# Patient Record
Sex: Female | Born: 1960 | Race: Black or African American | Hispanic: No | Marital: Single | Smoking: Never smoker
Health system: Southern US, Community
[De-identification: ages and names within clinical notes are randomized; demographics above are authoritative.]

## PROBLEM LIST (undated history)

## (undated) ENCOUNTER — Emergency Department (HOSPITAL_COMMUNITY): Payer: Medicaid Other

## (undated) DIAGNOSIS — D649 Anemia, unspecified: Secondary | ICD-10-CM

## (undated) DIAGNOSIS — F259 Schizoaffective disorder, unspecified: Secondary | ICD-10-CM

## (undated) DIAGNOSIS — F411 Generalized anxiety disorder: Secondary | ICD-10-CM

## (undated) DIAGNOSIS — E870 Hyperosmolality and hypernatremia: Secondary | ICD-10-CM

## (undated) DIAGNOSIS — M6281 Muscle weakness (generalized): Secondary | ICD-10-CM

## (undated) DIAGNOSIS — N183 Chronic kidney disease, stage 3 unspecified: Secondary | ICD-10-CM

## (undated) DIAGNOSIS — I10 Essential (primary) hypertension: Secondary | ICD-10-CM

## (undated) HISTORY — DX: Chronic kidney disease, stage 3 unspecified: N18.30

## (undated) HISTORY — DX: Muscle weakness (generalized): M62.81

## (undated) HISTORY — DX: Hyperosmolality and hypernatremia: E87.0

## (undated) HISTORY — DX: Anemia, unspecified: D64.9

## (undated) HISTORY — DX: Chronic kidney disease, stage 3 (moderate): N18.3

## (undated) HISTORY — DX: Generalized anxiety disorder: F41.1

## (undated) HISTORY — PX: NO PAST SURGERIES: SHX2092

---

## 1998-03-29 ENCOUNTER — Other Ambulatory Visit: Admission: RE | Admit: 1998-03-29 | Discharge: 1998-03-29 | Payer: Self-pay | Admitting: Internal Medicine

## 1998-05-10 ENCOUNTER — Other Ambulatory Visit: Admission: RE | Admit: 1998-05-10 | Discharge: 1998-05-10 | Payer: Self-pay | Admitting: Internal Medicine

## 2001-12-14 ENCOUNTER — Emergency Department (HOSPITAL_COMMUNITY): Admission: EM | Admit: 2001-12-14 | Discharge: 2001-12-14 | Payer: Self-pay | Admitting: Emergency Medicine

## 2012-08-22 ENCOUNTER — Encounter (HOSPITAL_COMMUNITY): Payer: Self-pay | Admitting: Emergency Medicine

## 2012-08-22 ENCOUNTER — Emergency Department (HOSPITAL_COMMUNITY)
Admission: EM | Admit: 2012-08-22 | Discharge: 2012-08-22 | Disposition: A | Discharge status: Home / Self Care | Payer: Medicaid Other | Source: Home / Self Care | Attending: Emergency Medicine | Admitting: Emergency Medicine

## 2012-08-22 DIAGNOSIS — H60543 Acute eczematoid otitis externa, bilateral: Secondary | ICD-10-CM

## 2012-08-22 DIAGNOSIS — H6123 Impacted cerumen, bilateral: Secondary | ICD-10-CM

## 2012-08-22 DIAGNOSIS — H60509 Unspecified acute noninfective otitis externa, unspecified ear: Secondary | ICD-10-CM

## 2012-08-22 HISTORY — DX: Schizoaffective disorder, unspecified: F25.9

## 2012-08-22 HISTORY — DX: Essential (primary) hypertension: I10

## 2012-08-22 MED ORDER — TRIAMCINOLONE ACETONIDE 0.1 % EX CREA: TOPICAL_CREAM | Freq: Two times a day (BID) | CUTANEOUS | Status: DC

## 2012-08-22 NOTE — ED Provider Notes (Signed)
History     CSN: [id number]  Arrival date & time 08/22/12  1003   First MD Initiated Contact with Patient 08/22/12 1022      Chief Complaint  Patient presents with  . Cerumen Impaction    (Consider location/radiation/quality/duration/timing/severity/associated sxs/prior treatment) HPI Comments: 52 year old female presents with decreased hearing she states is due to buildup of wax in her ear. This is been going on for one year. She denies earache, sore throat, headache, neck or facial pain, fever or other symptoms. When asked about the scaling on her ear she states that they do itch and she does not know the reason for her ears itching.   Past Medical History  Diagnosis Date  . Hypertension   . Schizoaffective disorder     History reviewed. No pertinent past surgical history.  No family history on file.  History  Substance Use Topics  . Smoking status: Never Smoker   . Smokeless tobacco: Not on file  . Alcohol Use: No    OB History   Grav Para Term Preterm Abortions TAB SAB Ect Mult Living                  Review of Systems  HENT: Negative for ear pain, congestion, sore throat, facial swelling, rhinorrhea, mouth sores, neck pain, postnasal drip, sinus pressure and ear discharge.   Skin: Positive for rash.       As described above in regards to the ears.  All other systems reviewed and are negative.    Allergies  Review of patient's allergies indicates no known allergies.  Home Medications   Current Outpatient Rx  Name  Route  Sig  Dispense  Refill  . hydrochlorothiazide (HYDRODIURIL) 50 MG tablet   Oral   Take 50 mg by mouth daily.         Marland Kitchen lithium 300 MG tablet   Oral   Take 300 mg by mouth 3 (three) times daily.         . risperidone (RISPERDAL) 4 MG tablet   Oral   Take 4 mg by mouth 2 (two) times daily.         Marland Kitchen triamcinolone cream (KENALOG) 0.1 %   Topical   Apply topically 2 (two) times daily. Apply for 2 weeks. May use on face   30  g   0     BP 148/99  Pulse 96  Temp(Src) 98.1 F (36.7 C) (Oral)  SpO2 100%  Physical Exam  Nursing note and vitals reviewed. Constitutional: She is oriented to person, place, and time. She appears well-developed and well-nourished. No distress.  HENT:  Head: Normocephalic and atraumatic.  Nose: Nose normal.  Mouth/Throat: Oropharynx is clear and moist. No oropharyngeal exudate.  Thick, gray, dry scaling and desquamation of the dermis involving the outer ear and  ear canal meatus. Similar well marginated lesions are behind both ears. They occur in no other areas. Unable to visualize the TMs due to epidermal and cerumen  debris in the EACs.   Neck: Normal range of motion. Neck supple.  Pulmonary/Chest: Effort normal.  Musculoskeletal: She exhibits no edema and no tenderness.  Lymphadenopathy:    She has no cervical adenopathy.  Neurological: She is alert and oriented to person, place, and time. She exhibits normal muscle tone.  Skin: Skin is warm and dry. Rash noted.  Rash as described above of the outer ears.  Psychiatric: She has a normal mood and affect.    ED Course  Procedures (  including critical care time)  Labs Reviewed - No data to display No results found.   1. Cerumen impaction, bilateral   2. Eczema of external ear, bilateral       MDM  Irrigated both ears with warm water. After copious amounts of irrigation most of the debris was removed. The patient states she can hear better her ear canals were beginning to become irritated so we stopped the process. The TMs are still not well visualized. Hopefully the triamcinolone cream will help reduce the debris associated with the eczema flaking. She may also use a wax softener to assist in removal of ear wax. Triamcinolone cream applied to the areas of the ear affected by eczema. Follow up with your doctor on your Medicaid card for health maintenance, blood pressure, medications and rash in the  ears.        Hayden Rasmussen, NP 08/22/12 1122

## 2012-08-22 NOTE — ED Provider Notes (Signed)
Medical screening examination/treatment/procedure(s) were performed by non-physician practitioner and as supervising physician I was immediately available for consultation/collaboration.  Nichols Corter, M.D.  Saxon Crosby C Cordarrius Coad, MD 08/22/12 2100 

## 2012-08-22 NOTE — ED Notes (Signed)
Reports wax in ears.  " can barely hear".  Reports this issue has been going on for a year.

## 2013-04-14 ENCOUNTER — Encounter (HOSPITAL_COMMUNITY): Payer: Self-pay | Admitting: Emergency Medicine

## 2013-04-14 ENCOUNTER — Emergency Department (HOSPITAL_COMMUNITY)
Admission: EM | Admit: 2013-04-14 | Discharge: 2013-04-14 | Disposition: A | Discharge status: Home / Self Care | Payer: Medicaid Other | Source: Home / Self Care | Attending: Emergency Medicine | Admitting: Emergency Medicine

## 2013-04-14 DIAGNOSIS — H6120 Impacted cerumen, unspecified ear: Secondary | ICD-10-CM

## 2013-04-14 DIAGNOSIS — H6123 Impacted cerumen, bilateral: Secondary | ICD-10-CM

## 2013-04-14 NOTE — ED Provider Notes (Signed)
CSN: [id number]     Arrival date & time 04/14/13  0845 History   First MD Initiated Contact with Patient 04/14/13 0919     Chief Complaint  Patient presents with  . Cerumen Impaction   (Consider location/radiation/quality/duration/timing/severity/associated sxs/prior Treatment) Patient is a 52 y.o. female presenting with plugged ear sensation. The history is provided by the patient. No language interpreter was used.  Ear Fullness This is a recurrent problem. The problem occurs constantly. Nothing aggravates the symptoms. Nothing relieves the symptoms. She has tried nothing for the symptoms.   Pt complains of ears being full off wax Past Medical History  Diagnosis Date  . Hypertension   . Schizoaffective disorder    History reviewed. No pertinent past surgical history. History reviewed. No pertinent family history. History  Substance Use Topics  . Smoking status: Never Smoker   . Smokeless tobacco: Not on file  . Alcohol Use: No   OB History   Grav Para Term Preterm Abortions TAB SAB Ect Mult Living                 Review of Systems  HENT: Positive for ear pain and hearing loss.   All other systems reviewed and are negative.    Allergies  Review of patient's allergies indicates no known allergies.  Home Medications   Current Outpatient Rx  Name  Route  Sig  Dispense  Refill  . hydrochlorothiazide (HYDRODIURIL) 50 MG tablet   Oral   Take 50 mg by mouth daily.         Marland Kitchen lithium 300 MG tablet   Oral   Take 300 mg by mouth 3 (three) times daily.         . risperidone (RISPERDAL) 4 MG tablet   Oral   Take 4 mg by mouth 2 (two) times daily.         Marland Kitchen triamcinolone cream (KENALOG) 0.1 %   Topical   Apply topically 2 (two) times daily. Apply for 2 weeks. May use on face   30 g   0    BP 156/113  Pulse 96  Temp(Src) 98.6 F (37 C) (Oral)  Resp 16  SpO2 98% Physical Exam  Nursing note and vitals reviewed. Constitutional: She is oriented to person,  place, and time. She appears well-developed and well-nourished.  HENT:  Head: Normocephalic and atraumatic.  bilat Tm's occluded with wax  Eyes: Pupils are equal, round, and reactive to light.  Neck: Normal range of motion.  Pulmonary/Chest: Effort normal.  Musculoskeletal: Normal range of motion.  Neurological: She is alert and oriented to person, place, and time.  Skin: Skin is warm.  Psychiatric: She has a normal mood and affect.    ED Course  Procedures (including critical care time) Labs Review Labs Reviewed - No data to display Imaging Review No results found.  EKG Interpretation     Ventricular Rate:    PR Interval:    QRS Duration:   QT Interval:    QTC Calculation:   R Axis:     Text Interpretation:              MDM   1. Cerumen impaction, bilateral    Pt feels better and can hear after irrigation,   Tm's clear   Elson Areas, PA-C 04/14/13 [address] [place], [place] 04/14/13 1046

## 2013-04-14 NOTE — ED Notes (Signed)
C/o bilateral cerumen impaction

## 2013-04-16 NOTE — ED Provider Notes (Signed)
Medical screening examination/treatment/procedure(s) were performed by non-physician practitioner and as supervising physician I was immediately available for consultation/collaboration.  Leslee Home, M.D.  Reuben Likes, MD 04/16/13 1136

## 2013-09-25 ENCOUNTER — Encounter (HOSPITAL_COMMUNITY): Payer: Self-pay | Admitting: Emergency Medicine

## 2013-09-25 ENCOUNTER — Inpatient Hospital Stay (HOSPITAL_COMMUNITY)
Admission: EM | Admit: 2013-09-25 | Discharge: 2013-09-28 | DRG: 871 | Disposition: A | Discharge status: Home / Self Care | Payer: PRIVATE HEALTH INSURANCE | Attending: Internal Medicine | Admitting: Internal Medicine

## 2013-09-25 ENCOUNTER — Emergency Department (HOSPITAL_COMMUNITY): Payer: PRIVATE HEALTH INSURANCE

## 2013-09-25 DIAGNOSIS — I10 Essential (primary) hypertension: Secondary | ICD-10-CM | POA: Diagnosis present

## 2013-09-25 DIAGNOSIS — R631 Polydipsia: Secondary | ICD-10-CM | POA: Diagnosis present

## 2013-09-25 DIAGNOSIS — T56891A Toxic effect of other metals, accidental (unintentional), initial encounter: Secondary | ICD-10-CM

## 2013-09-25 DIAGNOSIS — N179 Acute kidney failure, unspecified: Secondary | ICD-10-CM | POA: Diagnosis present

## 2013-09-25 DIAGNOSIS — T50995A Adverse effect of other drugs, medicaments and biological substances, initial encounter: Secondary | ICD-10-CM | POA: Diagnosis present

## 2013-09-25 DIAGNOSIS — J90 Pleural effusion, not elsewhere classified: Secondary | ICD-10-CM | POA: Diagnosis present

## 2013-09-25 DIAGNOSIS — R918 Other nonspecific abnormal finding of lung field: Secondary | ICD-10-CM | POA: Diagnosis present

## 2013-09-25 DIAGNOSIS — I129 Hypertensive chronic kidney disease with stage 1 through stage 4 chronic kidney disease, or unspecified chronic kidney disease: Secondary | ICD-10-CM | POA: Diagnosis present

## 2013-09-25 DIAGNOSIS — R7889 Finding of other specified substances, not normally found in blood: Secondary | ICD-10-CM

## 2013-09-25 DIAGNOSIS — N251 Nephrogenic diabetes insipidus: Secondary | ICD-10-CM | POA: Diagnosis present

## 2013-09-25 DIAGNOSIS — T438X5A Adverse effect of other psychotropic drugs, initial encounter: Secondary | ICD-10-CM | POA: Diagnosis present

## 2013-09-25 DIAGNOSIS — Z8249 Family history of ischemic heart disease and other diseases of the circulatory system: Secondary | ICD-10-CM

## 2013-09-25 DIAGNOSIS — R7989 Other specified abnormal findings of blood chemistry: Secondary | ICD-10-CM | POA: Diagnosis present

## 2013-09-25 DIAGNOSIS — N189 Chronic kidney disease, unspecified: Secondary | ICD-10-CM | POA: Diagnosis present

## 2013-09-25 DIAGNOSIS — J189 Pneumonia, unspecified organism: Secondary | ICD-10-CM | POA: Diagnosis present

## 2013-09-25 DIAGNOSIS — R358 Other polyuria: Secondary | ICD-10-CM | POA: Diagnosis present

## 2013-09-25 DIAGNOSIS — R3589 Other polyuria: Secondary | ICD-10-CM | POA: Diagnosis present

## 2013-09-25 DIAGNOSIS — F258 Other schizoaffective disorders: Secondary | ICD-10-CM

## 2013-09-25 DIAGNOSIS — F259 Schizoaffective disorder, unspecified: Secondary | ICD-10-CM | POA: Diagnosis present

## 2013-09-25 DIAGNOSIS — A419 Sepsis, unspecified organism: Principal | ICD-10-CM | POA: Diagnosis present

## 2013-09-25 DIAGNOSIS — Z79899 Other long term (current) drug therapy: Secondary | ICD-10-CM

## 2013-09-25 LAB — COMPREHENSIVE METABOLIC PANEL
ALK PHOS: 126 U/L — AB (ref 39–117)
ALT: 11 U/L (ref 0–35)
AST: 15 U/L (ref 0–37)
Albumin: 4.9 g/dL (ref 3.5–5.2)
BILIRUBIN TOTAL: 0.4 mg/dL (ref 0.3–1.2)
BUN: 12 mg/dL (ref 6–23)
CHLORIDE: 103 meq/L (ref 96–112)
CO2: 27 mEq/L (ref 19–32)
Calcium: 12.2 mg/dL — ABNORMAL HIGH (ref 8.4–10.5)
Creatinine, Ser: 1.35 mg/dL — ABNORMAL HIGH (ref 0.50–1.10)
GFR, EST AFRICAN AMERICAN: 51 mL/min — AB (ref >90)
GFR, EST NON AFRICAN AMERICAN: 44 mL/min — AB (ref >90)
GLUCOSE: 82 mg/dL (ref 70–99)
POTASSIUM: 3.8 meq/L (ref 3.7–5.3)
Sodium: 143 mEq/L (ref 137–147)
TOTAL PROTEIN: 8.9 g/dL — AB (ref 6.0–8.3)

## 2013-09-25 LAB — URINE MICROSCOPIC-ADD ON

## 2013-09-25 LAB — CBC WITH DIFFERENTIAL/PLATELET
Basophils Absolute: 0.1 10*3/uL (ref 0.0–0.1)
Basophils Relative: 0 % (ref 0–1)
EOS ABS: 0.3 10*3/uL (ref 0.0–0.7)
Eosinophils Relative: 2 % (ref 0–5)
HCT: 36 % (ref 36.0–46.0)
HEMOGLOBIN: 11.5 g/dL — AB (ref 12.0–15.0)
LYMPHS ABS: 2.4 10*3/uL (ref 0.7–4.0)
Lymphocytes Relative: 16 % (ref 12–46)
MCH: 25.8 pg — AB (ref 26.0–34.0)
MCHC: 31.9 g/dL (ref 30.0–36.0)
MCV: 80.9 fL (ref 78.0–100.0)
MONOS PCT: 6 % (ref 3–12)
Monocytes Absolute: 0.9 10*3/uL (ref 0.1–1.0)
NEUTROS PCT: 75 % (ref 43–77)
Neutro Abs: 11.1 10*3/uL — ABNORMAL HIGH (ref 1.7–7.7)
Platelets: 355 10*3/uL (ref 150–400)
RBC: 4.45 MIL/uL (ref 3.87–5.11)
RDW: 13.7 % (ref 11.5–15.5)
WBC: 14.8 10*3/uL — ABNORMAL HIGH (ref 4.0–10.5)

## 2013-09-25 LAB — URINALYSIS, ROUTINE W REFLEX MICROSCOPIC
BILIRUBIN URINE: NEGATIVE
Glucose, UA: NEGATIVE mg/dL
HGB URINE DIPSTICK: NEGATIVE
Ketones, ur: NEGATIVE mg/dL
Nitrite: NEGATIVE
PROTEIN: NEGATIVE mg/dL
Specific Gravity, Urine: 1.005 (ref 1.005–1.030)
UROBILINOGEN UA: 0.2 mg/dL (ref 0.0–1.0)
pH: 6.5 (ref 5.0–8.0)

## 2013-09-25 LAB — MAGNESIUM: Magnesium: 2.7 mg/dL — ABNORMAL HIGH (ref 1.5–2.5)

## 2013-09-25 LAB — PHOSPHORUS: Phosphorus: 3.1 mg/dL (ref 2.3–4.6)

## 2013-09-25 LAB — SALICYLATE LEVEL: Salicylate Lvl: <2 mg/dL — ABNORMAL LOW (ref 2.8–20.0)

## 2013-09-25 LAB — LITHIUM LEVEL: Lithium Lvl: 2.3 mEq/L (ref 0.80–1.40)

## 2013-09-25 LAB — I-STAT TROPONIN, ED: Troponin i, poc: 0 ng/mL (ref 0.00–0.08)

## 2013-09-25 LAB — ACETAMINOPHEN LEVEL

## 2013-09-25 MED ORDER — HYDROXYZINE HCL 50 MG PO TABS
50.0000 mg | ORAL_TABLET | Freq: Every day | ORAL | Status: DC
  Administered 2013-09-25 – 2013-09-27 (×3): 50 mg via ORAL
  Filled 2013-09-25 (×4): qty 1

## 2013-09-25 MED ORDER — VANCOMYCIN HCL 500 MG IV SOLR
500.0000 mg | Freq: Two times a day (BID) | INTRAVENOUS | Status: DC
  Administered 2013-09-25 – 2013-09-26 (×2): 500 mg via INTRAVENOUS
  Filled 2013-09-25 (×3): qty 500

## 2013-09-25 MED ORDER — SODIUM CHLORIDE 0.9 % IV BOLUS (SEPSIS)
1000.0000 mL | Freq: Once | INTRAVENOUS | Status: AC
  Administered 2013-09-25: 1000 mL via INTRAVENOUS

## 2013-09-25 MED ORDER — CEFEPIME HCL 1 G IJ SOLR: 1.0000 g | Freq: Two times a day (BID) | INTRAMUSCULAR | Status: DC

## 2013-09-25 MED ORDER — INDAPAMIDE 1.25 MG PO TABS
1.2500 mg | ORAL_TABLET | Freq: Every day | ORAL | Status: DC
  Administered 2013-09-25 – 2013-09-26 (×2): 1.25 mg via ORAL
  Filled 2013-09-25 (×2): qty 1

## 2013-09-25 MED ORDER — SODIUM CHLORIDE 0.9 % IJ SOLN
10.0000 mL | INTRAMUSCULAR | Status: DC | PRN
  Administered 2013-09-26 – 2013-09-27 (×2): 10 mL

## 2013-09-25 MED ORDER — RISPERIDONE 2 MG PO TABS
4.0000 mg | ORAL_TABLET | Freq: Every day | ORAL | Status: DC
  Administered 2013-09-25 – 2013-09-27 (×3): 4 mg via ORAL
  Filled 2013-09-25 (×4): qty 2

## 2013-09-25 MED ORDER — RISPERIDONE 2 MG PO TABS
2.0000 mg | ORAL_TABLET | Freq: Two times a day (BID) | ORAL | Status: DC
  Filled 2013-09-25: qty 3

## 2013-09-25 MED ORDER — DEXTROSE 5 % IV SOLN
1.0000 g | Freq: Two times a day (BID) | INTRAVENOUS | Status: DC
  Administered 2013-09-25 – 2013-09-26 (×2): 1 g via INTRAVENOUS
  Filled 2013-09-25 (×4): qty 1

## 2013-09-25 MED ORDER — HEPARIN SODIUM (PORCINE) 5000 UNIT/ML IJ SOLN
5000.0000 [IU] | Freq: Three times a day (TID) | INTRAMUSCULAR | Status: DC
  Administered 2013-09-25 – 2013-09-28 (×8): 5000 [IU] via SUBCUTANEOUS
  Filled 2013-09-25 (×11): qty 1

## 2013-09-25 MED ORDER — DEXTROSE 5 % IV SOLN: 1.0000 g | Freq: Three times a day (TID) | INTRAVENOUS | Status: DC

## 2013-09-25 MED ORDER — DEXTROSE 5 % IV SOLN
1.0000 g | Freq: Once | INTRAVENOUS | Status: DC
  Filled 2013-09-25: qty 1

## 2013-09-25 MED ORDER — RISPERIDONE 2 MG PO TABS
2.0000 mg | ORAL_TABLET | Freq: Every day | ORAL | Status: DC
  Administered 2013-09-26 – 2013-09-28 (×3): 2 mg via ORAL
  Filled 2013-09-25 (×3): qty 1

## 2013-09-25 MED ORDER — SODIUM CHLORIDE 0.9 % IV SOLN
INTRAVENOUS | Status: DC
  Administered 2013-09-25: 23:00:00 via INTRAVENOUS
  Administered 2013-09-26: 100 mL/h via INTRAVENOUS

## 2013-09-25 MED ORDER — VANCOMYCIN HCL 500 MG IV SOLR: 500.0000 mg | Freq: Two times a day (BID) | INTRAVENOUS | Status: DC

## 2013-09-25 MED ORDER — VANCOMYCIN HCL IN DEXTROSE 1-5 GM/200ML-% IV SOLN: 1000.0000 mg | INTRAVENOUS | Status: DC

## 2013-09-25 NOTE — Progress Notes (Signed)
Peripherally Inserted Central Catheter/Midline Placement  The IV Nurse has discussed with the patient and/or persons authorized to consent for the patient, the purpose of this procedure and the potential benefits and risks involved with this procedure.  The benefits include less needle sticks, lab draws from the catheter and patient may be discharged home with the catheter.  Risks include, but not limited to, infection, bleeding, blood clot (thrombus formation), and puncture of an artery; nerve damage and irregular heat beat.  Alternatives to this procedure were also discussed.  PICC/Midline Placement Documentation        Tara Hurst 09/25/2013, 8:01 PM

## 2013-09-25 NOTE — ED Notes (Signed)
IV Team in the room.

## 2013-09-25 NOTE — ED Provider Notes (Signed)
CSN: [id number]     Arrival date & time 09/25/13  1125 History   This chart was scribed for non-physician practitioner Montine Circle, PA-C, working with Jasper Riling. Alvino Chapel, MD, by Neta Ehlers, ED Scribe. This patient was seen in room [facility]3/[facility]3 and the patient's care was started at 12:43 PM.  First MD Initiated Contact with Patient 09/25/13 1153     Chief Complaint  Patient presents with  . Medical Clearance   The history is provided by the patient. No language interpreter was used.   HPI Comments: Tara Hurst is a 53 y.o. female, with a h/o Schizoaffective disorder, presenting to the Emergency Department for  medical clearance. She was sent from [facility] to have her lithium levels assessed. She reports increased drooling and heavy breathing; she states these are new symptoms. She also reports a cough and rhinorrhea. She denies SI and HI. Additionally, she denies emesis, diarrhea, and constipation.    Past Medical History  Diagnosis Date  . Hypertension   . Schizoaffective disorder    History reviewed. No pertinent past surgical history. No family history on file. History  Substance Use Topics  . Smoking status: Never Smoker   . Smokeless tobacco: Not on file  . Alcohol Use: No   No OB history provided.  Review of Systems  A complete 10 system review of systems was obtained, and all systems were negative except where indicated in the HPI and PE.    Allergies  Review of patient's allergies indicates no known allergies.  Home Medications   Current Outpatient Rx  Name  Route  Sig  Dispense  Refill  . hydrochlorothiazide (HYDRODIURIL) 50 MG tablet   Oral   Take 50 mg by mouth daily.         Marland Kitchen lithium 300 MG tablet   Oral   Take 300 mg by mouth 3 (three) times daily.         . risperidone (RISPERDAL) 4 MG tablet   Oral   Take 4 mg by mouth 2 (two) times daily.         Marland Kitchen triamcinolone cream (KENALOG) 0.1 %   Topical   Apply topically 2 (two) times daily.  Apply for 2 weeks. May use on face   30 g   0    Triage Vitals: BP 146/100  Pulse 98  Temp(Src) 98.7 F (37.1 C) (Oral)  Resp 18  SpO2 98%  Physical Exam  Nursing note and vitals reviewed. Constitutional: She is oriented to person, place, and time. She appears well-developed and well-nourished. No distress.  HENT:  Head: Normocephalic and atraumatic.  Eyes: EOM are normal.  Neck: Neck supple. No tracheal deviation present.  Cardiovascular: Normal rate, regular rhythm and normal heart sounds.  Exam reveals no gallop and no friction rub.   No murmur heard. Pulmonary/Chest: Effort normal and breath sounds normal. No stridor. No respiratory distress. She has no wheezes. She has no rales. She exhibits no tenderness.  Abdominal: She exhibits no distension.  Musculoskeletal: Normal range of motion.  Neurological: She is alert and oriented to person, place, and time.  Skin: Skin is warm and dry.  Psychiatric: Her behavior is normal.  Anxious.     ED Course  Procedures (including critical care time)  DIAGNOSTIC STUDIES: Oxygen Saturation is 98% on room air, normal by my interpretation.    COORDINATION OF CARE:  12:46 PM- Discussed treatment plan with patient, and the patient agreed to the plan.   Labs Review Labs  Reviewed - No data to display Imaging Review Dg Chest 2 View  09/25/2013   CLINICAL DATA:  Chest pain.  EXAM: CHEST  2 VIEW  COMPARISON:  None.  FINDINGS: The heart size and mediastinal contours are within normal limits. Mild area of increased density left lung base superior blunting of left costophrenic angle. Bilateral nipple shadows appreciated. Osseous structures unremarkable.  IMPRESSION: Left lower lobe infiltrate and trace effusion.   Electronically Signed   By: Margaree Mackintosh M.D.   On: 09/25/2013 13:19     EKG Interpretation None      MDM   Final diagnoses:  HCAP (healthcare-associated pneumonia)  Lithium toxicity    Patient with cough and shortness  of breath one month. She also feels like her lithium level is high. Will check labs, chest x-ray, and will reevaluate.  Chest x-ray remarkable for pneumonia. Will treat for HCAP. Discussed patient with Dr. Alvino Chapel, who agrees the patient should be admitted. Will also start some fluids for high lithium level.  3:09 PM Thanks to Dr. Wendee Beavers from [facility], who will admit the patient.  Will start abx and get blood cultures in the ED.  I personally performed the services described in this documentation, which was scribed in my presence. The recorded information has been reviewed and is accurate.    Montine Circle, PA-C 09/25/13 1510

## 2013-09-25 NOTE — ED Notes (Signed)
Per EMS: pt from [facility], feels as if her lithium levels are too high, Denies SI/HI.

## 2013-09-25 NOTE — Progress Notes (Addendum)
Patient received from ED.  Patient appears jittery, slurred speech, anxious because she cares for her mother with whom she lives and no one is at home caring for her at this time.  Patient states that she has trouble with her memory but states she gives her own consent for anything that needs to be done for her and her mother.  She answered all my questions appropriately.  She states she is unsure why she is here except that a person at [facility] sent her to the hospital thinking she may have an elevated lithium level  Multiple attempts were made in the ED to initiate IV therapy without success and a PICC has been ordered which patient is in agreement with and IV Team paged.  Instructed patient to use of call light, not to get out of bed without calling for assistance, and bed alarm activated.

## 2013-09-25 NOTE — H&P (Signed)
[facility] History and Physical  Tara Hurst BMW:[id number] DOB: 03/26/1961 DOA: 09/25/2013  Referring physician: PA: Lorre Munroe PCP: Elyn Peers, MD   Chief Complaint: confirmed PNA on chest x ray  HPI: Tara Hurst is a 53 y.o. female  With history of schizoaffective disorder and hypertension. Patient takes lithium at home and reportedly it was felt the patient's lithium levels were elevated as such she was transferred to the ED for further evaluation and recommendations. On further evaluation patient was found to have pneumonia on chest x-ray. The patient states that for the last 2-3 weeks she has been having shortness of breath with productive cough. Nothing she is aware of makes it better or worse. Since onset symptoms of been persistent and not getting any better.  Given persistence symptoms and confirmed pneumonia on chest x-ray as well as elevated lithium levels were consulted for further evaluation recommendations   Review of Systems:  Unable to accurately assess this patient's poor historian  Past Medical History  Diagnosis Date  . Hypertension   . Schizoaffective disorder    History reviewed. No pertinent past surgical history. Social History:  reports that she has never smoked. She has never used smokeless tobacco. She reports that she does not drink alcohol or use illicit drugs.  No Known Allergies  Family History  Problem Relation Age of Onset  . Heart disease Mother   . Diabetic kidney disease Mother      Prior to Admission medications   Medication Sig Start Date End Date Taking? Authorizing Provider  hydrOXYzine (VISTARIL) 50 MG capsule Take 50 mg by mouth at bedtime.   Yes Historical Provider, MD  indapamide (LOZOL) 1.25 MG tablet Take 1.25 mg by mouth daily.   Yes Historical Provider, MD  lithium 300 MG tablet Take 300-600 mg by mouth 2 (two) times daily. 600mg  in the morning and 300mg  at night   Yes Historical Provider, MD  risperiDONE  (RISPERDAL) 2 MG tablet Take 2-6 mg by mouth 2 (two) times daily.   Yes Historical Provider, MD   Physical Exam: Filed Vitals:   09/25/13 1130  BP: 146/100  Pulse: 98  Temp: 98.7 F (37.1 C)  Resp: 18    BP 146/100  Pulse 98  Temp(Src) 98.7 F (37.1 C) (Oral)  Resp 18  Ht 5\' 4"  (1.626 m)  Wt 69.491 kg (153 lb 3.2 oz)  BMI 26.28 kg/m2  SpO2 98%  General:  Appears calm and comfortable Eyes: PERRL, normal lids, irises & conjunctiva ENT: grossly normal hearing, lips & tongue Neck: no LAD, masses or thyromegaly Cardiovascular: RRR, no m/r/g. No LE edema. Telemetry: SR, no arrhythmias  Respiratory: CTA bilaterally, no w/r/r. Normal respiratory effort. Abdomen: soft, nt, nd Skin: no rash or induration seen on limited exam Musculoskeletal: grossly normal tone BUE/BLE Psychiatric: Mood and affect appropriate Neurologic: No facial asymmetry, moves extremities equally           Labs on Admission:  Basic Metabolic Panel:  Recent Labs Lab 09/25/13 1325  NA 143  K 3.8  CL 103  CO2 27  GLUCOSE 82  BUN 12  CREATININE 1.35*  CALCIUM 12.2*   Liver Function Tests:  Recent Labs Lab 09/25/13 1325  AST 15  ALT 11  ALKPHOS 126*  BILITOT 0.4  PROT 8.9*  ALBUMIN 4.9   No results found for this basename: LIPASE, AMYLASE,  in the last 168 hours No results found for this basename: AMMONIA,  in the last 168 hours CBC:  Recent Labs Lab 09/25/13 1325  WBC 14.8*  NEUTROABS 11.1*  HGB 11.5*  HCT 36.0  MCV 80.9  PLT 355   Cardiac Enzymes: No results found for this basename: CKTOTAL, CKMB, CKMBINDEX, TROPONINI,  in the last 168 hours  BNP (last 3 results) No results found for this basename: PROBNP,  in the last 8760 hours CBG: No results found for this basename: GLUCAP,  in the last 168 hours  Radiological Exams on Admission: Dg Chest 2 View  09/25/2013   CLINICAL DATA:  Chest pain.  EXAM: CHEST  2 VIEW  COMPARISON:  None.  FINDINGS: The heart size and mediastinal  contours are within normal limits. Mild area of increased density left lung base superior blunting of left costophrenic angle. Bilateral nipple shadows appreciated. Osseous structures unremarkable.  IMPRESSION: Left lower lobe infiltrate and trace effusion.   Electronically Signed   By: Margaree Mackintosh M.D.   On: 09/25/2013 13:19    EKG: Independently reviewed. Sinus rhythm with no ST elevations or depressions.  Assessment/Plan Principal Problem:   HCAP (healthcare-associated pneumonia) - Pneumonia order set placed please review for details. -Will cover with broad-spectrum IV (vancomycin and cefepime) antibiotics and treat for HCAP given the patient came from nursing home. - Sputum culture and blood culture - Was diagnosed via 2 view chest x-ray which reported left lower lobe infiltrate and trace effusion.  Active Problems:   Schizoaffective disorder - Stable continue home regimen but hold lithium    Hypertension -Patient is not on any antihypertensive medication based on EMR reviewed. We'll place on heart healthy diet and monitor blood pressures. Consider adding antihypertensive medication should blood pressures remain elevated.    Abnormal lithium level in blood - Lithium level elevated. We'll plan on holding lithium and administering IV fluids. We'll reassess lithium levels next a.m. -Neurochecks every 4 hours   Code Status: full code Family Communication: *None at bedside. Disposition Plan: Med/surg with neurochecks.  Time spent: > 55 minutes  Velvet Bathe [facility] Pager [phone]

## 2013-09-25 NOTE — ED Notes (Signed)
Two IV attempts by Lum Babe and two IV attempts by this RN. Mini lab notified and verbalizes will attempt blood culture collect. IV team paged.

## 2013-09-25 NOTE — ED Notes (Signed)
IV team at bedside 

## 2013-09-25 NOTE — Progress Notes (Addendum)
ANTIBIOTIC CONSULT NOTE - INITIAL  Pharmacy Consult for vancomycin, cefepime Indication: HCAP  No Known Allergies  Patient Measurements: Height: 5\' 4"  (162.6 cm) Weight: 153 lb 3.2 oz (69.491 kg) IBW/kg (Calculated) : 54.7  Vital Signs: Temp: 98.7 F (37.1 C) (04/02 1130) Temp src: Oral (04/02 1130) BP: 146/100 mmHg (04/02 1130) Pulse Rate: 98 (04/02 1130) Intake/Output from previous day:   Intake/Output from this shift:    Labs:  Recent Labs  09/25/13 1325  WBC 14.8*  HGB 11.5*  PLT 355  CREATININE 1.35*   Estimated Creatinine Clearance: 46.6 ml/min (by C-G formula based on Cr of 1.35). No results found for this basename: VANCOTROUGH, VANCOPEAK, VANCORANDOM, GENTTROUGH, GENTPEAK, GENTRANDOM, TOBRATROUGH, TOBRAPEAK, TOBRARND, AMIKACINPEAK, AMIKACINTROU, AMIKACIN,  in the last 72 hours   Microbiology: No results found for this or any previous visit (from the past 720 hour(s)).  Medical History: Past Medical History  Diagnosis Date  . Hypertension   . Schizoaffective disorder     Medications:  Scheduled:   Infusions:  . ceFEPime (MAXIPIME) IV    . sodium chloride    . vancomycin     Assessment: 53 yo presented to ER for medical clearance with hx schizoaffective disorder. She was sent from [facility] to have lithium levels assessed and were found to be supratherapeutic at 2.3. Patient also SOB and was found to have pneumonia. To start vancomycin and cefepime for treatment of HCAP. Baseline labs: afebrile, WBC 14.8, SCr 1.35 with est CrCl 47 ml/min. Note that first doses of antibiotics have not been started yet because of trouble starting IV line - IV team has been paged.  Goal of Therapy:  Vancomycin trough level 15-20 mcg/ml  Plan:  1) Vancomycin 500mg  IV q12   2) Cefepime 1g IV q12 for CrCl 30-60 3) Will follow up when IV access started and retime doses as appropriate   Adrian Saran, PharmD, BCPS Pager [phone] 09/25/2013 3:58 PM

## 2013-09-26 ENCOUNTER — Inpatient Hospital Stay (HOSPITAL_COMMUNITY): Payer: PRIVATE HEALTH INSURANCE

## 2013-09-26 DIAGNOSIS — R7989 Other specified abnormal findings of blood chemistry: Secondary | ICD-10-CM

## 2013-09-26 DIAGNOSIS — F259 Schizoaffective disorder, unspecified: Secondary | ICD-10-CM

## 2013-09-26 DIAGNOSIS — I10 Essential (primary) hypertension: Secondary | ICD-10-CM

## 2013-09-26 DIAGNOSIS — T65891A Toxic effect of other specified substances, accidental (unintentional), initial encounter: Secondary | ICD-10-CM

## 2013-09-26 DIAGNOSIS — T56894A Toxic effect of other metals, undetermined, initial encounter: Secondary | ICD-10-CM

## 2013-09-26 LAB — BASIC METABOLIC PANEL
BUN: 15 mg/dL (ref 6–23)
CALCIUM: 11.6 mg/dL — AB (ref 8.4–10.5)
CHLORIDE: 118 meq/L — AB (ref 96–112)
CO2: 29 mEq/L (ref 19–32)
Creatinine, Ser: 1.41 mg/dL — ABNORMAL HIGH (ref 0.50–1.10)
GFR calc Af Amer: 49 mL/min — ABNORMAL LOW (ref >90)
GFR calc non Af Amer: 42 mL/min — ABNORMAL LOW (ref >90)
GLUCOSE: 112 mg/dL — AB (ref 70–99)
POTASSIUM: 4.5 meq/L (ref 3.7–5.3)
SODIUM: 155 meq/L — AB (ref 137–147)

## 2013-09-26 LAB — LEGIONELLA ANTIGEN, URINE: LEGIONELLA ANTIGEN, URINE: NEGATIVE

## 2013-09-26 LAB — CBC
HCT: 31.1 % — ABNORMAL LOW (ref 36.0–46.0)
HEMOGLOBIN: 9.9 g/dL — AB (ref 12.0–15.0)
MCH: 25.6 pg — ABNORMAL LOW (ref 26.0–34.0)
MCHC: 31.8 g/dL (ref 30.0–36.0)
MCV: 80.4 fL (ref 78.0–100.0)
Platelets: 311 10*3/uL (ref 150–400)
RBC: 3.87 MIL/uL (ref 3.87–5.11)
RDW: 13.9 % (ref 11.5–15.5)
WBC: 10.9 10*3/uL — ABNORMAL HIGH (ref 4.0–10.5)

## 2013-09-26 LAB — SODIUM, URINE, RANDOM: Sodium, Ur: 41 meq/L

## 2013-09-26 LAB — HIV ANTIBODY (ROUTINE TESTING W REFLEX): HIV: NONREACTIVE

## 2013-09-26 LAB — STREP PNEUMONIAE URINARY ANTIGEN: Strep Pneumo Urinary Antigen: NEGATIVE

## 2013-09-26 LAB — LITHIUM LEVEL: LITHIUM LVL: 1.54 meq/L — AB (ref 0.80–1.40)

## 2013-09-26 MED ORDER — LEVOFLOXACIN IN D5W 750 MG/150ML IV SOLN
750.0000 mg | INTRAVENOUS | Status: DC
  Administered 2013-09-26: 750 mg via INTRAVENOUS
  Filled 2013-09-26 (×2): qty 150

## 2013-09-26 MED ORDER — DIPHENHYDRAMINE HCL 25 MG PO CAPS
25.0000 mg | ORAL_CAPSULE | Freq: Two times a day (BID) | ORAL | Status: DC | PRN
  Administered 2013-09-26: 25 mg via ORAL
  Filled 2013-09-26: qty 1

## 2013-09-26 MED ORDER — BENZTROPINE MESYLATE 0.5 MG PO TABS
0.5000 mg | ORAL_TABLET | Freq: Two times a day (BID) | ORAL | Status: DC
  Administered 2013-09-26 – 2013-09-28 (×4): 0.5 mg via ORAL
  Filled 2013-09-26 (×5): qty 1

## 2013-09-26 MED ORDER — SODIUM CHLORIDE 0.45 % IV SOLN
INTRAVENOUS | Status: DC
  Administered 2013-09-26 – 2013-09-27 (×2): via INTRAVENOUS

## 2013-09-26 NOTE — Progress Notes (Addendum)
[facility] PROGRESS NOTE  Tara Hurst XVQ:[id number] DOB: Jun 26, 1961 DOA: 09/25/2013 PCP: Elyn Peers, MD  Assessment/Plan:  53 y.o. female with history of schizoaffective disorder and hypertension is admitted with PNA, ? DI  1. Pneumonia/sirs/sepsis/leukocytosis; CXR: Left lower lobe infiltrate and trace effusion -afebrile; cotn IV atx, IVF, oxygen, bronchodilators prn   2. Lithium toxicity suspicious DI; has polyuria, polydipsia; ? Nephrogenic DI with lithium -check urin osm, sodium, serum osm; strict I/O, urine output, ? desmopressin if not response; check renal US -cont IVF  3. AKI on ? CKD with lithium toxicity; cont IVF; f/u labs; hold indapamide;   4. Schizoaffective disorder on lithium, respirdal; consulted psychiatry; we stopped lithium due to DI;  -need medication reevaluation    Code Status: full Family Communication: d/w patient, called updated Dapper,Patricia Mother [phone]  (indicate person spoken with, relationship, and if by phone, the number) Disposition Plan: pend clinical improvement    Consultants:  None   Procedures:  None   Antibiotics:  levofloxacin 4/3<<<<   (indicate start date, and stop date if known)  HPI/Subjective: alert  Objective: Filed Vitals:   09/26/13 0641  BP: 153/92  Pulse: 76  Temp: 97.7 F (36.5 C)  Resp: 20    Intake/Output Summary (Last 24 hours) at 09/26/13 1111 Last data filed at 09/26/13 0814  Gross per 24 hour  Intake 2693.33 ml  Output   4000 ml  Net -1306.67 ml   Filed Weights   09/25/13 1524  Weight: 69.491 kg (153 lb 3.2 oz)    Exam:   General:  alert  Cardiovascular: s1,s2 rrr  Respiratory: CTA BL  Abdomen: soft, nt,nd   Musculoskeletal: no LE edema   Data Reviewed: Basic Metabolic Panel:  Recent Labs Lab 09/25/13 1325 09/25/13 1822 09/26/13 0446  NA 143  --  155*  K 3.8  --  4.5  CL 103  --  118*  CO2 27  --  29  GLUCOSE 82  --  112*  BUN 12  --  15  CREATININE  1.35*  --  1.41*  CALCIUM 12.2*  --  11.6*  MG  --  2.7*  --   PHOS  --  3.1  --    Liver Function Tests:  Recent Labs Lab 09/25/13 1325  AST 15  ALT 11  ALKPHOS 126*  BILITOT 0.4  PROT 8.9*  ALBUMIN 4.9   No results found for this basename: LIPASE, AMYLASE,  in the last 168 hours No results found for this basename: AMMONIA,  in the last 168 hours CBC:  Recent Labs Lab 09/25/13 1325 09/26/13 0446  WBC 14.8* 10.9*  NEUTROABS 11.1*  --   HGB 11.5* 9.9*  HCT 36.0 31.1*  MCV 80.9 80.4  PLT 355 311   Cardiac Enzymes: No results found for this basename: CKTOTAL, CKMB, CKMBINDEX, TROPONINI,  in the last 168 hours BNP (last 3 results) No results found for this basename: PROBNP,  in the last 8760 hours CBG: No results found for this basename: GLUCAP,  in the last 168 hours  Recent Results (from the past 240 hour(s))  CULTURE, BLOOD (ROUTINE X 2)     Status: None   Collection Time    09/25/13  3:45 PM      Result Value Ref Range Status   Specimen Description BLOOD RIGHT HAND   Final   Special Requests BOTTLES DRAWN AEROBIC AND ANAEROBIC 5CC   Final   Culture  Setup Time     Final  Value: 09/25/2013 18:52     Performed at [facility]   Culture     Final   Value:        BLOOD CULTURE RECEIVED NO GROWTH TO DATE CULTURE WILL BE HELD FOR 5 DAYS BEFORE ISSUING A FINAL NEGATIVE REPORT     Performed at [facility]   Report Status PENDING   Incomplete  CULTURE, BLOOD (ROUTINE X 2)     Status: None   Collection Time    09/25/13  3:45 PM      Result Value Ref Range Status   Specimen Description BLOOD RIGHT HAND   Final   Special Requests BOTTLES DRAWN AEROBIC AND ANAEROBIC 5CC   Final   Culture  Setup Time     Final   Value: 09/25/2013 18:52     Performed at [facility]   Culture     Final   Value:        BLOOD CULTURE RECEIVED NO GROWTH TO DATE CULTURE WILL BE HELD FOR 5 DAYS BEFORE ISSUING A FINAL NEGATIVE REPORT     Performed at [facility]   Report Status PENDING   Incomplete     Studies: Dg Chest 2 View  09/25/2013   CLINICAL DATA:  Chest pain.  EXAM: CHEST  2 VIEW  COMPARISON:  None.  FINDINGS: The heart size and mediastinal contours are within normal limits. Mild area of increased density left lung base superior blunting of left costophrenic angle. Bilateral nipple shadows appreciated. Osseous structures unremarkable.  IMPRESSION: Left lower lobe infiltrate and trace effusion.   Electronically Signed   By: Margaree Mackintosh M.D.   On: 09/25/2013 13:19    Scheduled Meds: . ceFEPime (MAXIPIME) IV  1 g Intravenous Q12H  . heparin  5,000 Units Subcutaneous 3 times per day  . hydrOXYzine  50 mg Oral QHS  . indapamide  1.25 mg Oral Daily  . risperiDONE  2 mg Oral Daily  . risperiDONE  4 mg Oral QHS  . vancomycin  500 mg Intravenous Q12H   Continuous Infusions: . sodium chloride      Principal Problem:   HCAP (healthcare-associated pneumonia) Active Problems:   Schizoaffective disorder   Hypertension   Abnormal lithium level in blood    Time spent: >35 minutes     Kinnie Feil  [facility] Pager [phone]. If 7PM-7AM, please contact night-coverage at [email or web address], password [facility] 09/26/2013, 11:11 AM  LOS: 1 day

## 2013-09-26 NOTE — Progress Notes (Signed)
Clinical Social Work Department CLINICAL SOCIAL WORK PSYCHIATRY SERVICE LINE ASSESSMENT 09/26/2013  Patient:  Tara Hurst  Account:  [id number]  Admit Date:  09/25/2013  Clinical Social Worker:  Sindy Messing, LCSW  Date/Time:  09/26/2013 12:00 N Referred by:  Physician  Date referred:  09/26/2013 Reason for Referral  Psychosocial assessment   Presenting Symptoms/Problems (In the person's/family's own words):   Psych consulted due to assist with medication management.   Abuse/Neglect/Trauma History (check all that apply)  Denies history   Abuse/Neglect/Trauma Comments:   Psychiatric History (check all that apply)  Outpatient treatment   Psychiatric medications:  Risperdal 2-4 mg   Current Mental Health Hospitalizations/Previous Mental Health History:   Patient reports she has been diagnosed with schizophrenia. Patient reports she receives medication management through [facility] and she has community support that assists as well.   Current provider:   Jodene Nam and Date:   [place], [place]   Current Medications:   Scheduled Meds:      . heparin  5,000 Units Subcutaneous 3 times per day  . hydrOXYzine  50 mg Oral QHS  . levofloxacin (LEVAQUIN) IV  750 mg Intravenous Q48H  . risperiDONE  2 mg Oral Daily  . risperiDONE  4 mg Oral QHS        Continuous Infusions:      . sodium chloride 150 mL/hr at 09/26/13 1119          PRN Meds:.sodium chloride       Previous Impatient Admission/Date/Reason:   None reported   Emotional Health / Current Symptoms    Suicide/Self Harm  None reported   Suicide attempt in the past:   Patient denies any previous attempts. Patient denies any SI or HI.   Other harmful behavior:   None reported   Psychotic/Dissociative Symptoms  Auditory Hallucinations  Visual Hallucinations   Other Psychotic/Dissociative Symptoms:   Patient reports that at her baseline she continues to experience hallucinations. Patient reports that she usually  has VH but that AH are rare.    Attention/Behavioral Symptoms  Within Normal Limits   Other Attention / Behavioral Symptoms:   Patient engaged during assessment.    Cognitive Impairment  Within Normal Limits   Other Cognitive Impairment:   Patient alert and oriented.    Mood and Adjustment  Mood Congruent    Stress, Anxiety, Trauma, Any Recent Loss/Stressor  Other - See comment   Anxiety (frequency):   N/A   Phobia (specify):   N/A   Compulsive behavior (specify):   N/A   Obsessive behavior (specify):   N/A   Other:   Patient reports she cares for her mother and is worried about her being alone while she is in the hospital.   Substance Abuse/Use  None   SBIRT completed (please refer for detailed history):  N  Self-reported substance use:   Patient denies any substance use.   Urinary Drug Screen Completed:  N Alcohol level:   N/A    Environmental/Housing/Living Arrangement  Stable housing   Who is in the home:   Mom   Emergency contact:  Patricia-mom   Financial  Medicaid   Patient's Strengths and Goals (patient's own words):   Patient reports good relationship with mom. Patient is compliant with medications and outpatient appointments.   Clinical Social Worker's Interpretive Summary:   CSW received referral in order to complete psychosocial assessment. CSW reviewed chart and met with patient at bedside. CSW introduced myself and explained role.  Patient states she was at [facility] for her medication appointment and they felt she needed to go to the hospital. Patient reports she is feeling better and is aware that medications need to be adjusted. Patient lives at home with mom and struggles with fully participating in assessment because she is worried about her mom. Patient reports that she cooks and grocery shops for her mom. Patient asked that CSW call and check on mom.    CSW called mom who reports that patient is compliant with treatment and  requested resources for food until patient is DC. Mom is concerned about patient's wellbeing so CSW explained that hospital staff would call mom when patient is ready to DC.    Patient engaged in assessment and is compliant with treatment. Patient states that she will take all medications as prescribed. Patient thanked CSW for time.   Disposition:  Recommend Psych CSW continuing to support while in hospital   Kiel, Loiza [phone]

## 2013-09-26 NOTE — Consult Note (Signed)
Reason for Consult: Schizoaffective disorder and lithium toxicity - medication managment Referring Physician: Kinnie Feil, MD  Tara Hurst is an 53 y.o. female.  HPI: Tara Hurst is a 53 y.o. female  Patient was seen and chart reviewed and case is briefly discussed with the hospitalist. Patient has been suffering with chronic schizoaffective disorder since 1990s and has been receiving medication management. Patient currently taking her medication management from [facility]. Psychiatric consultation was requested for medication management of schizoaffective disorder and her lithium was discontinued secondary to symptoms of diabetes insipidus and her lithium toxicity on arrival.   Review of Systems: Patient has had tremors bilaterally but denies symptoms of depression anxiety and suicidality  Mental Status Examination: Patient appeared as per his stated age, and fairly groomed, and maintaining good eye contact. Patient has fine mood and affect was constricted. Patient has obvious shakes in both upper extremities secondary to lithium in the past and also possibly risperidone. She has normal rate, rhythm, and volume of speech. Her thought process is linear and goal directed. Patient has denied suicidal, homicidal ideations, intentions or plans. Patient has no evidence of auditory or visual hallucinations, delusions, and paranoia. Patient has fair insight judgment and impulse control.  Past Medical History  Diagnosis Date  . Hypertension   . Schizoaffective disorder     History reviewed. No pertinent past surgical history.  Family History  Problem Relation Age of Onset  . Heart disease Mother   . Diabetic kidney disease Mother     Social History:  reports that she has never smoked. She has never used smokeless tobacco. She reports that she does not drink alcohol or use illicit drugs.  Allergies: No Known Allergies  Medications: I have reviewed the patient's current  medications.  Results for orders placed during the hospital encounter of 09/25/13 (from the past 48 hour(s))  URINALYSIS, ROUTINE W REFLEX MICROSCOPIC     Status: Abnormal   Collection Time    09/25/13  1:22 PM      Result Value Ref Range   Color, Urine YELLOW  YELLOW   APPearance CLEAR  CLEAR   Specific Gravity, Urine 1.005  1.005 - 1.030   pH 6.5  5.0 - 8.0   Glucose, UA NEGATIVE  NEGATIVE mg/dL   Hgb urine dipstick NEGATIVE  NEGATIVE   Bilirubin Urine NEGATIVE  NEGATIVE   Ketones, ur NEGATIVE  NEGATIVE mg/dL   Protein, ur NEGATIVE  NEGATIVE mg/dL   Urobilinogen, UA 0.2  0.0 - 1.0 mg/dL   Nitrite NEGATIVE  NEGATIVE   Leukocytes, UA TRACE (*) NEGATIVE  URINE MICROSCOPIC-ADD ON     Status: None   Collection Time    09/25/13  1:22 PM      Result Value Ref Range   Squamous Epithelial / LPF RARE  RARE   WBC, UA 0-2  <3 WBC/hpf  CBC WITH DIFFERENTIAL     Status: Abnormal   Collection Time    09/25/13  1:25 PM      Result Value Ref Range   WBC 14.8 (*) 4.0 - 10.5 K/uL   RBC 4.45  3.87 - 5.11 MIL/uL   Hemoglobin 11.5 (*) 12.0 - 15.0 g/dL   HCT 36.0  36.0 - 46.0 %   MCV 80.9  78.0 - 100.0 fL   MCH 25.8 (*) 26.0 - 34.0 pg   MCHC 31.9  30.0 - 36.0 g/dL   RDW 13.7  11.5 - 15.5 %   Platelets 355  150 - 400 K/uL   Neutrophils Relative % 75  43 - 77 %   Neutro Abs 11.1 (*) 1.7 - 7.7 K/uL   Lymphocytes Relative 16  12 - 46 %   Lymphs Abs 2.4  0.7 - 4.0 K/uL   Monocytes Relative 6  3 - 12 %   Monocytes Absolute 0.9  0.1 - 1.0 K/uL   Eosinophils Relative 2  0 - 5 %   Eosinophils Absolute 0.3  0.0 - 0.7 K/uL   Basophils Relative 0  0 - 1 %   Basophils Absolute 0.1  0.0 - 0.1 K/uL  COMPREHENSIVE METABOLIC PANEL     Status: Abnormal   Collection Time    09/25/13  1:25 PM      Result Value Ref Range   Sodium 143  137 - 147 mEq/L   Potassium 3.8  3.7 - 5.3 mEq/L   Chloride 103  96 - 112 mEq/L   CO2 27  19 - 32 mEq/L   Glucose, Bld 82  70 - 99 mg/dL   BUN 12  6 - 23 mg/dL    Creatinine, Ser 1.35 (*) 0.50 - 1.10 mg/dL   Calcium 12.2 (*) 8.4 - 10.5 mg/dL   Total Protein 8.9 (*) 6.0 - 8.3 g/dL   Albumin 4.9  3.5 - 5.2 g/dL   AST 15  0 - 37 U/L   ALT 11  0 - 35 U/L   Alkaline Phosphatase 126 (*) 39 - 117 U/L   Total Bilirubin 0.4  0.3 - 1.2 mg/dL   GFR calc non Af Amer 44 (*) >90 mL/min   GFR calc Af Amer 51 (*) >90 mL/min   Comment: (NOTE)     The eGFR has been calculated using the CKD EPI equation.     This calculation has not been validated in all clinical situations.     eGFR's persistently <90 mL/min signify possible Chronic Kidney     Disease.  ACETAMINOPHEN LEVEL     Status: None   Collection Time    09/25/13  1:25 PM      Result Value Ref Range   Acetaminophen (Tylenol), Serum <15.0  10 - 30 ug/mL   Comment:            THERAPEUTIC CONCENTRATIONS VARY     SIGNIFICANTLY. A RANGE OF 10-30     ug/mL MAY BE AN EFFECTIVE     CONCENTRATION FOR MANY PATIENTS.     HOWEVER, SOME ARE BEST TREATED     AT CONCENTRATIONS OUTSIDE THIS     RANGE.     ACETAMINOPHEN CONCENTRATIONS     >150 ug/mL AT 4 HOURS AFTER     INGESTION AND >50 ug/mL AT 12     HOURS AFTER INGESTION ARE     OFTEN ASSOCIATED WITH TOXIC     REACTIONS.  SALICYLATE LEVEL     Status: Abnormal   Collection Time    09/25/13  1:25 PM      Result Value Ref Range   Salicylate Lvl <9.7 (*) 2.8 - 20.0 mg/dL  LITHIUM LEVEL     Status: Abnormal   Collection Time    09/25/13  1:25 PM      Result Value Ref Range   Lithium Lvl 2.30 (*) 0.80 - 1.40 mEq/L   Comment: CRITICAL RESULT CALLED TO, READ BACK BY AND VERIFIED WITH:     HAMBY,M. RN AT 1435 09/25/13 BARFIELD,T  I-STAT TROPOININ, ED     Status: None  Collection Time    09/25/13  1:40 PM      Result Value Ref Range   Troponin i, poc 0.00  0.00 - 0.08 ng/mL   Comment 3            Comment: Due to the release kinetics of cTnI,     a negative result within the first hours     of the onset of symptoms does not rule out     myocardial  infarction with certainty.     If myocardial infarction is still suspected,     repeat the test at appropriate intervals.  CULTURE, BLOOD (ROUTINE X 2)     Status: None   Collection Time    09/25/13  3:45 PM      Result Value Ref Range   Specimen Description BLOOD RIGHT HAND     Special Requests BOTTLES DRAWN AEROBIC AND ANAEROBIC 5CC     Culture  Setup Time       Value: 09/25/2013 18:52     Performed at [facility]   Culture       Value:        BLOOD CULTURE RECEIVED NO GROWTH TO DATE CULTURE WILL BE HELD FOR 5 DAYS BEFORE ISSUING A FINAL NEGATIVE REPORT     Performed at [facility]   Report Status PENDING    CULTURE, BLOOD (ROUTINE X 2)     Status: None   Collection Time    09/25/13  3:45 PM      Result Value Ref Range   Specimen Description BLOOD RIGHT HAND     Special Requests BOTTLES DRAWN AEROBIC AND ANAEROBIC 5CC     Culture  Setup Time       Value: 09/25/2013 18:52     Performed at [facility]   Culture       Value:        BLOOD CULTURE RECEIVED NO GROWTH TO DATE CULTURE WILL BE HELD FOR 5 DAYS BEFORE ISSUING A FINAL NEGATIVE REPORT     Performed at [facility]   Report Status PENDING    HIV ANTIBODY (ROUTINE TESTING)     Status: None   Collection Time    09/25/13  6:22 PM      Result Value Ref Range   HIV NON REACTIVE  NON REACTIVE   Comment: (NOTE)     Effective September 29, 2013, [facility] will no longer offer the     current 3rd Generation HIV diagnostic screening assay, HIV Antibodies,     HIV-1/2 EIA, with reflexes. At that time, [facility] will     only offer HIV-1/2 Ag/Ab, 4th Gen, w/ Reflexes as recommended by the     CDC. This HIV diagnostic screening assay tests for antibodies to HIV-1     and HIV-2 as well as HIV p24 antigen and provides greater sensitivity     for the detection of recent infection. Any orders for the 3rd     Generation assay will automatically be referred to the 4th Generation      assay.     Performed at [facility]     Status: Abnormal   Collection Time    09/25/13  6:22 PM      Result Value Ref Range   Magnesium 2.7 (*) 1.5 - 2.5 mg/dL  PHOSPHORUS     Status: None   Collection Time    09/25/13  6:22 PM  Result Value Ref Range   Phosphorus 3.1  2.3 - 4.6 mg/dL  LEGIONELLA ANTIGEN, URINE     Status: None   Collection Time    09/25/13 11:45 PM      Result Value Ref Range   Specimen Description URINE, RANDOM     Special Requests NONE     Legionella Antigen, Urine       Value: Negative for Legionella pneumophilia serogroup 1     Performed at [facility]   Report Status 09/26/2013 FINAL    STREP PNEUMONIAE URINARY ANTIGEN     Status: None   Collection Time    09/25/13 11:45 PM      Result Value Ref Range   Strep Pneumo Urinary Antigen NEGATIVE  NEGATIVE   Comment:            Infection due to S. pneumoniae     cannot be absolutely ruled out     since the antigen present     may be below the detection limit     of the test.     (NOTE)     [place]     Performed at [place]     Status: Abnormal   Collection Time    09/26/13  4:46 AM      Result Value Ref Range   Lithium Lvl 1.54 (*) 0.80 - 1.40 mEq/L   Comment: CRITICAL RESULT CALLED TO, READ BACK BY AND VERIFIED WITH:     R REID AT 0600 ON 04.03.2015 BY NBROOKS  BASIC METABOLIC PANEL     Status: Abnormal   Collection Time    09/26/13  4:46 AM      Result Value Ref Range   Sodium 155 (*) 137 - 147 mEq/L   Comment: RESULT REPEATED AND VERIFIED     DELTA CHECK NOTED   Potassium 4.5  3.7 - 5.3 mEq/L   Chloride 118 (*) 96 - 112 mEq/L   Comment: RESULT REPEATED AND VERIFIED     DELTA CHECK NOTED   CO2 29  19 - 32 mEq/L   Glucose, Bld 112 (*) 70 - 99 mg/dL   BUN 15  6 - 23 mg/dL   Creatinine, Ser 1.41 (*) 0.50 - 1.10 mg/dL   Calcium 11.6 (*) 8.4 - 10.5 mg/dL   GFR calc non Af Amer 42 (*) >90 mL/min   GFR calc Af Amer 49 (*) >90 mL/min    Comment: (NOTE)     The eGFR has been calculated using the CKD EPI equation.     This calculation has not been validated in all clinical situations.     eGFR's persistently <90 mL/min signify possible Chronic Kidney     Disease.  CBC     Status: Abnormal   Collection Time    09/26/13  4:46 AM      Result Value Ref Range   WBC 10.9 (*) 4.0 - 10.5 K/uL   RBC 3.87  3.87 - 5.11 MIL/uL   Hemoglobin 9.9 (*) 12.0 - 15.0 g/dL   HCT 31.1 (*) 36.0 - 46.0 %   MCV 80.4  78.0 - 100.0 fL   MCH 25.6 (*) 26.0 - 34.0 pg   MCHC 31.8  30.0 - 36.0 g/dL   RDW 13.9  11.5 - 15.5 %   Platelets 311  150 - 400 K/uL  SODIUM, URINE, RANDOM     Status: None   Collection Time    09/26/13  2:56 PM  Result Value Ref Range   Sodium, Ur 41     Comment: Performed at [facility]    Dg Chest 2 View  09/25/2013   CLINICAL DATA:  Chest pain.  EXAM: CHEST  2 VIEW  COMPARISON:  None.  FINDINGS: The heart size and mediastinal contours are within normal limits. Mild area of increased density left lung base superior blunting of left costophrenic angle. Bilateral nipple shadows appreciated. Osseous structures unremarkable.  IMPRESSION: Left lower lobe infiltrate and trace effusion.   Electronically Signed   By: Margaree Mackintosh M.D.   On: 09/25/2013 13:19   US Renal  09/26/2013   CLINICAL DATA:  Acute kidney injury.  EXAM: RENAL/URINARY TRACT ULTRASOUND COMPLETE  COMPARISON:  None.  FINDINGS: Right Kidney:  Length: 12.3 cm. The right kidney is diffusely echogenic with poor cortical-medullary differentiation. Mild fullness of the right renal collecting system. Renal pelvis is not well visualized. There are several hypoechoic structures in the right kidney that are suggestive for cysts. The largest measures up to 1.8 cm. Some of the cysts are mildly complex with a septation. Multiple small echogenic foci in the right renal parenchyma.  Left Kidney:  Length: 10.8 cm. Increased echogenicity in the left kidney. In addition,  there are multiple echogenic foci throughout the left renal parenchyma. There is mild fullness of the left renal collecting system consistent with mild hydronephrosis. There is a complex cyst in the left kidney with a septation that measures 2.9 x 1.9 x 2.2 cm. There is a second complex cyst that measures up to 1.9 cm. There does not appear to be a normal renal sinus which is similar to the right kidney.  Bladder:  Fluid in the urinary bladder and no gross abnormality.  Other findings:  There are bilateral pleural effusions.  IMPRESSION: Unusual appearance of both kidneys. Both kidneys are echogenic and have multiple echogenic foci throughout the parenchyma. Neither kidney appears to have a normal renal sinus. The increased echogenicity can be associated with chronic medical renal disease and the punctate echogenic foci could be related to lithium use (based on the patient's history). There is mild fullness in the right renal collecting system and mild left hydronephrosis. The renal collecting systems and kidney morphology may be better characterized with cross-sectional imaging.  Bilateral pleural effusions.  Bilateral renal cysts. Many of these renal cysts are complex. Recommend surveillance of these cysts with ultrasound or the cysts could be more definitively evaluated with MRI.   Electronically Signed   By: Markus Daft M.D.   On: 09/26/2013 16:40    Positive for anxiety Blood pressure 136/84, pulse 94, temperature 97.9 F (36.6 C), temperature source Oral, resp. rate 18, height $RemoveBe'5\' 4"'mdPmeGSBi$  (1.626 m), weight 69.491 kg (153 lb 3.2 oz), SpO2 97.00%.   Assessment/Plan: Schizoaffective disorder  Recommendation: Patient benefit from anticholinergic medication Cogentin 0.5 mg twice daily and also Benadryl 25 mg twice daily for extrapyramidal symptoms, and gross tremors in bilateral extremities Continue risperidone 2 mg in the morning time and 4 mg at bedtime for schizoaffective disorder  Patient has been fairly  doing well without lithium, do not see need for lithium at this time  Appreciate psychiatric consultation and will sign off at this time     [facility] R. 09/26/2013, 6:44 PM

## 2013-09-26 NOTE — Progress Notes (Signed)
ANTIBIOTIC CONSULT NOTE - FOLLOW UP  Pharmacy Consult for levofloxacin Indication: pneumonia  No Known Allergies  Patient Measurements: Height: 5\' 4"  (162.6 cm) Weight: 153 lb 3.2 oz (69.491 kg) IBW/kg (Calculated) : 54.7 Adjusted Body Weight:   Vital Signs: Temp: 97.7 F (36.5 C) (04/03 0641) Temp src: Oral (04/03 0641) BP: 153/92 mmHg (04/03 0641) Pulse Rate: 76 (04/03 0641) Intake/Output from previous day: 04/02 0701 - 04/03 0700 In: 2453.3 [P.O.:720; I.V.:583.3; IV Piggyback:1150] Out: 3400 [Urine:3400] Intake/Output from this shift: Total I/O In: 240 [P.O.:240] Out: 1600 [Urine:1600]  Labs:  Recent Labs  09/25/13 1325 09/26/13 0446  WBC 14.8* 10.9*  HGB 11.5* 9.9*  PLT 355 311  CREATININE 1.35* 1.41*   Estimated Creatinine Clearance: 44.7 ml/min (by C-G formula based on Cr of 1.41). No results found for this basename: VANCOTROUGH, Corlis Leak, VANCORANDOM, [place], GENTPEAK, GENTRANDOM, TOBRATROUGH, TOBRAPEAK, TOBRARND, AMIKACINPEAK, AMIKACINTROU, AMIKACIN,  in the last 72 hours   Microbiology: Recent Results (from the past 720 hour(s))  CULTURE, BLOOD (ROUTINE X 2)     Status: None   Collection Time    09/25/13  3:45 PM      Result Value Ref Range Status   Specimen Description BLOOD RIGHT HAND   Final   Special Requests BOTTLES DRAWN AEROBIC AND ANAEROBIC 5CC   Final   Culture  Setup Time     Final   Value: 09/25/2013 18:52     Performed at [facility]   Culture     Final   Value:        BLOOD CULTURE RECEIVED NO GROWTH TO DATE CULTURE WILL BE HELD FOR 5 DAYS BEFORE ISSUING A FINAL NEGATIVE REPORT     Performed at [facility]   Report Status PENDING   Incomplete  CULTURE, BLOOD (ROUTINE X 2)     Status: None   Collection Time    09/25/13  3:45 PM      Result Value Ref Range Status   Specimen Description BLOOD RIGHT HAND   Final   Special Requests BOTTLES DRAWN AEROBIC AND ANAEROBIC 5CC   Final   Culture  Setup Time     Final   Value: 09/25/2013 18:52     Performed at [facility]   Culture     Final   Value:        BLOOD CULTURE RECEIVED NO GROWTH TO DATE CULTURE WILL BE HELD FOR 5 DAYS BEFORE ISSUING A FINAL NEGATIVE REPORT     Performed at [facility]   Report Status PENDING   Incomplete    Anti-infectives   Start     Dose/Rate Route Frequency Ordered Stop   09/26/13 1600  levofloxacin (LEVAQUIN) IVPB 750 mg     750 mg 100 mL/hr over 90 Minutes Intravenous Every 48 hours 09/26/13 1150     09/26/13 0500  vancomycin (VANCOCIN) 500 mg in sodium chloride 0.9 % 100 mL IVPB  Status:  Discontinued     500 mg 100 mL/hr over 60 Minutes Intravenous Every 12 hours 09/25/13 1600 09/25/13 1739   09/26/13 0400  ceFEPIme (MAXIPIME) 1 g in dextrose 5 % 50 mL IVPB  Status:  Discontinued     1 g 100 mL/hr over 30 Minutes Intravenous Every 12 hours 09/25/13 1600 09/25/13 1739   09/25/13 1900  vancomycin (VANCOCIN) 500 mg in sodium chloride 0.9 % 100 mL IVPB  Status:  Discontinued     500 mg 100 mL/hr over 60 Minutes Intravenous Every  12 hours 09/25/13 1739 09/26/13 1132   09/25/13 1800  ceFEPIme (MAXIPIME) 1 g in dextrose 5 % 50 mL IVPB  Status:  Discontinued     1 g 100 mL/hr over 30 Minutes Intravenous Every 12 hours 09/25/13 1739 09/26/13 1132   09/25/13 1745  ceFEPIme (MAXIPIME) 1 g in dextrose 5 % 50 mL IVPB  Status:  Discontinued     1 g 100 mL/hr over 30 Minutes Intravenous 3 times per day 09/25/13 1735 09/25/13 1737   09/25/13 1500  ceFEPIme (MAXIPIME) 1 g in dextrose 5 % 50 mL IVPB  Status:  Discontinued     1 g 100 mL/hr over 30 Minutes Intravenous  Once 09/25/13 1455 09/25/13 1739   09/25/13 1500  vancomycin (VANCOCIN) IVPB 1000 mg/200 mL premix  Status:  Discontinued     1,000 mg 200 mL/hr over 60 Minutes Intravenous STAT 09/25/13 1457 09/25/13 1739      Assessment: 53 yo presented to ER for medical clearance with hx schizoaffective disorder. She was sent from [facility] to have lithium  levels assessed and were found to be supratherapeutic at 2.3. Patient also SOB and was found to have pneumonia. To start vancomycin and cefepime for treatment of HCAP. Baseline labs: afebrile, WBC 14.8, SCr 1.35 with est CrCl 47 ml/min. Note that first doses of antibiotics have not been started yet because of trouble starting IV line - IV team has been paged. No risk factors to qualify for HCAP, discuss with [facility] and orders to narrow to levofloxacin 4/3  4/2 >> vancomycin >> 4/3 4/2 >> cefepime >> 4/3 4/3 >> levofloxacin >>  Tmax: afebrile WBCs: 10.9 (improved) Renal: SCr 1.41, CrCl 64ml/min (C-G), and 42ml/min (N), SCr trended up - good UOP  4/2 blood:pending 4/2 blood:pending / sputum: ordered 4/2: s. pneumo Ag: neg 4/2: Legionella: pending  Goal of Therapy:  Dose appropriately for renal function  Plan:   Levofloxacin 750mg  IV q48h  Follow Scr closely  Doreene Eland, PharmD, BCPS.   Pager: [phone]  09/26/2013,11:51 AM

## 2013-09-27 LAB — OSMOLALITY: OSMOLALITY: 320 mosm/kg — AB (ref 275–300)

## 2013-09-27 LAB — BASIC METABOLIC PANEL
BUN: 14 mg/dL (ref 6–23)
CHLORIDE: 117 meq/L — AB (ref 96–112)
CO2: 26 mEq/L (ref 19–32)
Calcium: 11.4 mg/dL — ABNORMAL HIGH (ref 8.4–10.5)
Creatinine, Ser: 1.56 mg/dL — ABNORMAL HIGH (ref 0.50–1.10)
GFR calc Af Amer: 43 mL/min — ABNORMAL LOW (ref >90)
GFR, EST NON AFRICAN AMERICAN: 37 mL/min — AB (ref >90)
Glucose, Bld: 108 mg/dL — ABNORMAL HIGH (ref 70–99)
POTASSIUM: 4.1 meq/L (ref 3.7–5.3)
Sodium: 153 mEq/L — ABNORMAL HIGH (ref 137–147)

## 2013-09-27 LAB — OSMOLALITY, URINE: OSMOLALITY UR: 135 mosm/kg — AB (ref 390–1090)

## 2013-09-27 MED ORDER — DEXTROSE 5 % IV SOLN
INTRAVENOUS | Status: DC
  Administered 2013-09-27 (×2): via INTRAVENOUS

## 2013-09-27 NOTE — ED Provider Notes (Signed)
Medical screening examination/treatment/procedure(s) were performed by non-physician practitioner and as supervising physician I was immediately available for consultation/collaboration.   EKG Interpretation   Date/Time:  Thursday September 25 2013 13:32:32 EDT Ventricular Rate:  94 PR Interval:  178 QRS Duration: 80 QT Interval:  319 QTC Calculation: 399 R Axis:   56 Text Interpretation:  Sinus rhythm Probable left atrial enlargement ST  elev, probable normal early repol pattern ED PHYSICIAN INTERPRETATION  AVAILABLE IN [facility] Confirmed by TEST, Record ([id number]) on 09/27/2013  1:13:01 PM       Jasper Riling. Alvino Chapel, MD 09/27/13 [phone]

## 2013-09-27 NOTE — Progress Notes (Signed)
[facility] PROGRESS NOTE  Tara Hurst WIO:[id number] DOB: Mar 05, 1961 DOA: 09/25/2013 PCP: Elyn Peers, MD  Assessment/Plan:  53 y.o. female with history of schizoaffective disorder and hypertension is admitted with PNA, ? DI  1. Pneumonia/sirs/sepsis/leukocytosis; CXR: Left lower lobe infiltrate and trace effusion -improving, afebrile; cotn IV atx, IVF, oxygen, bronchodilators prn   2. Lithium toxicity suspicious DI; has polyuria, polydipsia; ? Nephrogenic DI with lithium -low urin osm, high serum osm; strict I/O, urine output,  renal US: changes related to lithium toxicity  -d/w nephrologist Dr. Lorrene Reid, who recommended to increase free water; may use hctz if not responsive   3. AKI on ? CKD with lithium toxicity; cont IVF; f/u labs; hold indapamide;   4. Schizoaffective disorder on lithium, respirdal; consulted psychiatry; we stopped lithium due to DI;  -appreciate psychiatry evaluation;  started cogentin, benadryl    Code Status: full Family Communication: d/w patient, called updated [place],Patricia Mother [phone]  (indicate person spoken with, relationship, and if by phone, the number) Disposition Plan: pend clinical improvement    Consultants:  None   Procedures:  None   Antibiotics:  levofloxacin 4/3<<<<   (indicate start date, and stop date if known)  HPI/Subjective: alert  Objective: Filed Vitals:   09/27/13 0552  BP: 121/96  Pulse: 65  Temp: 99.1 F (37.3 C)  Resp: 20    Intake/Output Summary (Last 24 hours) at 09/27/13 1008 Last data filed at 09/27/13 0315  Gross per 24 hour  Intake    960 ml  Output   3900 ml  Net  -2940 ml   Filed Weights   09/25/13 1524  Weight: 69.491 kg (153 lb 3.2 oz)    Exam:   General:  alert  Cardiovascular: s1,s2 rrr  Respiratory: CTA BL  Abdomen: soft, nt,nd   Musculoskeletal: no LE edema   Data Reviewed: Basic Metabolic Panel:  Recent Labs Lab 09/25/13 1325 09/25/13 1822  09/26/13 0446 09/27/13 0445  NA 143  --  155* 153*  K 3.8  --  4.5 4.1  CL 103  --  118* 117*  CO2 27  --  29 26  GLUCOSE 82  --  112* 108*  BUN 12  --  15 14  CREATININE 1.35*  --  1.41* 1.56*  CALCIUM 12.2*  --  11.6* 11.4*  MG  --  2.7*  --   --   PHOS  --  3.1  --   --    Liver Function Tests:  Recent Labs Lab 09/25/13 1325  AST 15  ALT 11  ALKPHOS 126*  BILITOT 0.4  PROT 8.9*  ALBUMIN 4.9   No results found for this basename: LIPASE, AMYLASE,  in the last 168 hours No results found for this basename: AMMONIA,  in the last 168 hours CBC:  Recent Labs Lab 09/25/13 1325 09/26/13 0446  WBC 14.8* 10.9*  NEUTROABS 11.1*  --   HGB 11.5* 9.9*  HCT 36.0 31.1*  MCV 80.9 80.4  PLT 355 311   Cardiac Enzymes: No results found for this basename: CKTOTAL, CKMB, CKMBINDEX, TROPONINI,  in the last 168 hours BNP (last 3 results) No results found for this basename: PROBNP,  in the last 8760 hours CBG: No results found for this basename: GLUCAP,  in the last 168 hours  Recent Results (from the past 240 hour(s))  CULTURE, BLOOD (ROUTINE X 2)     Status: None   Collection Time    09/25/13  3:45 PM  Result Value Ref Range Status   Specimen Description BLOOD RIGHT HAND   Final   Special Requests BOTTLES DRAWN AEROBIC AND ANAEROBIC 5CC   Final   Culture  Setup Time     Final   Value: 09/25/2013 18:52     Performed at [facility]   Culture     Final   Value:        BLOOD CULTURE RECEIVED NO GROWTH TO DATE CULTURE WILL BE HELD FOR 5 DAYS BEFORE ISSUING A FINAL NEGATIVE REPORT     Performed at [facility]   Report Status PENDING   Incomplete  CULTURE, BLOOD (ROUTINE X 2)     Status: None   Collection Time    09/25/13  3:45 PM      Result Value Ref Range Status   Specimen Description BLOOD RIGHT HAND   Final   Special Requests BOTTLES DRAWN AEROBIC AND ANAEROBIC 5CC   Final   Culture  Setup Time     Final   Value: 09/25/2013 18:52     Performed at  [facility]   Culture     Final   Value:        BLOOD CULTURE RECEIVED NO GROWTH TO DATE CULTURE WILL BE HELD FOR 5 DAYS BEFORE ISSUING A FINAL NEGATIVE REPORT     Performed at [facility]   Report Status PENDING   Incomplete     Studies: Dg Chest 2 View  09/25/2013   CLINICAL DATA:  Chest pain.  EXAM: CHEST  2 VIEW  COMPARISON:  None.  FINDINGS: The heart size and mediastinal contours are within normal limits. Mild area of increased density left lung base superior blunting of left costophrenic angle. Bilateral nipple shadows appreciated. Osseous structures unremarkable.  IMPRESSION: Left lower lobe infiltrate and trace effusion.   Electronically Signed   By: Margaree Mackintosh M.D.   On: 09/25/2013 13:19   US Renal  09/26/2013   CLINICAL DATA:  Acute kidney injury.  EXAM: RENAL/URINARY TRACT ULTRASOUND COMPLETE  COMPARISON:  None.  FINDINGS: Right Kidney:  Length: 12.3 cm. The right kidney is diffusely echogenic with poor cortical-medullary differentiation. Mild fullness of the right renal collecting system. Renal pelvis is not well visualized. There are several hypoechoic structures in the right kidney that are suggestive for cysts. The largest measures up to 1.8 cm. Some of the cysts are mildly complex with a septation. Multiple small echogenic foci in the right renal parenchyma.  Left Kidney:  Length: 10.8 cm. Increased echogenicity in the left kidney. In addition, there are multiple echogenic foci throughout the left renal parenchyma. There is mild fullness of the left renal collecting system consistent with mild hydronephrosis. There is a complex cyst in the left kidney with a septation that measures 2.9 x 1.9 x 2.2 cm. There is a second complex cyst that measures up to 1.9 cm. There does not appear to be a normal renal sinus which is similar to the right kidney.  Bladder:  Fluid in the urinary bladder and no gross abnormality.  Other findings:  There are bilateral pleural effusions.   IMPRESSION: Unusual appearance of both kidneys. Both kidneys are echogenic and have multiple echogenic foci throughout the parenchyma. Neither kidney appears to have a normal renal sinus. The increased echogenicity can be associated with chronic medical renal disease and the punctate echogenic foci could be related to lithium use (based on the patient's history). There is mild fullness in the right renal  collecting system and mild left hydronephrosis. The renal collecting systems and kidney morphology may be better characterized with cross-sectional imaging.  Bilateral pleural effusions.  Bilateral renal cysts. Many of these renal cysts are complex. Recommend surveillance of these cysts with ultrasound or the cysts could be more definitively evaluated with MRI.   Electronically Signed   By: Markus Daft M.D.   On: 09/26/2013 16:40    Scheduled Meds: . benztropine  0.5 mg Oral BID  . heparin  5,000 Units Subcutaneous 3 times per day  . hydrOXYzine  50 mg Oral QHS  . levofloxacin (LEVAQUIN) IV  750 mg Intravenous Q48H  . risperiDONE  2 mg Oral Daily  . risperiDONE  4 mg Oral QHS   Continuous Infusions: . sodium chloride 150 mL/hr at 09/27/13 8264    Principal Problem:   HCAP (healthcare-associated pneumonia) Active Problems:   Schizoaffective disorder   Hypertension   Abnormal lithium level in blood    Time spent: >35 minutes     Kinnie Feil  [facility] Pager [phone]. If 7PM-7AM, please contact night-coverage at [email or web address], password [facility] 09/27/2013, 10:08 AM  LOS: 2 days

## 2013-09-27 NOTE — Progress Notes (Signed)
Chart reviewed.  Noted that psych MD made medication recommendations; inpt psych tx not warranted, per note.  CSW met with Pt to discuss outpt tx needs.  Pt stated that she's followed by [facility] and that she gets herself there by [facility] or taxi.  Additionally, she has members of her previous church, including her minister, visit with her frequently and help with her needs.  Upon d/c, Pt intends to f/u with [facility] and continue her outpt tx with them.  Pt stated that she has a friend, April, who will pick her up at d/c.  CSW thanked Pt for her time.  No further CSW needs.  CSW to sign off.  Bernita Raisin, [employer] [phone].

## 2013-09-28 LAB — BASIC METABOLIC PANEL
BUN: 12 mg/dL (ref 6–23)
CALCIUM: 10.7 mg/dL — AB (ref 8.4–10.5)
CHLORIDE: 113 meq/L — AB (ref 96–112)
CO2: 28 meq/L (ref 19–32)
CREATININE: 1.36 mg/dL — AB (ref 0.50–1.10)
GFR calc non Af Amer: 44 mL/min — ABNORMAL LOW (ref >90)
GFR, EST AFRICAN AMERICAN: 51 mL/min — AB (ref >90)
Glucose, Bld: 139 mg/dL — ABNORMAL HIGH (ref 70–99)
Potassium: 4.1 mEq/L (ref 3.7–5.3)
SODIUM: 147 meq/L (ref 137–147)

## 2013-09-28 MED ORDER — BENZTROPINE MESYLATE 0.5 MG PO TABS: 0.5000 mg | ORAL_TABLET | Freq: Two times a day (BID) | ORAL | Status: DC

## 2013-09-28 MED ORDER — DIPHENHYDRAMINE HCL 25 MG PO CAPS: 25.0000 mg | ORAL_CAPSULE | Freq: Two times a day (BID) | ORAL | Status: DC | PRN

## 2013-09-28 MED ORDER — LEVOFLOXACIN 750 MG PO TABS
750.0000 mg | ORAL_TABLET | ORAL | Status: DC
  Filled 2013-09-28: qty 1

## 2013-09-28 MED ORDER — LEVOFLOXACIN 750 MG PO TABS: 750.0000 mg | ORAL_TABLET | ORAL | Status: DC

## 2013-09-28 MED ORDER — RISPERIDONE 2 MG PO TABS: 2.0000 mg | ORAL_TABLET | Freq: Two times a day (BID) | ORAL | Status: DC

## 2013-09-28 NOTE — Progress Notes (Signed)
Pt is having periods during night where she is waking up and has urinated in the bed.  She states she only does this here.  She is drinking a great deal of fluid and has at least 1800 IV intake a shift. No crackles heard in lung fields but at times pt does have a wet cough,  No shortness of breath, no edema.noted.

## 2013-09-28 NOTE — Discharge Summary (Signed)
Physician Discharge Summary  Tara Hurst VHQ:[id number] DOB: [birth year] DOA: 09/25/2013  PCP: Elyn Peers, MD  Admit date: 09/25/2013 Discharge date: 09/28/2013  Time spent: >35 minutes  Recommendations for Outpatient Follow-up:  F/u with PCP in 3 days Discharge Diagnoses:  Principal Problem:   HCAP (healthcare-associated pneumonia) Active Problems:   Schizoaffective disorder   Hypertension   Abnormal lithium level in blood   Discharge Condition: stable   Diet recommendation: heart healthy   Filed Weights   09/25/13 1524  Weight: 69.491 kg (153 lb 3.2 oz)    History of present illness:  53 y.o. female with history of schizoaffective disorder and hypertension is admitted with PNA, ? DI   Hospital Course:  1. Pneumonia/sirs/sepsis/leukocytosis; CXR: Left lower lobe infiltrate and trace effusion  -improving, afebrile; changed to PO atx   2. Lithium toxicity suspicious DI; has polyuria, polydipsia; ? Nephrogenic DI with lithium  -renal US: changes related to lithium toxicity  -d/w nephrologist Dr. Lorrene Reid, who recommended to increase free water PO; renal function is improving, Na is improving; recommended outpatient follow up with BMP in 3 days, outpatient renal MRI to follow up   3. AKI on ? CKD with lithium toxicity; improving  4. Schizoaffective disorder on lithium, respirdal; consulted psychiatry; we stopped lithium due to DI;  -appreciate psychiatry evaluation; started cogentin, benadryl   Patient wanted to go home, she lives with her mother and provides care for her'; recommended to f/u with PCP in 3 days   Procedures:  None  (i.e. Studies not automatically included, echos, thoracentesis, etc; not x-rays)  Consultations:  Nephrology, over the phone   Discharge Exam: Filed Vitals:   09/28/13 0515  BP: 119/73  Pulse: 84  Temp: 97.4 F (36.3 C)  Resp: 18    General: alert Cardiovascular: s1,s2 rrr Respiratory: CTA BL  Discharge Instructions  Discharge  Orders   Future Orders Complete By Expires   Diet - low sodium heart healthy  As directed    Discharge instructions  As directed    Comments:     Please follow up with primary care doctor in 3 days to check blood test   Increase activity slowly  As directed        Medication List    STOP taking these medications       indapamide 1.25 MG tablet  Commonly known as:  LOZOL     lithium 300 MG tablet      TAKE these medications       benztropine 0.5 MG tablet  Commonly known as:  COGENTIN  Take 1 tablet (0.5 mg total) by mouth 2 (two) times daily.     diphenhydrAMINE 25 mg capsule  Commonly known as:  BENADRYL  Take 1 capsule (25 mg total) by mouth 2 (two) times daily as needed for sleep (Extrapyramidal symptoms).     hydrOXYzine 50 MG capsule  Commonly known as:  VISTARIL  Take 50 mg by mouth at bedtime.     levofloxacin 750 MG tablet  Commonly known as:  LEVAQUIN  Take 1 tablet (750 mg total) by mouth every other day.     risperiDONE 2 MG tablet  Commonly known as:  RISPERDAL  Take 1-2 tablets (2-4 mg total) by mouth 2 (two) times daily. 2mg  every morning and 4mg  nightly       No Known Allergies     Follow-up Information   Follow up with Elyn Peers, MD. Schedule an appointment as soon as possible for a  visit in 3 days. (need follow up BMP)    Specialty:  Family Medicine   Contact information:   [place] [address] [phone]        The results of significant diagnostics from this hospitalization (including imaging, microbiology, ancillary and laboratory) are listed below for reference.    Significant Diagnostic Studies: Dg Chest 2 View  09/25/2013   CLINICAL DATA:  Chest pain.  EXAM: CHEST  2 VIEW  COMPARISON:  None.  FINDINGS: The heart size and mediastinal contours are within normal limits. Mild area of increased density left lung base superior blunting of left costophrenic angle. Bilateral nipple shadows appreciated. Osseous  structures unremarkable.  IMPRESSION: Left lower lobe infiltrate and trace effusion.   Electronically Signed   By: Margaree Mackintosh M.D.   On: 09/25/2013 13:19   US Renal  09/26/2013   CLINICAL DATA:  Acute kidney injury.  EXAM: RENAL/URINARY TRACT ULTRASOUND COMPLETE  COMPARISON:  None.  FINDINGS: Right Kidney:  Length: 12.3 cm. The right kidney is diffusely echogenic with poor cortical-medullary differentiation. Mild fullness of the right renal collecting system. Renal pelvis is not well visualized. There are several hypoechoic structures in the right kidney that are suggestive for cysts. The largest measures up to 1.8 cm. Some of the cysts are mildly complex with a septation. Multiple small echogenic foci in the right renal parenchyma.  Left Kidney:  Length: 10.8 cm. Increased echogenicity in the left kidney. In addition, there are multiple echogenic foci throughout the left renal parenchyma. There is mild fullness of the left renal collecting system consistent with mild hydronephrosis. There is a complex cyst in the left kidney with a septation that measures 2.9 x 1.9 x 2.2 cm. There is a second complex cyst that measures up to 1.9 cm. There does not appear to be a normal renal sinus which is similar to the right kidney.  Bladder:  Fluid in the urinary bladder and no gross abnormality.  Other findings:  There are bilateral pleural effusions.  IMPRESSION: Unusual appearance of both kidneys. Both kidneys are echogenic and have multiple echogenic foci throughout the parenchyma. Neither kidney appears to have a normal renal sinus. The increased echogenicity can be associated with chronic medical renal disease and the punctate echogenic foci could be related to lithium use (based on the patient's history). There is mild fullness in the right renal collecting system and mild left hydronephrosis. The renal collecting systems and kidney morphology may be better characterized with cross-sectional imaging.  Bilateral  pleural effusions.  Bilateral renal cysts. Many of these renal cysts are complex. Recommend surveillance of these cysts with ultrasound or the cysts could be more definitively evaluated with MRI.   Electronically Signed   By: Markus Daft M.D.   On: 09/26/2013 16:40    Microbiology: Recent Results (from the past 240 hour(s))  CULTURE, BLOOD (ROUTINE X 2)     Status: None   Collection Time    09/25/13  3:45 PM      Result Value Ref Range Status   Specimen Description BLOOD RIGHT HAND   Final   Special Requests BOTTLES DRAWN AEROBIC AND ANAEROBIC 5CC   Final   Culture  Setup Time     Final   Value: 09/25/2013 18:52     Performed at [facility]   Culture     Final   Value:        BLOOD CULTURE RECEIVED NO GROWTH TO DATE CULTURE  WILL BE HELD FOR 5 DAYS BEFORE ISSUING A FINAL NEGATIVE REPORT     Performed at [facility]   Report Status PENDING   Incomplete  CULTURE, BLOOD (ROUTINE X 2)     Status: None   Collection Time    09/25/13  3:45 PM      Result Value Ref Range Status   Specimen Description BLOOD RIGHT HAND   Final   Special Requests BOTTLES DRAWN AEROBIC AND ANAEROBIC 5CC   Final   Culture  Setup Time     Final   Value: 09/25/2013 18:52     Performed at [facility]   Culture     Final   Value:        BLOOD CULTURE RECEIVED NO GROWTH TO DATE CULTURE WILL BE HELD FOR 5 DAYS BEFORE ISSUING A FINAL NEGATIVE REPORT     Performed at [facility]   Report Status PENDING   Incomplete     Labs: Basic Metabolic Panel:  Recent Labs Lab 09/25/13 1325 09/25/13 1822 09/26/13 0446 09/27/13 0445 09/28/13 0413  NA 143  --  155* 153* 147  K 3.8  --  4.5 4.1 4.1  CL 103  --  118* 117* 113*  CO2 27  --  29 26 28   GLUCOSE 82  --  112* 108* 139*  BUN 12  --  15 14 12   CREATININE 1.35*  --  1.41* 1.56* 1.36*  CALCIUM 12.2*  --  11.6* 11.4* 10.7*  MG  --  2.7*  --   --   --   PHOS  --  3.1  --   --   --    Liver Function Tests:  Recent Labs Lab  09/25/13 1325  AST 15  ALT 11  ALKPHOS 126*  BILITOT 0.4  PROT 8.9*  ALBUMIN 4.9   No results found for this basename: LIPASE, AMYLASE,  in the last 168 hours No results found for this basename: AMMONIA,  in the last 168 hours CBC:  Recent Labs Lab 09/25/13 1325 09/26/13 0446  WBC 14.8* 10.9*  NEUTROABS 11.1*  --   HGB 11.5* 9.9*  HCT 36.0 31.1*  MCV 80.9 80.4  PLT 355 311   Cardiac Enzymes: No results found for this basename: CKTOTAL, CKMB, CKMBINDEX, TROPONINI,  in the last 168 hours BNP: BNP (last 3 results) No results found for this basename: PROBNP,  in the last 8760 hours CBG: No results found for this basename: GLUCAP,  in the last 168 hours     Signed:  Rowe Clack N  [facility] 09/28/2013, 11:18 AM

## 2013-09-28 NOTE — Progress Notes (Signed)
ANTIBIOTIC CONSULT NOTE - FOLLOW UP  Pharmacy Consult for levofloxacin Indication: pneumonia  No Known Allergies  Patient Measurements: Height: 5\' 4"  (162.6 cm) Weight: 153 lb 3.2 oz (69.491 kg) IBW/kg (Calculated) : 54.7 Adjusted Body Weight:   Vital Signs: Temp: 97.4 F (36.3 C) (04/05 0515) Temp src: Oral (04/05 0515) BP: 119/73 mmHg (04/05 0515) Pulse Rate: 84 (04/05 0515) Intake/Output from previous day: 04/04 0701 - 04/05 0700 In: 4512.5 [P.O.:1770; I.V.:2742.5] Out: 7500 [Urine:7500] Intake/Output from this shift: Total I/O In: 240 [P.O.:240] Out: 1150 [Urine:1150]  Labs:  Recent Labs  09/25/13 1325 09/26/13 0446 09/27/13 0445 09/28/13 0413  WBC 14.8* 10.9*  --   --   HGB 11.5* 9.9*  --   --   PLT 355 311  --   --   CREATININE 1.35* 1.41* 1.56* 1.36*   Estimated Creatinine Clearance: 46.3 ml/min (by C-G formula based on Cr of 1.36). No results found for this basename: VANCOTROUGH, Corlis Leak, VANCORANDOM, Blair, GENTPEAK, GENTRANDOM, TOBRATROUGH, TOBRAPEAK, TOBRARND, AMIKACINPEAK, AMIKACINTROU, AMIKACIN,  in the last 72 hours   Microbiology: Recent Results (from the past 720 hour(s))  CULTURE, BLOOD (ROUTINE X 2)     Status: None   Collection Time    09/25/13  3:45 PM      Result Value Ref Range Status   Specimen Description BLOOD RIGHT HAND   Final   Special Requests BOTTLES DRAWN AEROBIC AND ANAEROBIC 5CC   Final   Culture  Setup Time     Final   Value: 09/25/2013 18:52     Performed at [facility]   Culture     Final   Value:        BLOOD CULTURE RECEIVED NO GROWTH TO DATE CULTURE WILL BE HELD FOR 5 DAYS BEFORE ISSUING A FINAL NEGATIVE REPORT     Performed at [facility]   Report Status PENDING   Incomplete  CULTURE, BLOOD (ROUTINE X 2)     Status: None   Collection Time    09/25/13  3:45 PM      Result Value Ref Range Status   Specimen Description BLOOD RIGHT HAND   Final   Special Requests BOTTLES DRAWN AEROBIC AND  ANAEROBIC 5CC   Final   Culture  Setup Time     Final   Value: 09/25/2013 18:52     Performed at [facility]   Culture     Final   Value:        BLOOD CULTURE RECEIVED NO GROWTH TO DATE CULTURE WILL BE HELD FOR 5 DAYS BEFORE ISSUING A FINAL NEGATIVE REPORT     Performed at [facility]   Report Status PENDING   Incomplete    Anti-infectives   Start     Dose/Rate Route Frequency Ordered Stop   09/26/13 1600  levofloxacin (LEVAQUIN) IVPB 750 mg     750 mg 100 mL/hr over 90 Minutes Intravenous Every 48 hours 09/26/13 1150     09/26/13 0500  vancomycin (VANCOCIN) 500 mg in sodium chloride 0.9 % 100 mL IVPB  Status:  Discontinued     500 mg 100 mL/hr over 60 Minutes Intravenous Every 12 hours 09/25/13 1600 09/25/13 1739   09/26/13 0400  ceFEPIme (MAXIPIME) 1 g in dextrose 5 % 50 mL IVPB  Status:  Discontinued     1 g 100 mL/hr over 30 Minutes Intravenous Every 12 hours 09/25/13 1600 09/25/13 1739   09/25/13 1900  vancomycin (VANCOCIN) 500 mg in sodium  chloride 0.9 % 100 mL IVPB  Status:  Discontinued     500 mg 100 mL/hr over 60 Minutes Intravenous Every 12 hours 09/25/13 1739 09/26/13 1132   09/25/13 1800  ceFEPIme (MAXIPIME) 1 g in dextrose 5 % 50 mL IVPB  Status:  Discontinued     1 g 100 mL/hr over 30 Minutes Intravenous Every 12 hours 09/25/13 1739 09/26/13 1132   09/25/13 1745  ceFEPIme (MAXIPIME) 1 g in dextrose 5 % 50 mL IVPB  Status:  Discontinued     1 g 100 mL/hr over 30 Minutes Intravenous 3 times per day 09/25/13 1735 09/25/13 1737   09/25/13 1500  ceFEPIme (MAXIPIME) 1 g in dextrose 5 % 50 mL IVPB  Status:  Discontinued     1 g 100 mL/hr over 30 Minutes Intravenous  Once 09/25/13 1455 09/25/13 1739   09/25/13 1500  vancomycin (VANCOCIN) IVPB 1000 mg/200 mL premix  Status:  Discontinued     1,000 mg 200 mL/hr over 60 Minutes Intravenous STAT 09/25/13 1457 09/25/13 1739      Assessment: 53 yo presented to ER for medical clearance with hx schizoaffective  disorder. She was sent from [facility] to have lithium levels assessed and were found to be supratherapeutic at 2.3. Patient also SOB and was found to have pneumonia. To start vancomycin and cefepime for treatment of HCAP. Baseline labs: afebrile, WBC 14.8, SCr 1.35 with est CrCl 47 ml/min. Note that first doses of antibiotics have not been started yet because of trouble starting IV line - IV team has been paged. No risk factors to qualify for HCAP, discuss with TRH and orders to narrow to levofloxacin 4/3  4/2 >> vancomycin >> 4/3 4/2 >> cefepime >> 4/3 4/3 >> levofloxacin >>  Tmax: afebrile WBCs: improved as of 4/3 Renal: SCr better today. 12ml/min (C-G), and 59ml/min (N).  4/2 blood: ngtd 4/2 blood: ngtd / sputum: ordered 4/2: s. pneumo Ag: neg 4/2: Legionella: neg  Goal of Therapy:  Dose appropriately for renal function  Plan:   Cont Levofloxacin 750mg  q48h. Duration?  Meets criteria for auto-switch to PO route.  Follow SCr closely.  Romeo Rabon, PharmD, pager [phone]. 09/28/2013,11:05 AM.

## 2013-10-01 LAB — CULTURE, BLOOD (ROUTINE X 2)
Culture: NO GROWTH
Culture: NO GROWTH

## 2014-09-01 ENCOUNTER — Encounter (HOSPITAL_COMMUNITY): Payer: Self-pay | Admitting: Emergency Medicine

## 2014-09-01 ENCOUNTER — Emergency Department (INDEPENDENT_AMBULATORY_CARE_PROVIDER_SITE_OTHER)
Admission: EM | Admit: 2014-09-01 | Discharge: 2014-09-01 | Disposition: A | Discharge status: Home / Self Care | Payer: Medicaid Other | Source: Home / Self Care | Attending: Family Medicine | Admitting: Family Medicine

## 2014-09-01 DIAGNOSIS — H6122 Impacted cerumen, left ear: Secondary | ICD-10-CM

## 2014-09-01 NOTE — ED Provider Notes (Signed)
CSN: [id number]     Arrival date & time 09/01/14  0808 History   First MD Initiated Contact with Patient 09/01/14 [phone]     Chief Complaint  Patient presents with  . Cerumen Impaction   (Consider location/radiation/quality/duration/timing/severity/associated sxs/prior Treatment) HPI Comments: Reports that her left ear canal is clogged with wax and this is creating diminished hearing  The history is provided by the patient.    Past Medical History  Diagnosis Date  . Hypertension   . Schizoaffective disorder    History reviewed. No pertinent past surgical history. Family History  Problem Relation Age of Onset  . Heart disease Mother   . Diabetic kidney disease Mother    History  Substance Use Topics  . Smoking status: Never Smoker   . Smokeless tobacco: Never Used  . Alcohol Use: No   OB History    No data available     Review of Systems  All other systems reviewed and are negative.   Allergies  Review of patient's allergies indicates no known allergies.  Home Medications   Prior to Admission medications   Medication Sig Start Date End Date Taking? Authorizing Provider  benztropine (COGENTIN) 0.5 MG tablet Take 1 tablet (0.5 mg total) by mouth 2 (two) times daily. 09/28/13  Yes Kinnie Feil, MD  hydrOXYzine (VISTARIL) 50 MG capsule Take 50 mg by mouth at bedtime.   Yes Historical Provider, MD  LORAZEPAM PO Take by mouth.   Yes Historical Provider, MD  risperiDONE (RISPERDAL) 2 MG tablet Take 1-2 tablets (2-4 mg total) by mouth 2 (two) times daily. 2mg  every morning and 4mg  nightly 09/28/13  Yes Kinnie Feil, MD  diphenhydrAMINE (BENADRYL) 25 mg capsule Take 1 capsule (25 mg total) by mouth 2 (two) times daily as needed for sleep (Extrapyramidal symptoms). 09/28/13   Kinnie Feil, MD  levofloxacin (LEVAQUIN) 750 MG tablet Take 1 tablet (750 mg total) by mouth every other day. 09/28/13   Kinnie Feil, MD   BP 125/77 mmHg  Pulse 96  Temp(Src) 98 F (36.7 C)  (Oral)  Resp 16  SpO2 99% Physical Exam  Constitutional: She is oriented to person, place, and time. She appears well-developed and well-nourished. No distress.  HENT:  Head: Normocephalic and atraumatic.  Right Ear: Hearing, external ear and ear canal normal. A middle ear effusion is present.  Left Ear: External ear normal. Decreased hearing is noted.  Nose: Nose normal.  Left cerumen impaction  Cardiovascular: Normal rate.   Pulmonary/Chest: Effort normal.  Musculoskeletal: Normal range of motion.  Neurological: She is alert and oriented to person, place, and time.  Skin: Skin is warm and dry.  Psychiatric: She has a normal mood and affect. Her behavior is normal.  Nursing note and vitals reviewed.   ED Course  Procedures (including critical care time) Labs Review Labs Reviewed - No data to display  Imaging Review No results found.   MDM   1. Cerumen impaction, left    Cerumen removal from left ear canal via irrigation. Patient reports improvement.  Follow up prn.   Lutricia Feil, PA 09/01/14 [phone]

## 2014-09-01 NOTE — ED Notes (Addendum)
C/o  Ear wax build up over the past 2 months.   Denies pain.  No otc treatment tried.

## 2014-09-01 NOTE — Discharge Instructions (Signed)
Cerumen Impaction °A cerumen impaction is when the wax in your ear forms a plug. This plug usually causes reduced hearing. Sometimes it also causes an earache or dizziness. Removing a cerumen impaction can be difficult and painful. The wax sticks to the ear canal. The canal is sensitive and bleeds easily. If you try to remove a heavy wax buildup with a cotton tipped swab, you may push it in further. °Irrigation with water, suction, and small ear curettes may be used to clear out the wax. If the impaction is fixed to the skin in the ear canal, ear drops may be needed for a few days to loosen the wax. People who build up a lot of wax frequently can use ear wax removal products available in your local drugstore. °SEEK MEDICAL CARE IF:  °You develop an earache, increased hearing loss, or marked dizziness. °Document Released: 07/20/2004 Document Revised: 09/04/2011 Document Reviewed: 09/09/2009 °ExitCare® Patient Information ©2015 [employer]. This information is not intended to replace advice given to you by your health care provider. Make sure you discuss any questions you have with your health care provider. ° °

## 2017-05-02 ENCOUNTER — Emergency Department (HOSPITAL_COMMUNITY): Payer: Medicaid Other

## 2017-05-02 ENCOUNTER — Encounter (HOSPITAL_COMMUNITY): Payer: Self-pay | Admitting: Emergency Medicine

## 2017-05-02 ENCOUNTER — Inpatient Hospital Stay (HOSPITAL_COMMUNITY)
Admission: EM | Admit: 2017-05-02 | Discharge: 2017-05-10 | DRG: 896 | Disposition: A | Discharge status: Home / Self Care | Payer: Medicaid Other | Attending: Internal Medicine | Admitting: Internal Medicine

## 2017-05-02 DIAGNOSIS — E232 Diabetes insipidus: Secondary | ICD-10-CM | POA: Diagnosis present

## 2017-05-02 DIAGNOSIS — Y92009 Unspecified place in unspecified non-institutional (private) residence as the place of occurrence of the external cause: Secondary | ICD-10-CM

## 2017-05-02 DIAGNOSIS — R509 Fever, unspecified: Secondary | ICD-10-CM

## 2017-05-02 DIAGNOSIS — Z79899 Other long term (current) drug therapy: Secondary | ICD-10-CM

## 2017-05-02 DIAGNOSIS — N390 Urinary tract infection, site not specified: Secondary | ICD-10-CM | POA: Diagnosis present

## 2017-05-02 DIAGNOSIS — Z841 Family history of disorders of kidney and ureter: Secondary | ICD-10-CM

## 2017-05-02 DIAGNOSIS — Z792 Long term (current) use of antibiotics: Secondary | ICD-10-CM

## 2017-05-02 DIAGNOSIS — B961 Klebsiella pneumoniae [K. pneumoniae] as the cause of diseases classified elsewhere: Secondary | ICD-10-CM | POA: Diagnosis present

## 2017-05-02 DIAGNOSIS — F259 Schizoaffective disorder, unspecified: Secondary | ICD-10-CM | POA: Diagnosis present

## 2017-05-02 DIAGNOSIS — F13939 Sedative, hypnotic or anxiolytic use, unspecified with withdrawal, unspecified: Secondary | ICD-10-CM | POA: Diagnosis present

## 2017-05-02 DIAGNOSIS — R410 Disorientation, unspecified: Secondary | ICD-10-CM

## 2017-05-02 DIAGNOSIS — F13239 Sedative, hypnotic or anxiolytic dependence with withdrawal, unspecified: Principal | ICD-10-CM | POA: Diagnosis present

## 2017-05-02 DIAGNOSIS — Z8249 Family history of ischemic heart disease and other diseases of the circulatory system: Secondary | ICD-10-CM

## 2017-05-02 DIAGNOSIS — B9689 Other specified bacterial agents as the cause of diseases classified elsewhere: Secondary | ICD-10-CM

## 2017-05-02 DIAGNOSIS — E870 Hyperosmolality and hypernatremia: Secondary | ICD-10-CM

## 2017-05-02 DIAGNOSIS — G9340 Encephalopathy, unspecified: Secondary | ICD-10-CM | POA: Diagnosis present

## 2017-05-02 DIAGNOSIS — D649 Anemia, unspecified: Secondary | ICD-10-CM | POA: Diagnosis present

## 2017-05-02 DIAGNOSIS — R4701 Aphasia: Secondary | ICD-10-CM | POA: Diagnosis present

## 2017-05-02 DIAGNOSIS — I10 Essential (primary) hypertension: Secondary | ICD-10-CM | POA: Diagnosis present

## 2017-05-02 DIAGNOSIS — G92 Toxic encephalopathy: Secondary | ICD-10-CM | POA: Diagnosis present

## 2017-05-02 DIAGNOSIS — N281 Cyst of kidney, acquired: Secondary | ICD-10-CM | POA: Diagnosis present

## 2017-05-02 DIAGNOSIS — E860 Dehydration: Secondary | ICD-10-CM | POA: Diagnosis present

## 2017-05-02 DIAGNOSIS — N179 Acute kidney failure, unspecified: Secondary | ICD-10-CM | POA: Diagnosis present

## 2017-05-02 LAB — ACETAMINOPHEN LEVEL: Acetaminophen (Tylenol), Serum: <10 ug/mL — ABNORMAL LOW (ref 10–30)

## 2017-05-02 LAB — COMPREHENSIVE METABOLIC PANEL
ALT: 17 U/L (ref 14–54)
AST: 23 U/L (ref 15–41)
Albumin: 5 g/dL (ref 3.5–5.0)
Alkaline Phosphatase: 107 U/L (ref 38–126)
Anion gap: 13 (ref 5–15)
BUN: 18 mg/dL (ref 6–20)
CO2: 25 mmol/L (ref 22–32)
Calcium: 11 mg/dL — ABNORMAL HIGH (ref 8.9–10.3)
Chloride: 101 mmol/L (ref 101–111)
Creatinine, Ser: 1.54 mg/dL — ABNORMAL HIGH (ref 0.44–1.00)
GFR calc Af Amer: 42 mL/min — ABNORMAL LOW (ref >60)
GFR calc non Af Amer: 37 mL/min — ABNORMAL LOW (ref >60)
Glucose, Bld: 155 mg/dL — ABNORMAL HIGH (ref 65–99)
Potassium: 3.4 mmol/L — ABNORMAL LOW (ref 3.5–5.1)
Sodium: 139 mmol/L (ref 135–145)
Total Bilirubin: 0.9 mg/dL (ref 0.3–1.2)
Total Protein: 8.8 g/dL — ABNORMAL HIGH (ref 6.5–8.1)

## 2017-05-02 LAB — RAPID URINE DRUG SCREEN, HOSP PERFORMED
Amphetamines: NOT DETECTED
Barbiturates: NOT DETECTED
Benzodiazepines: NOT DETECTED
Cocaine: NOT DETECTED
Opiates: NOT DETECTED
Tetrahydrocannabinol: NOT DETECTED

## 2017-05-02 LAB — URINALYSIS, ROUTINE W REFLEX MICROSCOPIC
Bilirubin Urine: NEGATIVE
Glucose, UA: NEGATIVE mg/dL
Hgb urine dipstick: NEGATIVE
Ketones, ur: 5 mg/dL — AB
Leukocytes, UA: NEGATIVE
Nitrite: NEGATIVE
Protein, ur: NEGATIVE mg/dL
Specific Gravity, Urine: 1.009 (ref 1.005–1.030)
pH: 5 (ref 5.0–8.0)

## 2017-05-02 LAB — CBC
HCT: 35.5 % — ABNORMAL LOW (ref 36.0–46.0)
Hemoglobin: 12.1 g/dL (ref 12.0–15.0)
MCH: 27.6 pg (ref 26.0–34.0)
MCHC: 34.1 g/dL (ref 30.0–36.0)
MCV: 81.1 fL (ref 78.0–100.0)
Platelets: 279 10*3/uL (ref 150–400)
RBC: 4.38 MIL/uL (ref 3.87–5.11)
RDW: 13.1 % (ref 11.5–15.5)
WBC: 13.3 10*3/uL — ABNORMAL HIGH (ref 4.0–10.5)

## 2017-05-02 LAB — LITHIUM LEVEL: Lithium Lvl: <0.06 mmol/L — ABNORMAL LOW (ref 0.60–1.20)

## 2017-05-02 LAB — SALICYLATE LEVEL: Salicylate Lvl: <7 mg/dL (ref 2.8–30.0)

## 2017-05-02 LAB — ETHANOL: Alcohol, Ethyl (B): <10 mg/dL (ref <10)

## 2017-05-02 LAB — I-STAT CG4 LACTIC ACID, ED: Lactic Acid, Venous: 1.84 mmol/L (ref 0.5–1.9)

## 2017-05-02 LAB — CBG MONITORING, ED: Glucose-Capillary: 148 mg/dL — ABNORMAL HIGH (ref 65–99)

## 2017-05-02 MED ORDER — SODIUM CHLORIDE 0.9 % IV BOLUS (SEPSIS)
1000.0000 mL | Freq: Once | INTRAVENOUS | Status: AC
  Administered 2017-05-02: 1000 mL via INTRAVENOUS

## 2017-05-02 NOTE — ED Notes (Signed)
Patient transported to CT 

## 2017-05-02 NOTE — ED Notes (Signed)
ED Provider at bedside. 

## 2017-05-02 NOTE — ED Triage Notes (Signed)
Per [facility] patient from home for calling EMS for confusion that started unknown time today. Patient AMS and aphasia, only answering simple answers.  EMS reports that [facility] instructed patient to come to [facility].  Patient very shaky and tearful.  Patient was ambulatory at scene without any assistance when fire arrived.  When patient got here to get into triage room from EMS stretcher needed assistance Vitals: CBg 167, HTN 164/110.  While sitting at triage desk getting report, patient ambulatory at in hallway to triage desk.

## 2017-05-03 ENCOUNTER — Other Ambulatory Visit: Payer: Self-pay

## 2017-05-03 ENCOUNTER — Encounter (HOSPITAL_COMMUNITY): Payer: Self-pay | Admitting: Internal Medicine

## 2017-05-03 DIAGNOSIS — R410 Disorientation, unspecified: Secondary | ICD-10-CM | POA: Diagnosis not present

## 2017-05-03 DIAGNOSIS — R4701 Aphasia: Secondary | ICD-10-CM | POA: Diagnosis present

## 2017-05-03 DIAGNOSIS — N281 Cyst of kidney, acquired: Secondary | ICD-10-CM | POA: Diagnosis present

## 2017-05-03 DIAGNOSIS — Z792 Long term (current) use of antibiotics: Secondary | ICD-10-CM | POA: Diagnosis not present

## 2017-05-03 DIAGNOSIS — Y92009 Unspecified place in unspecified non-institutional (private) residence as the place of occurrence of the external cause: Secondary | ICD-10-CM | POA: Diagnosis not present

## 2017-05-03 DIAGNOSIS — Z8249 Family history of ischemic heart disease and other diseases of the circulatory system: Secondary | ICD-10-CM | POA: Diagnosis not present

## 2017-05-03 DIAGNOSIS — F250 Schizoaffective disorder, bipolar type: Secondary | ICD-10-CM | POA: Diagnosis not present

## 2017-05-03 DIAGNOSIS — Z841 Family history of disorders of kidney and ureter: Secondary | ICD-10-CM | POA: Diagnosis not present

## 2017-05-03 DIAGNOSIS — Z79899 Other long term (current) drug therapy: Secondary | ICD-10-CM | POA: Diagnosis not present

## 2017-05-03 DIAGNOSIS — E870 Hyperosmolality and hypernatremia: Secondary | ICD-10-CM | POA: Diagnosis not present

## 2017-05-03 DIAGNOSIS — F13239 Sedative, hypnotic or anxiolytic dependence with withdrawal, unspecified: Secondary | ICD-10-CM | POA: Diagnosis present

## 2017-05-03 DIAGNOSIS — N179 Acute kidney failure, unspecified: Secondary | ICD-10-CM | POA: Diagnosis present

## 2017-05-03 DIAGNOSIS — I10 Essential (primary) hypertension: Secondary | ICD-10-CM | POA: Diagnosis present

## 2017-05-03 DIAGNOSIS — B961 Klebsiella pneumoniae [K. pneumoniae] as the cause of diseases classified elsewhere: Secondary | ICD-10-CM | POA: Diagnosis present

## 2017-05-03 DIAGNOSIS — E232 Diabetes insipidus: Secondary | ICD-10-CM | POA: Diagnosis present

## 2017-05-03 DIAGNOSIS — D649 Anemia, unspecified: Secondary | ICD-10-CM | POA: Diagnosis present

## 2017-05-03 DIAGNOSIS — F13939 Sedative, hypnotic or anxiolytic use, unspecified with withdrawal, unspecified: Secondary | ICD-10-CM | POA: Diagnosis present

## 2017-05-03 DIAGNOSIS — E860 Dehydration: Secondary | ICD-10-CM | POA: Diagnosis present

## 2017-05-03 DIAGNOSIS — G9340 Encephalopathy, unspecified: Secondary | ICD-10-CM

## 2017-05-03 DIAGNOSIS — R413 Other amnesia: Secondary | ICD-10-CM | POA: Diagnosis not present

## 2017-05-03 DIAGNOSIS — N390 Urinary tract infection, site not specified: Secondary | ICD-10-CM | POA: Diagnosis present

## 2017-05-03 DIAGNOSIS — F259 Schizoaffective disorder, unspecified: Secondary | ICD-10-CM | POA: Diagnosis present

## 2017-05-03 DIAGNOSIS — G92 Toxic encephalopathy: Secondary | ICD-10-CM | POA: Diagnosis present

## 2017-05-03 LAB — COMPREHENSIVE METABOLIC PANEL
ALBUMIN: 4.4 g/dL (ref 3.5–5.0)
ALT: 16 U/L (ref 14–54)
AST: 19 U/L (ref 15–41)
Alkaline Phosphatase: 92 U/L (ref 38–126)
Anion gap: 11 (ref 5–15)
BILIRUBIN TOTAL: 0.5 mg/dL (ref 0.3–1.2)
BUN: 15 mg/dL (ref 6–20)
CO2: 23 mmol/L (ref 22–32)
CREATININE: 1.25 mg/dL — AB (ref 0.44–1.00)
Calcium: 10.1 mg/dL (ref 8.9–10.3)
Chloride: 113 mmol/L — ABNORMAL HIGH (ref 101–111)
GFR calc Af Amer: 55 mL/min — ABNORMAL LOW (ref >60)
GFR, EST NON AFRICAN AMERICAN: 47 mL/min — AB (ref >60)
GLUCOSE: 91 mg/dL (ref 65–99)
Potassium: 3.6 mmol/L (ref 3.5–5.1)
Sodium: 147 mmol/L — ABNORMAL HIGH (ref 135–145)
TOTAL PROTEIN: 7.6 g/dL (ref 6.5–8.1)

## 2017-05-03 LAB — CBC WITH DIFFERENTIAL/PLATELET
BASOS PCT: 0 %
Basophils Absolute: 0 10*3/uL (ref 0.0–0.1)
EOS ABS: 0 10*3/uL (ref 0.0–0.7)
Eosinophils Relative: 0 %
HEMATOCRIT: 31.9 % — AB (ref 36.0–46.0)
HEMOGLOBIN: 10.9 g/dL — AB (ref 12.0–15.0)
LYMPHS ABS: 2.2 10*3/uL (ref 0.7–4.0)
Lymphocytes Relative: 16 %
MCH: 27.6 pg (ref 26.0–34.0)
MCHC: 34.2 g/dL (ref 30.0–36.0)
MCV: 80.8 fL (ref 78.0–100.0)
MONO ABS: 1.4 10*3/uL — AB (ref 0.1–1.0)
MONOS PCT: 11 %
NEUTROS ABS: 9.7 10*3/uL — AB (ref 1.7–7.7)
NEUTROS PCT: 73 %
Platelets: 233 10*3/uL (ref 150–400)
RBC: 3.95 MIL/uL (ref 3.87–5.11)
RDW: 13.1 % (ref 11.5–15.5)
WBC: 13.3 10*3/uL — ABNORMAL HIGH (ref 4.0–10.5)

## 2017-05-03 LAB — URINALYSIS, ROUTINE W REFLEX MICROSCOPIC
Bilirubin Urine: NEGATIVE
Glucose, UA: NEGATIVE mg/dL
Hgb urine dipstick: NEGATIVE
Ketones, ur: NEGATIVE mg/dL
Nitrite: NEGATIVE
Protein, ur: NEGATIVE mg/dL
Specific Gravity, Urine: 1.005 (ref 1.005–1.030)
pH: 5 (ref 5.0–8.0)

## 2017-05-03 LAB — MAGNESIUM: MAGNESIUM: 2.6 mg/dL — AB (ref 1.7–2.4)

## 2017-05-03 LAB — I-STAT CHEM 8, ED
BUN: 15 mg/dL (ref 6–20)
CHLORIDE: 108 mmol/L (ref 101–111)
CREATININE: 1.4 mg/dL — AB (ref 0.44–1.00)
Calcium, Ion: 1.28 mmol/L (ref 1.15–1.40)
GLUCOSE: 108 mg/dL — AB (ref 65–99)
HEMATOCRIT: 35 % — AB (ref 36.0–46.0)
Hemoglobin: 11.9 g/dL — ABNORMAL LOW (ref 12.0–15.0)
POTASSIUM: 3.7 mmol/L (ref 3.5–5.1)
Sodium: 145 mmol/L (ref 135–145)
TCO2: 24 mmol/L (ref 22–32)

## 2017-05-03 LAB — ACETAMINOPHEN LEVEL: Acetaminophen (Tylenol), Serum: <10 ug/mL — ABNORMAL LOW (ref 10–30)

## 2017-05-03 LAB — AMMONIA: Ammonia: 24 umol/L (ref 9–35)

## 2017-05-03 LAB — TSH: TSH: 0.356 u[IU]/mL (ref 0.350–4.500)

## 2017-05-03 LAB — CK: Total CK: 207 U/L (ref 38–234)

## 2017-05-03 LAB — HIV ANTIBODY (ROUTINE TESTING W REFLEX): HIV Screen 4th Generation wRfx: NONREACTIVE

## 2017-05-03 LAB — OSMOLALITY: Osmolality: 302 mOsm/kg — ABNORMAL HIGH (ref 275–295)

## 2017-05-03 LAB — MRSA PCR SCREENING: MRSA by PCR: NEGATIVE

## 2017-05-03 MED ORDER — SODIUM CHLORIDE 0.9 % IV SOLN
INTRAVENOUS | Status: AC
  Administered 2017-05-03: 11:00:00 via INTRAVENOUS
  Administered 2017-05-03: 100 mL/h via INTRAVENOUS

## 2017-05-03 MED ORDER — LORAZEPAM 2 MG/ML IJ SOLN
2.0000 mg | INTRAMUSCULAR | Status: AC
  Administered 2017-05-03: 2 mg via INTRAVENOUS

## 2017-05-03 MED ORDER — SODIUM CHLORIDE 0.9 % IV BOLUS (SEPSIS)
500.0000 mL | Freq: Once | INTRAVENOUS | Status: AC
  Administered 2017-05-03: 500 mL via INTRAVENOUS

## 2017-05-03 MED ORDER — LORAZEPAM 2 MG/ML IJ SOLN
2.0000 mg | Freq: Four times a day (QID) | INTRAMUSCULAR | Status: DC
  Administered 2017-05-03 – 2017-05-05 (×6): 2 mg via INTRAVENOUS
  Filled 2017-05-03 (×6): qty 1

## 2017-05-03 MED ORDER — LORAZEPAM 1 MG PO TABS: 2.0000 mg | ORAL_TABLET | Freq: Every day | ORAL | Status: DC

## 2017-05-03 MED ORDER — LORAZEPAM 2 MG/ML IJ SOLN
1.0000 mg | Freq: Once | INTRAMUSCULAR | Status: AC
  Administered 2017-05-03: 1 mg via INTRAVENOUS
  Filled 2017-05-03: qty 1

## 2017-05-03 MED ORDER — LORAZEPAM 2 MG/ML IJ SOLN
1.0000 mg | INTRAMUSCULAR | Status: DC | PRN
  Administered 2017-05-04: 1 mg via INTRAVENOUS
  Filled 2017-05-03: qty 1

## 2017-05-03 MED ORDER — LORAZEPAM 2 MG/ML IJ SOLN
1.0000 mg | Freq: Four times a day (QID) | INTRAMUSCULAR | Status: DC | PRN
  Filled 2017-05-03: qty 1

## 2017-05-03 MED ORDER — LORAZEPAM 2 MG/ML IJ SOLN
2.0000 mg | INTRAMUSCULAR | Status: DC | PRN
  Administered 2017-05-03: 2 mg via INTRAVENOUS
  Filled 2017-05-03: qty 1

## 2017-05-03 MED ORDER — FOLIC ACID 5 MG/ML IJ SOLN
1.0000 mg | Freq: Every day | INTRAMUSCULAR | Status: DC
  Administered 2017-05-03 – 2017-05-07 (×5): 1 mg via INTRAVENOUS
  Filled 2017-05-03 (×5): qty 0.2

## 2017-05-03 MED ORDER — THIAMINE HCL 100 MG/ML IJ SOLN
100.0000 mg | Freq: Every day | INTRAMUSCULAR | Status: DC
  Administered 2017-05-03: 100 mg via INTRAVENOUS
  Filled 2017-05-03: qty 2

## 2017-05-03 MED ORDER — HYDRALAZINE HCL 20 MG/ML IJ SOLN
10.0000 mg | INTRAMUSCULAR | Status: DC | PRN
  Administered 2017-05-03: 10 mg via INTRAVENOUS
  Filled 2017-05-03 (×2): qty 1

## 2017-05-03 MED ORDER — ACETAMINOPHEN 325 MG PO TABS
650.0000 mg | ORAL_TABLET | Freq: Four times a day (QID) | ORAL | Status: DC | PRN
  Administered 2017-05-03 – 2017-05-06 (×2): 650 mg via ORAL
  Filled 2017-05-03 (×2): qty 2

## 2017-05-03 MED ORDER — ACETAMINOPHEN 650 MG RE SUPP: 650.0000 mg | Freq: Four times a day (QID) | RECTAL | Status: DC | PRN

## 2017-05-03 MED ORDER — LORAZEPAM 1 MG PO TABS: 1.0000 mg | ORAL_TABLET | Freq: Every day | ORAL | Status: DC

## 2017-05-03 MED ORDER — HALOPERIDOL LACTATE 5 MG/ML IJ SOLN
5.0000 mg | Freq: Once | INTRAMUSCULAR | Status: AC
  Administered 2017-05-03: 5 mg via INTRAVENOUS
  Filled 2017-05-03: qty 1

## 2017-05-03 MED ORDER — BENZTROPINE MESYLATE 0.5 MG PO TABS
0.5000 mg | ORAL_TABLET | Freq: Two times a day (BID) | ORAL | Status: DC
  Administered 2017-05-03: 0.5 mg via ORAL
  Filled 2017-05-03: qty 1

## 2017-05-03 MED ORDER — RISPERIDONE 2 MG PO TABS
2.0000 mg | ORAL_TABLET | Freq: Two times a day (BID) | ORAL | Status: DC
  Administered 2017-05-03: 4 mg via ORAL
  Filled 2017-05-03: qty 2

## 2017-05-03 MED ORDER — THIAMINE HCL 100 MG/ML IJ SOLN
Freq: Once | INTRAVENOUS | Status: AC
  Administered 2017-05-03: 04:00:00 via INTRAVENOUS
  Filled 2017-05-03: qty 1000

## 2017-05-03 NOTE — H&P (Addendum)
History and Physical    Tara Hurst SWN:[id number] DOB: [birth year] DOA: 05/02/2017  PCP: Patient, No Pcp Per  Patient coming from: Home.  Chief Complaint: Not feeling well.  HPI: Tara Hurst is a 56 y.o. female with history of schizoaffective disorder, hypertension was brought to the ER by EMS after patient called EMS patient was not feeling well.  Patient states that over the last few days she has not taken her medications since she thought it would help her.  Denies any chest pain shortness of breath nausea vomiting diarrhea headache or visual symptoms.   ED Course:  In the ER patient is found to be tremulous tachycardic and has some difficulty to bring out words.  Patient is also confused.  UA urine drug screen chest x-ray all unremarkable.  Patient is not able to give a clear history stating only that she has not taken her medications but not able to say how long she did not take it.  The ER patient was given a dose of Cogentin and Risperdal despite which patient remained tachycardic.  Patient was given 1 dose of Haldol 5 mg IV.  Despite all these which has patient continues to remain tachycardic and confused.  Patient is afebrile.  Is oriented to her name and place.  Follows commands and moves all extremities.  Patient is admitted for acute encephalopathy with tremors.   Review of Systems: As per HPI, rest all negative.   Past Medical History:  Diagnosis Date  . Hypertension   . Schizoaffective disorder ([place])     History reviewed. No pertinent surgical history.   reports that  has never smoked. she has never used smokeless tobacco. She reports that she does not drink alcohol or use drugs.  No Known Allergies  Family History  Problem Relation Age of Onset  . Heart disease Mother   . Diabetic kidney disease Mother     Prior to Admission medications   Medication Sig Start Date End Date Taking? Authorizing Provider  benztropine (COGENTIN) 0.5 MG tablet Take 1 tablet (0.5  mg total) by mouth 2 (two) times daily. 09/28/13   Kinnie Feil, MD  diphenhydrAMINE (BENADRYL) 25 mg capsule Take 1 capsule (25 mg total) by mouth 2 (two) times daily as needed for sleep (Extrapyramidal symptoms). 09/28/13   Kinnie Feil, MD  hydrOXYzine (VISTARIL) 50 MG capsule Take 50 mg by mouth at bedtime.    [provider]  levofloxacin (LEVAQUIN) 750 MG tablet Take 1 tablet (750 mg total) by mouth every other day. 09/28/13   Kinnie Feil, MD  LORAZEPAM PO Take by mouth.    [provider]  risperiDONE (RISPERDAL) 2 MG tablet Take 1-2 tablets (2-4 mg total) by mouth 2 (two) times daily. 2mg  every morning and 4mg  nightly 09/28/13   Kinnie Feil, MD    Physical Exam: Vitals:   05/02/17 2330 05/03/17 0030 05/03/17 0130 05/03/17 0230  BP: (!) 157/108 (!) 163/115 (!) 141/92 (!) 144/105  Pulse: (!) 132 (!) 137 (!) 131 (!) 128  Resp: (!) 23 (!) 31 15 12   Temp:      TempSrc:      SpO2: 100% 100% 100% 100%      Constitutional: Moderately built and nourished. Vitals:   05/02/17 2330 05/03/17 0030 05/03/17 0130 05/03/17 0230  BP: (!) 157/108 (!) 163/115 (!) 141/92 (!) 144/105  Pulse: (!) 132 (!) 137 (!) 131 (!) 128  Resp: (!) 23 (!) 31 15 12  Temp:      TempSrc:      SpO2: 100% 100% 100% 100%   Eyes: Anicteric no pallor. ENMT: No discharge from the ears eyes nose or mouth. Neck: No mass felt.  No neck rigidity. Respiratory: No rhonchi or crepitations. Cardiovascular: S1-S2 no murmurs appreciated.  Tachycardic. Abdomen: Soft nontender bowel sounds present. Musculoskeletal: No edema.  No joint effusion. Skin: No rash.  Skin appears warm. Neurologic: Alert awake oriented to time and place.  Appears mildly confused.  Cranial nerves.  Moves all extremities. Psychiatric:  appears confused.   Labs on Admission: I have personally reviewed following labs and imaging studies  CBC: Recent Labs  Lab 05/02/17 1918 05/03/17 0358  WBC 13.3*  --   HGB 12.1  11.9*  HCT 35.5* 35.0*  MCV 81.1  --   PLT 279  --    Basic Metabolic Panel: Recent Labs  Lab 05/02/17 1918 05/03/17 0358  NA 139 145  K 3.4* 3.7  CL 101 108  CO2 25  --   GLUCOSE 155* 108*  BUN 18 15  CREATININE 1.54* 1.40*  CALCIUM 11.0*  --    GFR: CrCl cannot be calculated (Unknown ideal weight.). Liver Function Tests: Recent Labs  Lab 05/02/17 1918  AST 23  ALT 17  ALKPHOS 107  BILITOT 0.9  PROT 8.8*  ALBUMIN 5.0   No results for input(s): LIPASE, AMYLASE in the last 168 hours. No results for input(s): AMMONIA in the last 168 hours. Coagulation Profile: No results for input(s): INR, PROTIME in the last 168 hours. Cardiac Enzymes: No results for input(s): CKTOTAL, CKMB, CKMBINDEX, TROPONINI in the last 168 hours. BNP (last 3 results) No results for input(s): PROBNP in the last 8760 hours. HbA1C: No results for input(s): HGBA1C in the last 72 hours. CBG: Recent Labs  Lab 05/02/17 1923  GLUCAP 148*   Lipid Profile: No results for input(s): CHOL, HDL, LDLCALC, TRIG, CHOLHDL, LDLDIRECT in the last 72 hours. Thyroid Function Tests: No results for input(s): TSH, T4TOTAL, FREET4, T3FREE, THYROIDAB in the last 72 hours. Anemia Panel: No results for input(s): VITAMINB12, FOLATE, FERRITIN, TIBC, IRON, RETICCTPCT in the last 72 hours. Urine analysis:    Component Value Date/Time   COLORURINE YELLOW 05/02/2017 2035   APPEARANCEUR CLEAR 05/02/2017 2035   LABSPEC 1.009 05/02/2017 2035   PHURINE 5.0 05/02/2017 2035   GLUCOSEU NEGATIVE 05/02/2017 2035   HGBUR NEGATIVE 05/02/2017 2035   BILIRUBINUR NEGATIVE 05/02/2017 2035   KETONESUR 5 (A) 05/02/2017 2035   PROTEINUR NEGATIVE 05/02/2017 2035   UROBILINOGEN 0.2 09/25/2013 1322   NITRITE NEGATIVE 05/02/2017 2035   LEUKOCYTESUR NEGATIVE 05/02/2017 2035   Sepsis Labs: @LABRCNTIP (procalcitonin:4,lacticidven:4) )No results found for this or any previous visit (from the past 240 hour(s)).   Radiological Exams on  Admission: Ct Head Wo Contrast  Result Date: 05/02/2017 CLINICAL DATA:  Confusion and difficulty speaking. EXAM: CT HEAD WITHOUT CONTRAST TECHNIQUE: Contiguous axial images were obtained from the base of the skull through the vertex without intravenous contrast. COMPARISON:  None. FINDINGS: Brain: The brainstem, cerebellum, cerebral peduncles, thalami, basal ganglia, basilar cisterns, and ventricular system appear within normal limits. No intracranial hemorrhage, mass lesion, or acute CVA. Vascular: Unremarkable Skull: Unremarkable Sinuses/Orbits: Unremarkable Other: No supplemental non-categorized findings. IMPRESSION: 1. No significant intracranial abnormality to explain the patient's current symptoms. Electronically Signed   By: Van Clines M.D.   On: 05/02/2017 20:13   Dg Chest Portable 1 View  Result Date: 05/02/2017 CLINICAL DATA:  Altered mental  status and fever. EXAM: PORTABLE CHEST 1 VIEW COMPARISON:  09/25/2013 chest radiograph FINDINGS: The cardiomediastinal silhouette is unremarkable. There is no evidence of focal airspace disease, pulmonary edema, suspicious pulmonary nodule/mass, pleural effusion, or pneumothorax. No acute bony abnormalities are identified. IMPRESSION: No evidence of active cardiopulmonary disease. Electronically Signed   By: Margarette Canada M.D.   On: 05/02/2017 20:31    EKG: Independently reviewed.  Sinus tachycardia.  Assessment/Plan Principal Problem:   Acute encephalopathy Active Problems:   Schizoaffective disorder (HCC)   Hypertension   ARF (acute renal failure) ([place])    1. Acute encephalopathy with tremors -cause not clear.  Patient states she has not taken her medications for last many days but does not know the exact duration and history is not very reliable since patient is also confused.  For now I have dosed 1 dose of Ativan 1 mg IV and based on the response we will have to consider dosing more Ativan as needed if there is good response. May  consider MRI brain if symptoms do not improve.  Will check TSH ammonia levels and repeat labs.  May get a psychiatric consult in a.m.  Patient denies any suicidal ideation. 2. History of hypertension per the chart -I have placed patient on as needed hydralazine for now.  Closely follow blood pressure trends. 3. History of schizoaffective disorder -will need psychiatric input before restarting her home medications. 4. Acute renal failure possible dehydration.  Patient is receiving 4 units.  Recheck metabolic panel closely follow intake output.  I have reviewed patient's old charts and labs.   DVT prophylaxis: SCDs. Code Status: Full. Family Communication: No family at the bedside. Disposition Plan: To be determined. Consults called: None. Admission status: Observation.   Rise Patience MD [facility] Pager [phone].  If 7PM-7AM, please contact night-coverage [email or web address] Password [facility]  05/03/2017, 4:49 AM

## 2017-05-03 NOTE — ED Provider Notes (Signed)
[facility] DEPT Provider Note   CSN: [id number] Arrival date & time: 05/02/17  [place]     History   Chief Complaint Chief Complaint  Patient presents with  . Altered Mental Status  . Aphasia    HPI Tara Hurst is a 56 y.o. female.  HPI   21yF with brought in by EMS for altered mental status. Apparently pt called herself to their residence. Unclear onset. No reported trauma or ingestion. Pt's speech is mostly not understandable. Did shake head "no" when asked if having any pain.   Past Medical History:  Diagnosis Date  . Hypertension   . Schizoaffective disorder [facility])     Patient Active Problem List   Diagnosis Date Noted  . HCAP (healthcare-associated pneumonia) 09/25/2013  . Schizoaffective disorder ([place]) 09/25/2013  . Hypertension 09/25/2013  . Abnormal lithium level in blood 09/25/2013    History reviewed. No pertinent surgical history.  OB History    No data available       Home Medications    Prior to Admission medications   Medication Sig Start Date End Date Taking? Authorizing Provider  benztropine (COGENTIN) 0.5 MG tablet Take 1 tablet (0.5 mg total) by mouth 2 (two) times daily. 09/28/13   Kinnie Feil, MD  diphenhydrAMINE (BENADRYL) 25 mg capsule Take 1 capsule (25 mg total) by mouth 2 (two) times daily as needed for sleep (Extrapyramidal symptoms). 09/28/13   Kinnie Feil, MD  hydrOXYzine (VISTARIL) 50 MG capsule Take 50 mg by mouth at bedtime.    [provider]  levofloxacin (LEVAQUIN) 750 MG tablet Take 1 tablet (750 mg total) by mouth every other day. 09/28/13   Kinnie Feil, MD  LORAZEPAM PO Take by mouth.    [provider]  risperiDONE (RISPERDAL) 2 MG tablet Take 1-2 tablets (2-4 mg total) by mouth 2 (two) times daily. 2mg  every morning and 4mg  nightly 09/28/13   Kinnie Feil, MD    Family History Family History  Problem Relation Age of Onset  . Heart disease Mother   .  Diabetic kidney disease Mother     Social History Social History   Tobacco Use  . Smoking status: Never Smoker  . Smokeless tobacco: Never Used  Substance Use Topics  . Alcohol use: No  . Drug use: No     Allergies   Patient has no known allergies.   Review of Systems Review of Systems  Level 5 caveat because nonverbal.   Physical Exam Updated Vital Signs BP (!) 145/121   Pulse (!) 121   Temp 98.9 F (37.2 C) (Rectal)   Resp 19   SpO2 100%   Physical Exam  Constitutional: She appears well-developed and well-nourished. No distress.  Disheveled appearance  HENT:  Head: Normocephalic and atraumatic.  Eyes: Conjunctivae are normal. Right eye exhibits no discharge. Left eye exhibits no discharge.  Neck: Neck supple.  Cardiovascular: Regular rhythm and normal heart sounds. Exam reveals no gallop and no friction rub.  No murmur heard. Tachycardic  Pulmonary/Chest: Effort normal and breath sounds normal. No respiratory distress.  Abdominal: Soft. She exhibits no distension. There is no tenderness.  Musculoskeletal: She exhibits no edema or tenderness.  No external signs of acute trauma  Neurological: She is alert.  Laying in bed with eyes open.  Poor eye contact.  Will respond to questioning but answers are unintelligible.  Intermittently following simple commands such as squeezing fingers and moving her feet.  She does move  all extremities.  Cranial nerves appear to be grossly intact.  Skin: Skin is warm and dry.  Nursing note and vitals reviewed.    ED Treatments / Results  Labs (all labs ordered are listed, but only abnormal results are displayed) Labs Reviewed  COMPREHENSIVE METABOLIC PANEL - Abnormal; Notable for the following components:      Result Value   Potassium 3.4 (*)    Glucose, Bld 155 (*)    Creatinine, Ser 1.54 (*)    Calcium 11.0 (*)    Total Protein 8.8 (*)    GFR calc non Af Amer 37 (*)    GFR calc Af Amer 42 (*)    All other components  within normal limits  CBC - Abnormal; Notable for the following components:   WBC 13.3 (*)    HCT 35.5 (*)    All other components within normal limits  URINALYSIS, ROUTINE W REFLEX MICROSCOPIC - Abnormal; Notable for the following components:   Ketones, ur 5 (*)    All other components within normal limits  ACETAMINOPHEN LEVEL - Abnormal; Notable for the following components:   Acetaminophen (Tylenol), Serum <10 (*)    All other components within normal limits  LITHIUM LEVEL - Abnormal; Notable for the following components:   Lithium Lvl <0.06 (*)    All other components within normal limits  CBG MONITORING, ED - Abnormal; Notable for the following components:   Glucose-Capillary 148 (*)    All other components within normal limits  SALICYLATE LEVEL  RAPID URINE DRUG SCREEN, HOSP PERFORMED  ETHANOL  I-STAT CG4 LACTIC ACID, ED    EKG  EKG Interpretation  Date/Time:  Wednesday May 02 2017 19:00:53 EST Ventricular Rate:  135 PR Interval:    QRS Duration: 206 QT Interval:  385 QTC Calculation: 578 R Axis:   44 Text Interpretation:  Sinus tachycardia Consider right atrial enlargement Interpretation limited secondary to artifact Confirmed by Virgel Manifold [phone]) on 05/02/2017 8:41:07 PM       Radiology Ct Head Wo Contrast  Result Date: 05/02/2017 CLINICAL DATA:  Confusion and difficulty speaking. EXAM: CT HEAD WITHOUT CONTRAST TECHNIQUE: Contiguous axial images were obtained from the base of the skull through the vertex without intravenous contrast. COMPARISON:  None. FINDINGS: Brain: The brainstem, cerebellum, cerebral peduncles, thalami, basal ganglia, basilar cisterns, and ventricular system appear within normal limits. No intracranial hemorrhage, mass lesion, or acute CVA. Vascular: Unremarkable Skull: Unremarkable Sinuses/Orbits: Unremarkable Other: No supplemental non-categorized findings. IMPRESSION: 1. No significant intracranial abnormality to explain the patient's  current symptoms. Electronically Signed   By: Van Clines M.D.   On: 05/02/2017 20:13   Dg Chest Portable 1 View  Result Date: 05/02/2017 CLINICAL DATA:  Altered mental status and fever. EXAM: PORTABLE CHEST 1 VIEW COMPARISON:  09/25/2013 chest radiograph FINDINGS: The cardiomediastinal silhouette is unremarkable. There is no evidence of focal airspace disease, pulmonary edema, suspicious pulmonary nodule/mass, pleural effusion, or pneumothorax. No acute bony abnormalities are identified. IMPRESSION: No evidence of active cardiopulmonary disease. Electronically Signed   By: Margarette Canada M.D.   On: 05/02/2017 20:31    Procedures Procedures (including critical care time)  Medications Ordered in ED Medications  benztropine (COGENTIN) tablet 0.5 mg (not administered)  risperiDONE (RISPERDAL) tablet 2-4 mg (not administered)  sodium chloride 0.9 % bolus 1,000 mL (1,000 mLs Intravenous New Bag/Given 05/02/17 2330)     Initial Impression / Assessment and Plan / ED Course  I have reviewed the triage vital signs and the nursing  notes.  Pertinent labs & imaging results that were available during my care of the patient were reviewed by me and considered in my medical decision making (see chart for details).     56 year old female with bizarre behavior.  Suspect psychogenic.  Exam is very consistent.  On my initial exam and she seemed aphasic.  Would not consistently follow commands, and when she did she was barely moving her extremities.  She was observed ambulating in the ED shortly before my evaluation though.  Subsequently speaking to me.  Keeps telling me that she "got off schedule" and "I need to get back on track."  She says she has not been taking her medications.  She cannot tell me what she specifically takes though or the last time she took them.  She does seem to be responding to internal stimuli.  At this point, I have a low suspicion for an acute medical process.  She is  persistently tachycardic, but she is afebrile.  Her labs have been reassuring.  Some abnormalities but appear to be near her baseline. She denies any acute pain.  She denies any ingestion. We will give some IV fluids.  TTS eval.    Final Clinical Impressions(s) / ED Diagnoses   Final diagnoses:  Confusion    ED Discharge Orders    None       Virgel Manifold, MD 05/03/17 [phone]

## 2017-05-03 NOTE — ED Notes (Signed)
Attempted to call report  Nurse in room with pt  Will return call

## 2017-05-03 NOTE — Progress Notes (Signed)
PROGRESS NOTE    Tara Hurst  WUJ:[id number] DOB: [birth year] DOA: 05/02/2017 PCP: Patient, No Pcp Per   Brief Narrative:  Tara Hurst is Tara Hurst 56 y.o. female with history of schizoaffective disorder, hypertension was brought to the ER by EMS after patient called EMS patient was not feeling well.  Patient states that over the last few days she has not taken her medications since she thought it would help her.  Denies any chest pain shortness of breath nausea vomiting diarrhea headache or visual symptoms.   Assessment & Plan:   Principal Problem:   Acute encephalopathy Active Problems:   Schizoaffective disorder ([place])   Hypertension   ARF (acute renal failure) ([place])   1. Acute encephalopathy with tremors - Concern for NMS vs serotonin syndrome given sx.  Some cogwheeling on my exam, no clear clonus or hyperreflexia.  No true fevers, but temp 100.1 this morning.  She's also not on serotonergic agents at home (she denied OTC meds to me), though it's been difficult to confirm her meds.   1. CK pending, Ammonia pending, TSH normal 2. Negative urine tox, etoh, salicylates (will add on APAP, but low suspicion).  Head CT unrevealing.  CXR without concerning findings. 3. Concern for serotonin syndrome or NMS.  Checking CK as above.  Also will order ativan 1 mg q4 prn.  Continue IVF.  2. Tachycardia: suspect due to above.  Continue IVF, telemetry.  3. Leukocytosis: no si/sx of infection, ctm.  4. History of hypertension per the chart -I have placed patient on as needed hydralazine for now.  Closely follow blood pressure trends. 5. History of schizoaffective disorder -will need psychiatric input before restarting her home medications.  Will hold off for now until neuro eval.  6. Acute renal failure possible dehydration.  Improving with IVF, continue to monitor.  7. Urinary retention: pt with >999 on bladder scan this morning.  Foley placed.  UA and cx ordered.  Ctm.    DVT prophylaxis: SCD Code  Status: full  Family Communication: none at bedside Disposition Plan: pending improvemment  Consultants:   neurology  Procedures: (Don't include imaging studies which can be auto populated. Include things that cannot be auto populated i.e. Echo, Carotid and venous dopplers, Foley, Bipap, HD, tubes/drains, wound vac, central lines etc)  none  Antimicrobials: (specify start and planned stop date. Auto populated tables are space occupying and do not give end dates)  none    Subjective: Difficult as pt with word finding difficulties. 911 brought her to hospital. "I feel ..." Sx been going on 3 days. Not clear if she was taking meds or not.  Denies new medicine. Feels confused.  Objective: Vitals:   05/03/17 0605 05/03/17 0652 05/03/17 0800 05/03/17 1200  BP:  (!) 148/102    Pulse:  (!) 119    Resp:  16    Temp: 98.7 F (37.1 C)  100.1 F (37.8 C) 99.1 F (37.3 C)  TempSrc: Oral  Oral Oral  SpO2:  99%      Intake/Output Summary (Last 24 hours) at 05/03/2017 1434 Last data filed at 05/03/2017 1000 Gross per 24 hour  Intake 1000 ml  Output 1480 ml  Net -480 ml   There were no vitals filed for this visit.  Examination:  General exam: Tremulous Respiratory system: Clear to auscultation. Respiratory effort normal. Cardiovascular system: S1 & S2 heard, RRR, tachycardic. No JVD, murmurs, rubs, gallops or clicks. No pedal edema. Gastrointestinal system: Abdomen is nondistended, soft  and nontender. No organomegaly or masses felt. Normal bowel sounds heard. Central nervous system: CN 2-12 grossly intact.  Cogwheeling.  Symmetric reflexes.  No notable clonus to LE on my exam.  Moves all extremities equally.  Word finding difficulty.  Extremities: No LEE.  Skin: No rashes, lesions or ulcers Psychiatry: Judgement and insight appear impaired.     Data Reviewed: I have personally reviewed following labs and imaging studies  CBC: Recent Labs  Lab 05/02/17 1918  05/03/17 0358 05/03/17 0709  WBC 13.3*  --  13.3*  NEUTROABS  --   --  9.7*  HGB 12.1 11.9* 10.9*  HCT 35.5* 35.0* 31.9*  MCV 81.1  --  80.8  PLT 279  --  591   Basic Metabolic Panel: Recent Labs  Lab 05/02/17 1918 05/03/17 0358 05/03/17 0709  NA 139 145 147*  K 3.4* 3.7 3.6  CL 101 108 113*  CO2 25  --  23  GLUCOSE 155* 108* 91  BUN 18 15 15   CREATININE 1.54* 1.40* 1.25*  CALCIUM 11.0*  --  10.1  MG  --   --  2.6*   GFR: CrCl cannot be calculated (Unknown ideal weight.). Liver Function Tests: Recent Labs  Lab 05/02/17 1918 05/03/17 0709  AST 23 19  ALT 17 16  ALKPHOS 107 92  BILITOT 0.9 0.5  PROT 8.8* 7.6  ALBUMIN 5.0 4.4   No results for input(s): LIPASE, AMYLASE in the last 168 hours. No results for input(s): AMMONIA in the last 168 hours. Coagulation Profile: No results for input(s): INR, PROTIME in the last 168 hours. Cardiac Enzymes: No results for input(s): CKTOTAL, CKMB, CKMBINDEX, TROPONINI in the last 168 hours. BNP (last 3 results) No results for input(s): PROBNP in the last 8760 hours. HbA1C: No results for input(s): HGBA1C in the last 72 hours. CBG: Recent Labs  Lab 05/02/17 1923  GLUCAP 148*   Lipid Profile: No results for input(s): CHOL, HDL, LDLCALC, TRIG, CHOLHDL, LDLDIRECT in the last 72 hours. Thyroid Function Tests: Recent Labs    05/03/17 0709  TSH 0.356   Anemia Panel: No results for input(s): VITAMINB12, FOLATE, FERRITIN, TIBC, IRON, RETICCTPCT in the last 72 hours. Sepsis Labs: Recent Labs  Lab 05/02/17 1936  LATICACIDVEN 1.84    Recent Results (from the past 240 hour(s))  MRSA PCR Screening     Status: None   Collection Time: 05/03/17  6:40 AM  Result Value Ref Range Status   MRSA by PCR NEGATIVE NEGATIVE Final    Comment:        The GeneXpert MRSA Assay (FDA approved for NASAL specimens only), is one component of Tara Hurst comprehensive MRSA colonization surveillance program. It is not intended to diagnose  MRSA infection nor to guide or monitor treatment for MRSA infections.          Radiology Studies: Ct Head Wo Contrast  Result Date: 05/02/2017 CLINICAL DATA:  Confusion and difficulty speaking. EXAM: CT HEAD WITHOUT CONTRAST TECHNIQUE: Contiguous axial images were obtained from the base of the skull through the vertex without intravenous contrast. COMPARISON:  None. FINDINGS: Brain: The brainstem, cerebellum, cerebral peduncles, thalami, basal ganglia, basilar cisterns, and ventricular system appear within normal limits. No intracranial hemorrhage, mass lesion, or acute CVA. Vascular: Unremarkable Skull: Unremarkable Sinuses/Orbits: Unremarkable Other: No supplemental non-categorized findings. IMPRESSION: 1. No significant intracranial abnormality to explain the patient's current symptoms. Electronically Signed   By: Van Clines M.D.   On: 05/02/2017 20:13   Dg Chest Portable 1  View  Result Date: 05/02/2017 CLINICAL DATA:  Altered mental status and fever. EXAM: PORTABLE CHEST 1 VIEW COMPARISON:  09/25/2013 chest radiograph FINDINGS: The cardiomediastinal silhouette is unremarkable. There is no evidence of focal airspace disease, pulmonary edema, suspicious pulmonary nodule/mass, pleural effusion, or pneumothorax. No acute bony abnormalities are identified. IMPRESSION: No evidence of active cardiopulmonary disease. Electronically Signed   By: Margarette Canada M.D.   On: 05/02/2017 20:31        Scheduled Meds: Continuous Infusions: . sodium chloride 100 mL/hr at 05/03/17 1038     LOS: 0 days    Time spent: over 30 minutes    Fayrene Helper, MD [facility] Pager [phone]   If 7PM-7AM, please contact night-coverage [email or web address] Password [facility] 05/03/2017, 2:34 PM

## 2017-05-03 NOTE — Care Management Note (Signed)
Case Management Note  Patient Details  Name: ANISTYN GRADDY MRN: [id number] Date of Birth: 01/10/1961  Subjective/Objective:                  ams and sepsis  Action/Plan: Date: May 03, 2017 Velva Harman, BSN, [place], [place] Chart and notes review for patient progress and needs. Will follow for case management and discharge needs. Next review date: [id number]  Expected Discharge Date:                  Expected Discharge Plan:  Home/Self Care  In-House Referral:     Discharge planning Services  CM Consult  Post Acute Care Choice:    Choice offered to:     DME Arranged:    DME Agency:     HH Arranged:    [place] Agency:     Status of Service:  In process, will continue to follow  If discussed at Long Length of Stay Meetings, dates discussed:    Additional Comments:  Leeroy Cha, RN 05/03/2017, 8:38 AM

## 2017-05-03 NOTE — BH Assessment (Signed)
  [place] ASSESSMENT [facility] attempted to complete assessment but was unable to do so because pt's speech is intelligible at this time.  TTS will try to assess patient at a later time.  Tara Hurst L. Sukhmani Fetherolf, MS, LPCA, [facility] Therapeutic Triage Specialist  [phone]

## 2017-05-03 NOTE — Progress Notes (Addendum)
Patient is unwilling or unable to be interviewed to verify the medications she was taking at home. There have been no visitors since she was admitted ~20 hours ago. We are unable to verify what she was taking but we added last-filled dates to medications if that information was available.   Romeo Rabon, PharmD. Mobile: [phone]. 05/03/2017,1:47 PM.

## 2017-05-03 NOTE — Consult Note (Signed)
NEURO HOSPITALIST CONSULT NOTE   Requestig physician: Dr. Florene Glen   Reason for Consult: NMS   History obtained from: Chart     HPI:                                                                                                                                         Patient currently unable to give history so most of history is obtained through the chart   Tara Hurst is an 56 y.o. female with history of schizoaffective disorder, hypertension who was brought by the EMS to the ER.  Per notes she has not been feeling well for the past couple days.  Fortunately she cannot give me a history in both the primary and to the ER note has not been finished.  Patient's home meds include Cogentin, Benadryl, lorazepam, Risperdal.  Currently she is sitting in bed eating her dinner.  Patient has a postural and resting fine motor tremor/shivering throughout.  When asked how she is feeling she has a hard time getting her words out but it is obvious that she is trying to say I do not feel well.  She has difficulty following commands.  She does not have any focal lateralizing deficit.  Patient does not show any significant rigidity.  While in the hospital patient's temperature max was 100.1 and has ranged between 98.7 and remained in the low grade 99.1-100 region.  She does have an elevated heart rate which is in the 130s and blood pressure has been between 588-502 systolically and 77-412 diastolically.  Of note patient is on Ativan 1-2 mg daily by mouth she takes 1 mg in the mouth in the morning and 2 mg by mouth at bedtime.  Patient states that she has not been taking her Ativan for the last 3-4 days.  Possibly more.  Past Medical History:  Diagnosis Date  . Hypertension   . Schizoaffective disorder ([place])     History reviewed. No pertinent surgical history.  Family History  Problem Relation Age of Onset  . Heart disease Mother   . Diabetic kidney disease Mother       Social  History:  reports that  has never smoked. she has never used smokeless tobacco. She reports that she does not drink alcohol or use drugs.  No Known Allergies  MEDICATIONS:  Prior to Admission:  Medications Prior to Admission  Medication Sig Dispense Refill Last Dose  . atorvastatin (LIPITOR) 20 MG tablet Take 20 mg daily by mouth.     . benztropine (COGENTIN) 0.5 MG tablet Take 1 tablet (0.5 mg total) by mouth 2 (two) times daily. 60 tablet 2 09/01/2014 at Unknown time  . buPROPion (WELLBUTRIN XL) 300 MG 24 hr tablet Take 300 mg daily by mouth.     . diphenhydrAMINE (BENADRYL) 25 mg capsule Take 1 capsule (25 mg total) by mouth 2 (two) times daily as needed for sleep (Extrapyramidal symptoms). 30 capsule 0   . hydrOXYzine (VISTARIL) 50 MG capsule Take 50 mg by mouth at bedtime.   09/01/2014 at Unknown time  . LORazepam (ATIVAN) 1 MG tablet Take 1-2 mg 2 (two) times daily by mouth. Take '1mg'$  by mouth in the morning and take '2mg'$  by mouth at bedtime   09/01/2014 at Unknown time  . losartan-hydrochlorothiazide (HYZAAR) 50-12.5 MG tablet Take 1 tablet daily by mouth.     . risperiDONE (RISPERDAL) 3 MG tablet Take 3 mg 2 (two) times daily by mouth.      Scheduled:    ROS:                                                                                                                                       History obtained from the patient  General ROS: negative for - chills, fatigue, fever, night sweats, weight gain or weight loss Psychological ROS: negative for - behavioral disorder, hallucinations, memory difficulties, mood swings or suicidal ideation Ophthalmic ROS: negative for - blurry vision, double vision, eye pain or loss of vision ENT ROS: negative for - epistaxis, nasal discharge, oral lesions, sore throat, tinnitus or vertigo Allergy and Immunology ROS: negative for - hives or  itchy/watery eyes Hematological and Lymphatic ROS: negative for - bleeding problems, bruising or swollen lymph nodes Endocrine ROS: negative for - galactorrhea, hair pattern changes, polydipsia/polyuria or temperature intolerance Respiratory ROS: negative for - cough, hemoptysis, shortness of breath or wheezing Cardiovascular ROS: negative for - chest pain, dyspnea on exertion, edema or irregular heartbeat Gastrointestinal ROS: negative for - abdominal pain, diarrhea, hematemesis, nausea/vomiting or stool incontinence Genito-Urinary ROS: negative for - dysuria, hematuria, incontinence or urinary frequency/urgency Musculoskeletal ROS: negative for - joint swelling or muscular weakness Neurological ROS: as noted in HPI Dermatological ROS: negative for rash and skin lesion changes   Blood pressure (!) 148/102, pulse (!) 119, temperature 99.1 F (37.3 C), temperature source Oral, resp. rate 16, SpO2 99 %.   Neurologic Examination:  HEENT-  Normocephalic, no lesions, without obvious abnormality.  Normal external eye and conjunctiva.  Normal TM's bilaterally.  Normal auditory canals and external ears. Normal external nose, mucus membranes and septum.  Normal pharynx. Cardiovascular- S1, S2 normal, pulses palpable throughout   Lungs- chest clear, no wheezing, rales, normal symmetric air entry Abdomen- normal findings: bowel sounds normal Extremities- no edema Lymph-no adenopathy palpable Musculoskeletal-no joint tenderness, deformity or swelling Skin-warm and dry, no hyperpigmentation, vitiligo, or suspicious lesions  Neurological Examination Mental Status: Alert, oriented oriented to hospital, appears to be very uncomfortable.  Speech slow but fluent without evidence of aphasia.  Able to follow 3 step commands without difficulty. Cranial Nerves: II:  Visual fields grossly normal,  III,IV, VI:  ptosis not present, extra-ocular motions intact bilaterally pupils equal, round, reactive to light and accommodation V,VII: smile symmetric, facial light touch sensation normal bilaterally VIII: hearing normal bilaterally IX,X: uvula rises symmetrically XI: bilateral shoulder shrug XII: midline tongue extension Motor: Able to move all extremities antigravity, she does have a resting shivering/high-frequency tremor throughout as if possible drawls.  No significant rigidity or cogwheel rigidity or leadpipe rigidity Sensory: Pinprick and light touch intact throughout, bilaterally Deep Tendon Reflexes: 1+ and symmetric throughout Plantars: Right: downgoing   Left: downgoing Cerebellar: normal finger-to-nose, unable to do heel to shin Gait: Not tested      Lab Results: Basic Metabolic Panel: Recent Labs  Lab 05/02/17 1918 05/03/17 0358 05/03/17 0709  NA 139 145 147*  K 3.4* 3.7 3.6  CL 101 108 113*  CO2 25  --  23  GLUCOSE 155* 108* 91  BUN '18 15 15  '$ CREATININE 1.54* 1.40* 1.25*  CALCIUM 11.0*  --  10.1  MG  --   --  2.6*    Liver Function Tests: Recent Labs  Lab 05/02/17 1918 05/03/17 0709  AST 23 19  ALT 17 16  ALKPHOS 107 92  BILITOT 0.9 0.5  PROT 8.8* 7.6  ALBUMIN 5.0 4.4   No results for input(s): LIPASE, AMYLASE in the last 168 hours. No results for input(s): AMMONIA in the last 168 hours.  CBC: Recent Labs  Lab 05/02/17 1918 05/03/17 0358 05/03/17 0709  WBC 13.3*  --  13.3*  NEUTROABS  --   --  9.7*  HGB 12.1 11.9* 10.9*  HCT 35.5* 35.0* 31.9*  MCV 81.1  --  80.8  PLT 279  --  233    Cardiac Enzymes: No results for input(s): CKTOTAL, CKMB, CKMBINDEX, TROPONINI in the last 168 hours.  Lipid Panel: No results for input(s): CHOL, TRIG, HDL, CHOLHDL, VLDL, LDLCALC in the last 168 hours.  CBG: Recent Labs  Lab 05/02/17 [place] 148*    Microbiology: Results for orders placed or performed during the hospital encounter of 05/02/17  MRSA  PCR Screening     Status: None   Collection Time: 05/03/17  6:40 AM  Result Value Ref Range Status   MRSA by PCR NEGATIVE NEGATIVE Final    Comment:        The GeneXpert MRSA Assay (FDA approved for NASAL specimens only), is one component of a comprehensive MRSA colonization surveillance program. It is not intended to diagnose MRSA infection nor to guide or monitor treatment for MRSA infections.     Coagulation Studies: No results for input(s): LABPROT, INR in the last 72 hours.  Imaging: Ct Head Wo Contrast  Result Date: 05/02/2017 CLINICAL DATA:  Confusion and difficulty speaking. EXAM: CT HEAD WITHOUT CONTRAST TECHNIQUE: Contiguous  axial images were obtained from the base of the skull through the vertex without intravenous contrast. COMPARISON:  None. FINDINGS: Brain: The brainstem, cerebellum, cerebral peduncles, thalami, basal ganglia, basilar cisterns, and ventricular system appear within normal limits. No intracranial hemorrhage, mass lesion, or acute CVA. Vascular: Unremarkable Skull: Unremarkable Sinuses/Orbits: Unremarkable Other: No supplemental non-categorized findings. IMPRESSION: 1. No significant intracranial abnormality to explain the patient's current symptoms. Electronically Signed   By: Van Clines M.D.   On: 05/02/2017 20:13   Dg Chest Portable 1 View  Result Date: 05/02/2017 CLINICAL DATA:  Altered mental status and fever. EXAM: PORTABLE CHEST 1 VIEW COMPARISON:  09/25/2013 chest radiograph FINDINGS: The cardiomediastinal silhouette is unremarkable. There is no evidence of focal airspace disease, pulmonary edema, suspicious pulmonary nodule/mass, pleural effusion, or pneumothorax. No acute bony abnormalities are identified. IMPRESSION: No evidence of active cardiopulmonary disease. Electronically Signed   By: Margarette Canada M.D.   On: 05/02/2017 20:31       Assessment and plan per attending neurologist  Etta Quill PA-C [facility] [phone]  05/03/2017, 3:17 PM   Assessment/Plan: This is a 56 year old female with significant psychiatric history who was brought to the hospital because she was not feeling well over the last few days.  She admits that she has not been taking her 3 mg of Ativan daily for at least last couple days.  Currently she is in bed and looks uncomfortable with a heart rate of 138.  On exam she has no significant rigidity and does not have a significant temperature.  CK is within normal limits.  Her alk phos is not elevated.  Patient's sodium is not significantly elevated and potassium is within normal limits.  At this time I believe the most likely cause of patient's symptoms would be withdrawal from Ativan.  However I will have Dr.Claudy Abdallah see patient  NEUROHOSPITALIST ADDENDUM Seen and examined the patient this evening. 24 y F with schizoaffective disorder admitted for not feeling well. Was found to be confused and tachycardic in ER. Has been OFF medications for several days. She received Ativan and haldol in ER. Patient had cogwheel rigidity this morning and neurology was consulted  for concern of NMS/Serotonin syndrome.   Assessment  Patient is tachycardic, hypertensive and has mild rigidity and tremors with no myoclonus, hyperreflexia. However she is currently afebrile (Tmax today 99.1)  Hypokalemic instead of hyperkalemic, has normal CK and oriented to place and person which goes against diagnosis of NMS. Also the patient is on Resperidone '3mg'$  BID for greater 2 years and states she has stopped her medications for 3 days including Ativan which makes benzo withdrawal more likely.   Her tachycardia has improved since this afternoon after getting Ativan '2mg'$  and I would recommend scheduled benzo every 4-6 hours. Also continue IV fluids. PRN antihypertensive and CIWA protocol. Rigidity and tremors may be due stopping Cogentin, however I would like her tachycardia to improve before  restarting it. While NMS cannot be completely, in the absence of hyperthermia and elevated CK I do not recommend starting her on Dantrolene at this time.   She wil stilll need close monitoring. If rigidity, mental status worsens or she becomes hyperthermic please call us- we will reassess the patient.     Karena Addison Amayah Staheli MD [facility] [phone]  If 7pm to 7am, please call on call as listed on AMION.

## 2017-05-04 DIAGNOSIS — R410 Disorientation, unspecified: Secondary | ICD-10-CM

## 2017-05-04 DIAGNOSIS — F13239 Sedative, hypnotic or anxiolytic dependence with withdrawal, unspecified: Principal | ICD-10-CM

## 2017-05-04 LAB — CBC
HEMATOCRIT: 28.7 % — AB (ref 36.0–46.0)
HEMOGLOBIN: 9.6 g/dL — AB (ref 12.0–15.0)
MCH: 27.4 pg (ref 26.0–34.0)
MCHC: 33.4 g/dL (ref 30.0–36.0)
MCV: 82 fL (ref 78.0–100.0)
Platelets: 210 10*3/uL (ref 150–400)
RBC: 3.5 MIL/uL — AB (ref 3.87–5.11)
RDW: 13.4 % (ref 11.5–15.5)
WBC: 12.8 10*3/uL — AB (ref 4.0–10.5)

## 2017-05-04 LAB — COMPREHENSIVE METABOLIC PANEL
ALBUMIN: 3.5 g/dL (ref 3.5–5.0)
ALK PHOS: 73 U/L (ref 38–126)
ALT: 12 U/L — AB (ref 14–54)
AST: 18 U/L (ref 15–41)
Anion gap: 5 (ref 5–15)
BILIRUBIN TOTAL: 0.4 mg/dL (ref 0.3–1.2)
BUN: 12 mg/dL (ref 6–20)
CALCIUM: 9.3 mg/dL (ref 8.9–10.3)
CO2: 24 mmol/L (ref 22–32)
Chloride: 120 mmol/L — ABNORMAL HIGH (ref 101–111)
Creatinine, Ser: 1.33 mg/dL — ABNORMAL HIGH (ref 0.44–1.00)
GFR calc Af Amer: 51 mL/min — ABNORMAL LOW (ref >60)
GFR calc non Af Amer: 44 mL/min — ABNORMAL LOW (ref >60)
GLUCOSE: 90 mg/dL (ref 65–99)
Potassium: 3.3 mmol/L — ABNORMAL LOW (ref 3.5–5.1)
Sodium: 149 mmol/L — ABNORMAL HIGH (ref 135–145)
Total Protein: 6.1 g/dL — ABNORMAL LOW (ref 6.5–8.1)

## 2017-05-04 LAB — URINE CULTURE: Culture: <10000 — AB

## 2017-05-04 LAB — IRON AND TIBC
IRON: 35 ug/dL (ref 28–170)
Saturation Ratios: 14 % (ref 10.4–31.8)
TIBC: 255 ug/dL (ref 250–450)
UIBC: 220 ug/dL

## 2017-05-04 LAB — FOLATE: FOLATE: 22.4 ng/mL (ref >5.9)

## 2017-05-04 LAB — VITAMIN B12: VITAMIN B 12: 1247 pg/mL — AB (ref 180–914)

## 2017-05-04 LAB — FERRITIN: FERRITIN: 210 ng/mL (ref 11–307)

## 2017-05-04 LAB — MAGNESIUM: Magnesium: 2.1 mg/dL (ref 1.7–2.4)

## 2017-05-04 LAB — PHOSPHORUS: Phosphorus: 2.4 mg/dL — ABNORMAL LOW (ref 2.5–4.6)

## 2017-05-04 MED ORDER — SODIUM CHLORIDE 0.9 % IV SOLN
INTRAVENOUS | Status: DC
  Administered 2017-05-05: 125 mL via INTRAVENOUS

## 2017-05-04 MED ORDER — POTASSIUM CHLORIDE CRYS ER 20 MEQ PO TBCR
40.0000 meq | EXTENDED_RELEASE_TABLET | Freq: Once | ORAL | Status: AC
  Administered 2017-05-04: 40 meq via ORAL
  Filled 2017-05-04: qty 2

## 2017-05-04 NOTE — Progress Notes (Signed)
PROGRESS NOTE    KARIELLE DAVIDOW  XQJ:[id number] DOB: 1961-01-01 DOA: 05/02/2017 PCP: Patient, No Pcp Per   Brief Narrative:  Genelda Roark Klumb is a 56 y.o. female with history of schizoaffective disorder, hypertension was brought to the ER by EMS after patient called EMS patient was not feeling well.  Patient states that over the last few days she has not taken her medications since she thought it would help her.  Denies any chest pain shortness of breath nausea vomiting diarrhea headache or visual symptoms.   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 Acute encephalopathy with tremors - Treating for benzodiazepine withdrawal.  Initially concern for NMS vs serotonin syndrome given sx.  Some cogwheeling on my exam, no clear clonus or hyperreflexia.  No true fevers, but temp 100.1 on 11/9.  She's also not on serotonergic agents at home (she denied OTC meds to me).  This morning she confirms that she hasn't had the ativan recently (as noted by neurology and previous notes). 1. Scheduled ativan, CIWA (most recently 4, as high as 15 overnight).  HR much better now.  2. Safety sitter 3. Appreciate neuro recs (rigidity and tremors may be related to d/c cogentin) 4. CK nl, Ammonia nl, TSH normal 5. Negative urine tox, etoh, salicylates, APAP.  Head CT unrevealing.  CXR without concerning findings.  2. Tachycardia: suspect due to above.  Improved.  Continue IVF, telemetry.   3. Leukocytosis: no si/sx of infection, ctm.   4. History of hypertension per the chart -I have placed patient on as needed hydralazine for now.  Closely follow blood pressure trends.  5. History of schizoaffective disorder -will need psychiatric input before restarting her home medications.  Will hold off for now  6. Acute renal failure possible dehydration.   Creatinine appears to be close to baseline currently.  7. Urinary retention: pt with >999 on bladder.  Foley placed.  UA and cx ordered.  Ctm.    8. Anemia: iron panel, ferritin, b12, folate.   DVT prophylaxis: SCD Code Status: full  Family Communication: none at bedside Disposition Plan: pending improvemment  Consultants:   neurology  Procedures: (Don't include imaging studies which can be auto populated. Include things that cannot be auto populated i.e. Echo, Carotid and venous dopplers, Foley, Bipap, HD, tubes/drains, wound vac, central lines etc)  none  Antimicrobials: (specify start and planned stop date. Auto populated tables are space occupying and do not give end dates)  none    Subjective: Difficult as pt with word finding difficulties. Doesn't feel better. Worried about her purse. Worried about friend that was supposed to pick her up today.  No CP, SOB.   Objective: Vitals:   05/04/17 0100 05/04/17 0200 05/04/17 0300 05/04/17 0400  BP: 130/83 122/75 111/74   Pulse: 96 90 88 (!) 104  Resp: (!) 23 (!) 21 (!) 21 (!) 24  Temp:    98.2 F (36.8 C)  TempSrc:    Oral  SpO2: 98% 97% 98% 100%    Intake/Output Summary (Last 24 hours) at 05/04/2017 0758 Last data filed at 05/04/2017 0600 Gross per 24 hour  Intake 1956.67 ml  Output 4625 ml  Net -2668.33 ml   There were no vitals filed for this visit.  Examination:  General: Tremulous, but improved from yesterday Cardiovascular: Heart sounds show  a regular rate, and rhythm.  Tachy, improved. No gallops or rubs. No murmurs. No JVD. Lungs: Clear to auscultation bilaterally with good air movement. No rales, rhonchi or wheezes. Abdomen: Soft, nontender, nondistended with normal active bowel sounds. No masses. No hepatosplenomegaly. Neurological: Alert. Moves all extremities 4 with equal strength. Cranial nerves II through XII grossly intact.  Some mild cogwheeling to upper extremities, none lower.  No clonus or  hyperreflexia. Skin: Warm and dry. No rashes or lesions. Extremities: No clubbing or cyanosis. No edema. Pedal pulses 2+. Psychiatric: Insight and judgment are impaired.  Data Reviewed: I have personally reviewed following labs and imaging studies  CBC: Recent Labs  Lab 05/02/17 1918 05/03/17 0358 05/03/17 0709 05/04/17 0346  WBC 13.3*  --  13.3* 12.8*  NEUTROABS  --   --  9.7*  --   HGB 12.1 11.9* 10.9* 9.6*  HCT 35.5* 35.0* 31.9* 28.7*  MCV 81.1  --  80.8 82.0  PLT 279  --  233 350   Basic Metabolic Panel: Recent Labs  Lab 05/02/17 1918 05/03/17 0358 05/03/17 0709 05/04/17 0346  NA 139 145 147* 149*  K 3.4* 3.7 3.6 3.3*  CL 101 108 113* 120*  CO2 25  --  23 24  GLUCOSE 155* 108* 91 90  BUN 18 15 15 12   CREATININE 1.54* 1.40* 1.25* 1.33*  CALCIUM 11.0*  --  10.1 9.3  MG  --   --  2.6*  --   PHOS  --   --   --  2.4*   GFR: CrCl cannot be calculated (Unknown ideal weight.). Liver Function Tests: Recent Labs  Lab 05/02/17 1918 05/03/17 0709 05/04/17 0346  AST 23 19 18   ALT 17 16 12*  ALKPHOS 107 92 73  BILITOT 0.9 0.5 0.4  PROT 8.8* 7.6 6.1*  ALBUMIN 5.0 4.4 3.5   No results for input(s): LIPASE, AMYLASE in the last 168 hours. Recent Labs  Lab 05/03/17 1449  AMMONIA 24   Coagulation Profile: No results for input(s): INR, PROTIME in the last 168 hours. Cardiac Enzymes: Recent Labs  Lab 05/03/17 0709  CKTOTAL 207   BNP (last 3 results) No results for input(s): PROBNP in the last 8760 hours. HbA1C: No results for input(s): HGBA1C in the last 72 hours. CBG: Recent Labs  Lab 05/02/17 1923  GLUCAP 148*   Lipid Profile: No results for input(s): CHOL, HDL, LDLCALC, TRIG, CHOLHDL, LDLDIRECT in the last 72 hours. Thyroid Function Tests: Recent Labs    05/03/17 0709  TSH 0.356   Anemia Panel: No results for input(s): VITAMINB12, FOLATE, FERRITIN, TIBC, IRON, RETICCTPCT in the last 72 hours. Sepsis Labs: Recent Labs  Lab 05/02/17 1936    LATICACIDVEN 1.84    Recent Results (from the past 240 hour(s))  MRSA PCR Screening     Status: None   Collection Time: 05/03/17  6:40 AM  Result Value Ref Range Status   MRSA by PCR NEGATIVE NEGATIVE Final    Comment:        The GeneXpert MRSA Assay (FDA approved for NASAL specimens only), is one component of a comprehensive MRSA colonization surveillance program. It is not intended to diagnose MRSA infection nor to guide or monitor treatment for MRSA infections.          Radiology Studies: Ct Head Wo Contrast  Result Date: 05/02/2017 CLINICAL DATA:  Confusion and difficulty speaking. EXAM: CT HEAD WITHOUT CONTRAST TECHNIQUE: Contiguous axial images were obtained from the base of the skull  through the vertex without intravenous contrast. COMPARISON:  None. FINDINGS: Brain: The brainstem, cerebellum, cerebral peduncles, thalami, basal ganglia, basilar cisterns, and ventricular system appear within normal limits. No intracranial hemorrhage, mass lesion, or acute CVA. Vascular: Unremarkable Skull: Unremarkable Sinuses/Orbits: Unremarkable Other: No supplemental non-categorized findings. IMPRESSION: 1. No significant intracranial abnormality to explain the patient's current symptoms. Electronically Signed   By: Van Clines M.D.   On: 05/02/2017 20:13   Dg Chest Portable 1 View  Result Date: 05/02/2017 CLINICAL DATA:  Altered mental status and fever. EXAM: PORTABLE CHEST 1 VIEW COMPARISON:  09/25/2013 chest radiograph FINDINGS: The cardiomediastinal silhouette is unremarkable. There is no evidence of focal airspace disease, pulmonary edema, suspicious pulmonary nodule/mass, pleural effusion, or pneumothorax. No acute bony abnormalities are identified. IMPRESSION: No evidence of active cardiopulmonary disease. Electronically Signed   By: Margarette Canada M.D.   On: 05/02/2017 20:31        Scheduled Meds: . folic acid  1 mg Intravenous Daily  . LORazepam  2 mg Intravenous Q6H    Continuous Infusions:    LOS: 1 day    Time spent: over 20 minutes    Fayrene Helper, MD [facility] Pager [phone]   If 7PM-7AM, please contact night-coverage [email or web address] Password [email or web address] 05/04/2017, 7:58 AM

## 2017-05-04 NOTE — Progress Notes (Signed)
Pt increasingly agitated, pulling at tubes.  Says she wants to go home.  Trying to get out of bed.  Dr. paged

## 2017-05-04 NOTE — Progress Notes (Signed)
Reason for consult:   Subjective: Patient appears more calm. Having lunch. No longer tachycardic.    ROS: negative except above   Examination  Vital signs in last 24 hours: Temp:  [97.8 F (36.6 C)-98.6 F (37 C)] 98.5 F (36.9 C) (11/09 1944) Pulse Rate:  [68-124] 89 (11/09 2002) Resp:  [18-38] 26 (11/09 2002) BP: (111-172)/(74-112) 158/95 (11/09 2002) SpO2:  [86 %-100 %] 100 % (11/09 2002)  General: Not in distress, cooperative CVS: pulse-normal rate and rhythm RS: breathing comfortably Extremities: normal   Neuro: MS: Alert, oriented, follows commands CN: pupils equal and reactive,  EOMI, face symmetric, tongue midline, normal sensation over face, Motor: 5/5 strength in all 4 extremities, tone : mild rigidity Reflexes: 2+ bilaterally over patella, biceps, plantars: flexor Coordination: normal Gait: not tested  Basic Metabolic Panel: Recent Labs  Lab 05/02/17 1918 05/03/17 0358 05/03/17 0709 05/04/17 0346  NA 139 145 147* 149*  K 3.4* 3.7 3.6 3.3*  CL 101 108 113* 120*  CO2 25  --  23 24  GLUCOSE 155* 108* 91 90  BUN 18 15 15 12   CREATININE 1.54* 1.40* 1.25* 1.33*  CALCIUM 11.0*  --  10.1 9.3  MG  --   --  2.6* 2.1  PHOS  --   --   --  2.4*    CBC: Recent Labs  Lab 05/02/17 1918 05/03/17 0358 05/03/17 0709 05/04/17 0346  WBC 13.3*  --  13.3* 12.8*  NEUTROABS  --   --  9.7*  --   HGB 12.1 11.9* 10.9* 9.6*  HCT 35.5* 35.0* 31.9* 28.7*  MCV 81.1  --  80.8 82.0  PLT 279  --  233 210     Coagulation Studies: No results for input(s): LABPROT, INR in the last 72 hours.  Imaging Reviewed:     ASSESSMENT AND PLAN  Benzo withdrawal Continue scheduled Ativan Consult Psych on timing to resume psychiatric medications   Tara Hurst [facility] Pager Number [phone] For questions after 7pm please refer to AMION to reach the Neurologist on call

## 2017-05-05 ENCOUNTER — Inpatient Hospital Stay (HOSPITAL_COMMUNITY): Payer: Medicaid Other

## 2017-05-05 DIAGNOSIS — F259 Schizoaffective disorder, unspecified: Secondary | ICD-10-CM

## 2017-05-05 DIAGNOSIS — R413 Other amnesia: Secondary | ICD-10-CM

## 2017-05-05 DIAGNOSIS — I10 Essential (primary) hypertension: Secondary | ICD-10-CM

## 2017-05-05 LAB — CBC
HCT: 31.1 % — ABNORMAL LOW (ref 36.0–46.0)
Hemoglobin: 10 g/dL — ABNORMAL LOW (ref 12.0–15.0)
MCH: 26.8 pg (ref 26.0–34.0)
MCHC: 32.2 g/dL (ref 30.0–36.0)
MCV: 83.4 fL (ref 78.0–100.0)
PLATELETS: 212 10*3/uL (ref 150–400)
RBC: 3.73 MIL/uL — ABNORMAL LOW (ref 3.87–5.11)
RDW: 13.6 % (ref 11.5–15.5)
WBC: 15.4 10*3/uL — ABNORMAL HIGH (ref 4.0–10.5)

## 2017-05-05 LAB — BASIC METABOLIC PANEL
ANION GAP: 7 (ref 5–15)
ANION GAP: 7 (ref 5–15)
Anion gap: 8 (ref 5–15)
BUN: 14 mg/dL (ref 6–20)
BUN: 11 mg/dL (ref 6–20)
BUN: 10 mg/dL (ref 6–20)
CALCIUM: 10.2 mg/dL (ref 8.9–10.3)
CALCIUM: 10.3 mg/dL (ref 8.9–10.3)
CALCIUM: 10.2 mg/dL (ref 8.9–10.3)
CO2: 23 mmol/L (ref 22–32)
CO2: 25 mmol/L (ref 22–32)
CO2: 27 mmol/L (ref 22–32)
CREATININE: 1.23 mg/dL — AB (ref 0.44–1.00)
CREATININE: 1.22 mg/dL — AB (ref 0.44–1.00)
Chloride: 127 mmol/L — ABNORMAL HIGH (ref 101–111)
Chloride: 121 mmol/L — ABNORMAL HIGH (ref 101–111)
Chloride: 118 mmol/L — ABNORMAL HIGH (ref 101–111)
Creatinine, Ser: 1.29 mg/dL — ABNORMAL HIGH (ref 0.44–1.00)
GFR calc Af Amer: 56 mL/min — ABNORMAL LOW (ref >60)
GFR calc Af Amer: 56 mL/min — ABNORMAL LOW (ref >60)
GFR calc Af Amer: 53 mL/min — ABNORMAL LOW (ref >60)
GFR calc non Af Amer: 49 mL/min — ABNORMAL LOW (ref >60)
GFR calc non Af Amer: 45 mL/min — ABNORMAL LOW (ref >60)
GFR, EST NON AFRICAN AMERICAN: 48 mL/min — AB (ref >60)
GLUCOSE: 104 mg/dL — AB (ref 65–99)
GLUCOSE: 130 mg/dL — AB (ref 65–99)
Glucose, Bld: 82 mg/dL (ref 65–99)
Potassium: 4.7 mmol/L (ref 3.5–5.1)
Potassium: 4 mmol/L (ref 3.5–5.1)
Potassium: 4 mmol/L (ref 3.5–5.1)
SODIUM: 158 mmol/L — AB (ref 135–145)
Sodium: 153 mmol/L — ABNORMAL HIGH (ref 135–145)
Sodium: 152 mmol/L — ABNORMAL HIGH (ref 135–145)

## 2017-05-05 LAB — OSMOLALITY: Osmolality: 328 mOsm/kg (ref 275–295)

## 2017-05-05 LAB — OSMOLALITY, URINE: OSMOLALITY UR: 250 mosm/kg — AB (ref 300–900)

## 2017-05-05 LAB — SODIUM, URINE, RANDOM: SODIUM UR: 77 mmol/L

## 2017-05-05 MED ORDER — LORAZEPAM 1 MG PO TABS
1.0000 mg | ORAL_TABLET | Freq: Every morning | ORAL | Status: DC
  Administered 2017-05-05 – 2017-05-10 (×6): 1 mg via ORAL
  Filled 2017-05-05 (×6): qty 1

## 2017-05-05 MED ORDER — SODIUM CHLORIDE 0.45 % IV SOLN: INTRAVENOUS | Status: DC

## 2017-05-05 MED ORDER — RISPERIDONE 1 MG PO TABS
1.0000 mg | ORAL_TABLET | Freq: Every day | ORAL | Status: AC
  Administered 2017-05-05: 1 mg via ORAL
  Filled 2017-05-05: qty 1

## 2017-05-05 MED ORDER — RISPERIDONE 1 MG PO TABS
2.0000 mg | ORAL_TABLET | Freq: Two times a day (BID) | ORAL | Status: DC
  Administered 2017-05-07 – 2017-05-10 (×7): 2 mg via ORAL
  Filled 2017-05-05 (×7): qty 2

## 2017-05-05 MED ORDER — RISPERIDONE 1 MG PO TABS
1.0000 mg | ORAL_TABLET | Freq: Two times a day (BID) | ORAL | Status: AC
  Administered 2017-05-06 (×2): 1 mg via ORAL
  Filled 2017-05-05 (×2): qty 1

## 2017-05-05 MED ORDER — THIAMINE HCL 100 MG/ML IJ SOLN: 100.0000 mg | Freq: Every day | INTRAMUSCULAR | Status: DC

## 2017-05-05 MED ORDER — BUPROPION HCL ER (XL) 150 MG PO TB24
150.0000 mg | ORAL_TABLET | Freq: Every day | ORAL | Status: AC
  Administered 2017-05-06 – 2017-05-08 (×3): 150 mg via ORAL
  Filled 2017-05-05 (×3): qty 1

## 2017-05-05 MED ORDER — LORAZEPAM 2 MG/ML IJ SOLN: 1.0000 mg | Freq: Four times a day (QID) | INTRAMUSCULAR | Status: AC | PRN

## 2017-05-05 MED ORDER — ENOXAPARIN SODIUM 40 MG/0.4ML ~~LOC~~ SOLN
40.0000 mg | SUBCUTANEOUS | Status: DC
  Administered 2017-05-05 – 2017-05-09 (×5): 40 mg via SUBCUTANEOUS
  Filled 2017-05-05 (×5): qty 0.4

## 2017-05-05 MED ORDER — LORAZEPAM 1 MG PO TABS
1.0000 mg | ORAL_TABLET | Freq: Four times a day (QID) | ORAL | Status: AC | PRN
Start: 2017-05-05 — End: 2017-05-08

## 2017-05-05 MED ORDER — ADULT MULTIVITAMIN W/MINERALS CH
1.0000 | ORAL_TABLET | Freq: Every day | ORAL | Status: DC
  Administered 2017-05-06 – 2017-05-10 (×5): 1 via ORAL
  Filled 2017-05-05 (×5): qty 1

## 2017-05-05 MED ORDER — DEXTROSE 5 % IV SOLN
INTRAVENOUS | Status: DC
  Administered 2017-05-05: 150 mL via INTRAVENOUS
  Administered 2017-05-05: 15:00:00 via INTRAVENOUS

## 2017-05-05 MED ORDER — BUPROPION HCL ER (XL) 300 MG PO TB24
300.0000 mg | ORAL_TABLET | Freq: Every day | ORAL | Status: DC
  Administered 2017-05-09 – 2017-05-10 (×2): 300 mg via ORAL
  Filled 2017-05-05 (×2): qty 1

## 2017-05-05 MED ORDER — VITAMIN B-1 100 MG PO TABS
100.0000 mg | ORAL_TABLET | Freq: Every day | ORAL | Status: DC
  Administered 2017-05-06 – 2017-05-10 (×5): 100 mg via ORAL
  Filled 2017-05-05 (×5): qty 1

## 2017-05-05 MED ORDER — LORAZEPAM 1 MG PO TABS
2.0000 mg | ORAL_TABLET | Freq: Every day | ORAL | Status: DC
  Administered 2017-05-05 – 2017-05-09 (×5): 2 mg via ORAL
  Filled 2017-05-05 (×5): qty 2

## 2017-05-05 MED ORDER — BENZTROPINE MESYLATE 0.5 MG PO TABS
0.5000 mg | ORAL_TABLET | Freq: Two times a day (BID) | ORAL | Status: DC
  Administered 2017-05-06 – 2017-05-10 (×9): 0.5 mg via ORAL
  Filled 2017-05-05 (×11): qty 1

## 2017-05-05 NOTE — Progress Notes (Signed)
Serum Osmolality 328 called to Dr Florene Glen.

## 2017-05-05 NOTE — Progress Notes (Signed)
PROGRESS NOTE    Tara Hurst  GEX:[id number] DOB: 1960-09-17 DOA: 05/02/2017 PCP: Patient, No Pcp Per   Brief Narrative:  Tara Hurst is Tara Hurst 56 y.o. female with history of schizoaffective disorder, hypertension was brought to the ER by EMS after patient called EMS patient was not feeling well.  Patient states that over the last few days she has not taken her medications since she thought it would help her.  Denies any chest pain shortness of breath nausea vomiting diarrhea headache or visual symptoms.   Assessment & Plan:   Principal Problem:   Acute encephalopathy Active Problems:   Schizoaffective disorder ([place])   Hypertension   ARF (acute renal failure) (HCC)   Benzodiazepine withdrawal ([place])   1. Acute encephalopathy with tremors - Treating for benzodiazepine withdrawal.  Initially concern for NMS vs serotonin syndrome given sx. She's improved with scheduled ativan and CIWA 1. Resume home ativan, CIWA (Mostly less than 8 over past 24 hours).   2. Safety sitter 3. Appreciate neuro recs (rigidity and tremors may be related to d/c cogentin) 4. CK nl, Ammonia nl, TSH normal 5. Negative urine tox, etoh, salicylates, APAP.  Head CT unrevealing.  CXR without concerning findings.  2. Hypernatremia:  Appears euvolemic.  Suspect multifactorial.  Likely related to NS administration, but patient also has had 4 L of UOP over past 24 hours.  With hx of lithium use, has hx of possible diabetes insipidus in chart (could also consider postobstructive diuresis with her retaining urine on admission over 900 cc retained).   1. 4.5 L Free water deficit 2. Will start D5W to correct.  Discussed with nursing to have free water available at bedside. 3. Check urine sodium, urine osm, serum osm.  Will check renal US.   3. Tachycardia: suspect due to above.  Improved.  Continue IVF, telemetry.   4. Leukocytosis: Worsened today, but afebrile.  Will continue to monitor for now.   5. History of  hypertension per the chart.  CTM.   6. History of schizoaffective disorder -will need psychiatric input before restarting her home medications.  Will hold off for now  7. Acute renal failure possible dehydration.  Creatinine appears to be close to baseline currently.  8. Urinary retention: pt with >999 on bladder.  Foley placed.  Ucx with insignificant growth.  Ctm.  Possible trial without foley.  9. Anemia: iron panel, ferritin, b12, folate wnl.   DVT prophylaxis: SCD Code Status: full  Family Communication: none at bedside Disposition Plan: pending improvemment  Consultants:   neurology  Procedures: (Don't include imaging studies which can be auto populated. Include things that cannot be auto populated i.e. Echo, Carotid and venous dopplers, Foley, Bipap, HD, tubes/drains, wound vac, central lines etc)  none  Antimicrobials: (specify start and planned stop date. Auto populated tables are space occupying and do not give end dates)  none    Subjective: Doing ok this morning. Tara Hurst&O x 1-2 (says Tara Hurst). No other complaints.  Is thirsty.  Objective: Vitals:   05/05/17 0345 05/05/17 0400 05/05/17 0600 05/05/17 0800  BP:  128/79 121/64   Pulse:  78 74   Resp:      Temp: 98 F (36.7 C)   97.7 F (36.5 C)  TempSrc: Oral   Axillary  SpO2:  100% 100%     Intake/Output Summary (Last 24 hours) at 05/05/2017 0805 Last data filed at 05/05/2017 0600 Gross per 24 hour  Intake 4075 ml  Output 4100 ml  Net -25 ml   There were no vitals filed for this visit.  Examination:  General: No acute distress.  Mildly tremulous Cardiovascular: Heart sounds show Tara Hurst regular rate, and rhythm. No gallops or rubs. No murmurs. No JVD. Lungs: Clear to auscultation bilaterally with good air movement. No rales, rhonchi or wheezes. Abdomen: Soft, nontender, nondistended with normal active bowel sounds. No masses. No hepatosplenomegaly. Neurological: Alert and oriented 1-2. Moves all  extremities 4 with equal strength. Cranial nerves II through XII grossly intact. Skin: Warm and dry. No rashes or lesions. Extremities: No clubbing or cyanosis. No edema. Pedal pulses 2+..  Data Reviewed: I have personally reviewed following labs and imaging studies  CBC: Recent Labs  Lab 05/02/17 1918 05/03/17 0358 05/03/17 0709 05/04/17 0346 05/05/17 0319  WBC 13.3*  --  13.3* 12.8* 15.4*  NEUTROABS  --   --  9.7*  --   --   HGB 12.1 11.9* 10.9* 9.6* 10.0*  HCT 35.5* 35.0* 31.9* 28.7* 31.1*  MCV 81.1  --  80.8 82.0 83.4  PLT 279  --  233 210 440   Basic Metabolic Panel: Recent Labs  Lab 05/02/17 1918 05/03/17 0358 05/03/17 0709 05/04/17 0346 05/05/17 0319  NA 139 145 147* 149* 158*  K 3.4* 3.7 3.6 3.3* 4.7  CL 101 108 113* 120* 127*  CO2 25  --  23 24 23   GLUCOSE 155* 108* 91 90 82  BUN 18 15 15 12 14   CREATININE 1.54* 1.40* 1.25* 1.33* 1.23*  CALCIUM 11.0*  --  10.1 9.3 10.2  MG  --   --  2.6* 2.1  --   PHOS  --   --   --  2.4*  --    GFR: CrCl cannot be calculated (Unknown ideal weight.). Liver Function Tests: Recent Labs  Lab 05/02/17 1918 05/03/17 0709 05/04/17 0346  AST 23 19 18   ALT 17 16 12*  ALKPHOS 107 92 73  BILITOT 0.9 0.5 0.4  PROT 8.8* 7.6 6.1*  ALBUMIN 5.0 4.4 3.5   No results for input(s): LIPASE, AMYLASE in the last 168 hours. Recent Labs  Lab 05/03/17 1449  AMMONIA 24   Coagulation Profile: No results for input(s): INR, PROTIME in the last 168 hours. Cardiac Enzymes: Recent Labs  Lab 05/03/17 0709  CKTOTAL 207   BNP (last 3 results) No results for input(s): PROBNP in the last 8760 hours. HbA1C: No results for input(s): HGBA1C in the last 72 hours. CBG: Recent Labs  Lab 05/02/17 1923  GLUCAP 148*   Lipid Profile: No results for input(s): CHOL, HDL, LDLCALC, TRIG, CHOLHDL, LDLDIRECT in the last 72 hours. Thyroid Function Tests: Recent Labs    05/03/17 0709  TSH 0.356   Anemia Panel: Recent Labs     05/04/17 1039  VITAMINB12 1,247*  FOLATE 22.4  FERRITIN 210  TIBC 255  IRON 35   Sepsis Labs: Recent Labs  Lab 05/02/17 1936  LATICACIDVEN 1.84    Recent Results (from the past 240 hour(s))  MRSA PCR Screening     Status: None   Collection Time: 05/03/17  6:40 AM  Result Value Ref Range Status   MRSA by PCR NEGATIVE NEGATIVE Final    Comment:        The GeneXpert MRSA Assay (FDA approved for NASAL specimens only), is one component of Clarece Drzewiecki comprehensive MRSA colonization surveillance program. It is not intended to diagnose MRSA infection nor to guide or monitor treatment for MRSA infections.   Culture, Urine  Status: Abnormal   Collection Time: 05/03/17  3:00 PM  Result Value Ref Range Status   Specimen Description URINE, CATHETERIZED  Final   Special Requests NONE  Final   Culture (Jasleen Riepe)  Final    <10,000 COLONIES/mL INSIGNIFICANT GROWTH Performed at [facility], [address], [place], [place] [address]    Report Status 05/04/2017 FINAL  Final         Radiology Studies: No results found.      Scheduled Meds: . folic acid  1 mg Intravenous Daily  . LORazepam  2 mg Intravenous Q6H   Continuous Infusions: . sodium chloride       LOS: 2 days    Time spent: over 20 minutes    Fayrene Helper, MD [facility] Pager [phone]   If 7PM-7AM, please contact night-coverage [email or web address] Password [email or web address] 05/05/2017, 8:05 AM

## 2017-05-05 NOTE — Consult Note (Signed)
[facility] Face-to-Face Psychiatry Consult   Reason for Consult:  Medication management Referring Physician:  DR. Fayrene Helper Patient Identification: Tara Hurst MRN:  [id number] Principal Diagnosis: Acute encephalopathy Diagnosis:   Patient Active Problem List   Diagnosis Date Noted  . Acute encephalopathy [G93.40] 05/03/2017  . ARF (acute renal failure) ([place]) [N17.9] 05/03/2017  . Benzodiazepine withdrawal ([place]) [F13.239] 05/03/2017  . HCAP (healthcare-associated pneumonia) [J18.9] 09/25/2013  . Schizoaffective disorder ([place]) [F25.9] 09/25/2013  . Hypertension [I10] 09/25/2013  . Abnormal lithium level in blood [R78.89] 09/25/2013    Total Time spent with patient: 20 minutes  Subjective:   Tara Hurst is a 56 year old female with schizoaffective disorder, hypertension, who was brought by EMS after patient called EMS not feeling well. She has been treated for acute encephalopathy with tremors, most likely secondary to benzodiazepine withdrawal and hypernatremia, likely secondary to diabetes insipidus.   HPI:   She reports that she was admitted here, feeling confused when she came to the hospital. She feels much better now. She states that she has been taking medication regularly (although the chart states she was not taking for the three days) and denies any concern of restarting these medication. She has not seen psychiatrist for a while. She reports occasional memory loss, although she has been able to manage ADL/IADL and she lives at her own place by herself. Upon finishing up the interview, she asks the interviewer that "why am I here?" (On another occasion, "Tara Guadeloupe.." when she is asked about her previous psychiatrist.)  She slept well last night, although she had insomnia in the past. She feels sad at times, and reports her friend at the bedside is very helpful. She denies SI, HI, AH/VH. She denies decreased need for sleep or euphoria. She denies ideas of reference or paranoia.    Her friend, Ms. Tara Hurst is at the bedside.  She meets with patient occasionally. Although the patient was very confused yesterday, she appears to be doing much better. Tara Hurst thinks the patient is almost back to baseline. Tara Hurst denies any safety concern.   Per PMP,  Patient is on ativan 1 mg TID, filled on 03/27/2017 for 90 days  I have utilized the [place] Controlled Substances Reporting System (PMP AWARxE) to confirm adherence regarding the patient's medication. My review reveals appropriate prescription fills.   Past Psychiatric History:  Used to see a psychiatrist for a few years at [facility]; has not seen any since this psychiatrist is retired (details unknown). She sees a therapist occasionally.  Psych admission: in [place] attempt: denies   Risk to Self: Is patient at risk for suicide?: No Risk to Others:  No Prior Inpatient Therapy:   Prior Outpatient Therapy:    Past Medical History:  Past Medical History:  Diagnosis Date  . Hypertension   . Schizoaffective disorder ([place])    History reviewed. No pertinent surgical history. Family History:  Family History  Problem Relation Age of Onset  . Heart disease Mother   . Diabetic kidney disease Mother    Family Psychiatric  History: denies Social History:  Social History   Substance and Sexual Activity  Alcohol Use No     Social History   Substance and Sexual Activity  Drug Use No    Social History   Socioeconomic History  . Marital status: Single    Spouse name: None  . Number of children: None  . Years of education: None  . Highest education level: None  Social Needs  . Financial resource strain: None  . Food insecurity - worry: None  . Food insecurity - inability: None  . Transportation needs - medical: None  . Transportation needs - non-medical: None  Occupational History  . None  Tobacco Use  . Smoking status: Never Smoker  . Smokeless tobacco: Never Used  Substance and Sexual Activity  .  Alcohol use: No  . Drug use: No  . Sexual activity: No  Other Topics Concern  . None  Social History Narrative  . None   Additional Social History:   She lives by herself, never married, no children Allergies:  No Known Allergies  Labs:  Results for orders placed or performed during the hospital encounter of 05/02/17 (from the past 48 hour(s))  Ammonia     Status: None   Collection Time: 05/03/17  2:49 PM  Result Value Ref Range   Ammonia 24 9 - 35 umol/L  Acetaminophen level     Status: Abnormal   Collection Time: 05/03/17  2:49 PM  Result Value Ref Range   Acetaminophen (Tylenol), Serum <10 (L) 10 - 30 ug/mL    Comment:        THERAPEUTIC CONCENTRATIONS VARY SIGNIFICANTLY. A RANGE OF 10-30 ug/mL MAY BE AN EFFECTIVE CONCENTRATION FOR MANY PATIENTS. HOWEVER, SOME ARE BEST TREATED AT CONCENTRATIONS OUTSIDE THIS RANGE. ACETAMINOPHEN CONCENTRATIONS >150 ug/mL AT 4 HOURS AFTER INGESTION AND >50 ug/mL AT 12 HOURS AFTER INGESTION ARE OFTEN ASSOCIATED WITH TOXIC REACTIONS.   Urinalysis, Routine w reflex microscopic     Status: Abnormal   Collection Time: 05/03/17  3:00 PM  Result Value Ref Range   Color, Urine STRAW (A) YELLOW   APPearance CLEAR CLEAR   Specific Gravity, Urine 1.005 1.005 - 1.030   pH 5.0 5.0 - 8.0   Glucose, UA NEGATIVE NEGATIVE mg/dL   Hgb urine dipstick NEGATIVE NEGATIVE   Bilirubin Urine NEGATIVE NEGATIVE   Ketones, ur NEGATIVE NEGATIVE mg/dL   Protein, ur NEGATIVE NEGATIVE mg/dL   Nitrite NEGATIVE NEGATIVE   Leukocytes, UA MODERATE (A) NEGATIVE   RBC / HPF 0-5 0 - 5 RBC/hpf   WBC, UA 6-30 0 - 5 WBC/hpf   Bacteria, UA RARE (A) NONE SEEN   Squamous Epithelial / LPF 0-5 (A) NONE SEEN   Mucus PRESENT   Culture, Urine     Status: Abnormal   Collection Time: 05/03/17  3:00 PM  Result Value Ref Range   Specimen Description URINE, CATHETERIZED    Special Requests NONE    Culture (A)     <10,000 COLONIES/mL INSIGNIFICANT GROWTH Performed at [facility], [address], [place], [place] [address]    Report Status 05/04/2017 FINAL   CBC     Status: Abnormal   Collection Time: 05/04/17  3:46 AM  Result Value Ref Range   WBC 12.8 (H) 4.0 - 10.5 K/uL   RBC 3.50 (L) 3.87 - 5.11 MIL/uL   Hemoglobin 9.6 (L) 12.0 - 15.0 g/dL   HCT 28.7 (L) 36.0 - 46.0 %   MCV 82.0 78.0 - 100.0 fL   MCH 27.4 26.0 - 34.0 pg   MCHC 33.4 30.0 - 36.0 g/dL   RDW 13.4 11.5 - 15.5 %   Platelets 210 150 - 400 K/uL  Comprehensive metabolic panel     Status: Abnormal   Collection Time: 05/04/17  3:46 AM  Result Value Ref Range   Sodium 149 (H) 135 - 145 mmol/L   Potassium 3.3 (L)  3.5 - 5.1 mmol/L   Chloride 120 (H) 101 - 111 mmol/L   CO2 24 22 - 32 mmol/L   Glucose, Bld 90 65 - 99 mg/dL   BUN 12 6 - 20 mg/dL   Creatinine, Ser 1.33 (H) 0.44 - 1.00 mg/dL   Calcium 9.3 8.9 - 10.3 mg/dL   Total Protein 6.1 (L) 6.5 - 8.1 g/dL   Albumin 3.5 3.5 - 5.0 g/dL   AST 18 15 - 41 U/L   ALT 12 (L) 14 - 54 U/L   Alkaline Phosphatase 73 38 - 126 U/L   Total Bilirubin 0.4 0.3 - 1.2 mg/dL   GFR calc non Af Amer 44 (L) >60 mL/min   GFR calc Af Amer 51 (L) >60 mL/min    Comment: (NOTE) The eGFR has been calculated using the CKD EPI equation. This calculation has not been validated in all clinical situations. eGFR's persistently <60 mL/min signify possible Chronic Kidney Disease.    Anion gap 5 5 - 15  Phosphorus     Status: Abnormal   Collection Time: 05/04/17  3:46 AM  Result Value Ref Range   Phosphorus 2.4 (L) 2.5 - 4.6 mg/dL  Magnesium     Status: None   Collection Time: 05/04/17  3:46 AM  Result Value Ref Range   Magnesium 2.1 1.7 - 2.4 mg/dL  Iron and TIBC     Status: None   Collection Time: 05/04/17 10:39 AM  Result Value Ref Range   Iron 35 28 - 170 ug/dL   TIBC 255 250 - 450 ug/dL   Saturation Ratios 14 10.4 - 31.8 %   UIBC 220 ug/dL    Comment: Performed at [facility], [place] [address] [place], [place] [address]  Ferritin     Status:  None   Collection Time: 05/04/17 10:39 AM  Result Value Ref Range   Ferritin 210 11 - 307 ng/mL    Comment: Performed at [facility], [place] [address] [place], [place] [address]  Vitamin B12     Status: Abnormal   Collection Time: 05/04/17 10:39 AM  Result Value Ref Range   Vitamin B-12 1,247 (H) 180 - 914 pg/mL    Comment: (NOTE) This assay is not validated for testing neonatal or myeloproliferative syndrome specimens for Vitamin B12 levels. Performed at [facility], [place] [address] [place], [place] [address]   Folate     Status: None   Collection Time: 05/04/17 10:39 AM  Result Value Ref Range   Folate 22.4 >5.9 ng/mL    Comment: Performed at [place] [address], [place], [place] [address]  CBC     Status: Abnormal   Collection Time: 05/05/17  3:19 AM  Result Value Ref Range   WBC 15.4 (H) 4.0 - 10.5 K/uL   RBC 3.73 (L) 3.87 - 5.11 MIL/uL   Hemoglobin 10.0 (L) 12.0 - 15.0 g/dL   HCT 31.1 (L) 36.0 - 46.0 %   MCV 83.4 78.0 - 100.0 fL   MCH 26.8 26.0 - 34.0 pg   MCHC 32.2 30.0 - 36.0 g/dL   RDW 13.6 11.5 - 15.5 %   Platelets 212 150 - 400 K/uL  Basic metabolic panel     Status: Abnormal   Collection Time: 05/05/17  3:19 AM  Result Value Ref Range   Sodium 158 (H) 135 - 145 mmol/L    Comment: REPEATED TO VERIFY DELTA CHECK NOTED SLIGHT HEMOLYSIS  Potassium 4.7 3.5 - 5.1 mmol/L    Comment: REPEATED TO VERIFY DELTA CHECK NOTED    Chloride 127 (H) 101 - 111 mmol/L   CO2 23 22 - 32 mmol/L   Glucose, Bld 82 65 - 99 mg/dL   BUN 14 6 - 20 mg/dL   Creatinine, Ser 1.23 (H) 0.44 - 1.00 mg/dL   Calcium 10.2 8.9 - 10.3 mg/dL   GFR calc non Af Amer 48 (L) >60 mL/min   GFR calc Af Amer 56 (L) >60 mL/min    Comment: (NOTE) The eGFR has been calculated using the CKD EPI equation. This calculation has not been validated in all clinical situations. eGFR's persistently <60 mL/min signify possible Chronic Kidney Disease.    Anion gap 8 5 - 15   Osmolality, urine     Status: Abnormal   Collection Time: 05/05/17  8:32 AM  Result Value Ref Range   Osmolality, Ur 250 (L) 300 - 900 mOsm/kg    Comment: Performed at [place] [address], [place], [place] [address]  Sodium, urine, random     Status: None   Collection Time: 05/05/17  8:32 AM  Result Value Ref Range   Sodium, Ur 77 mmol/L    Comment: Performed at [facility] [address] [place], [place] [address]  Osmolality     Status: Abnormal   Collection Time: 05/05/17  9:13 AM  Result Value Ref Range   Osmolality 328 (HH) 275 - 295 mOsm/kg    Comment: CRITICAL RESULT CALLED TO, READ BACK BY AND VERIFIED WITH: C.FESTERMAN,MT CERT @ [place] 5621 11.10.18 [facility] CRITICAL RESULT CALLED TO, READ BACK BY AND VERIFIED WITH: OLEARY,T AT 12:00PM ON 05/05/17 BY [facility]   Basic metabolic panel     Status: Abnormal   Collection Time: 05/05/17  1:51 PM  Result Value Ref Range   Sodium 153 (H) 135 - 145 mmol/L   Potassium 4.0 3.5 - 5.1 mmol/L   Chloride 121 (H) 101 - 111 mmol/L   CO2 25 22 - 32 mmol/L   Glucose, Bld 104 (H) 65 - 99 mg/dL   BUN 11 6 - 20 mg/dL   Creatinine, Ser 1.22 (H) 0.44 - 1.00 mg/dL   Calcium 10.3 8.9 - 10.3 mg/dL   GFR calc non Af Amer 49 (L) >60 mL/min   GFR calc Af Amer 56 (L) >60 mL/min    Comment: (NOTE) The eGFR has been calculated using the CKD EPI equation. This calculation has not been validated in all clinical situations. eGFR's persistently <60 mL/min signify possible Chronic Kidney Disease.    Anion gap 7 5 - 15    Current Facility-Administered Medications  Medication Dose Route Frequency Provider Last Rate Last Dose  . acetaminophen (TYLENOL) tablet 650 mg  650 mg Oral Q6H PRN Rise Patience, MD   650 mg at 05/03/17 2058   Or  . acetaminophen (TYLENOL) suppository 650 mg  650 mg Rectal Q6H PRN Rise Patience, MD      . dextrose 5 % solution   Intravenous Continuous Elodia Florence., MD 150  mL/hr at 05/05/17 0830 150 mL at 05/05/17 0830  . folic acid injection 1 mg  1 mg Intravenous Daily Elodia Florence., MD   1 mg at 05/05/17 1100  . LORazepam (ATIVAN) injection 1 mg  1 mg Intravenous Q4H PRN Reubin Milan, MD   1 mg at 05/04/17 0436  . LORazepam (ATIVAN) tablet 1  mg  1 mg Oral q morning - 10a Elodia Florence., MD   1 mg at 05/05/17 1100  . LORazepam (ATIVAN) tablet 2 mg  2 mg Oral QHS Elodia Florence., MD        Musculoskeletal: Strength & Muscle Tone: decreased lying in the bed Gait & Station: lying in the bed Patient leans: N/A  Psychiatric Specialty Exam: Physical Exam agrees with primary team note  Review of Systems  Psychiatric/Behavioral: Positive for memory loss. Negative for depression, hallucinations, substance abuse and suicidal ideas. The patient is not nervous/anxious and does not have insomnia.   All other systems reviewed and are negative.   Blood pressure (!) 143/96, pulse 100, temperature 98.6 F (37 C), temperature source Oral, resp. rate 19, height '5\' 4"'$  (1.626 m), weight 157 lb 6.5 oz (71.4 kg), SpO2 100 %.Body mass index is 27.02 kg/m.  General Appearance: Fairly Groomed  Eye Contact:  Good  Speech:  Clear and Coherent  Volume:  Normal  Mood:  "good"  Affect:  Appropriate, Congruent and Full Range  Thought Process:  Coherent  Orientation:  Full (Time, Place, and Person)  Thought Content:  Logical Perceptions: denies AH/VH  Suicidal Thoughts:  No  Homicidal Thoughts:  No  Memory:  Immediate;   Fair Recent;   Fair Remote;   Fair  Judgement:  Good  Insight:  Fair  Psychomotor Activity:  Normal  Concentration:  Concentration: Fair and Attention Span: Fair  Recall:  Good  Fund of Knowledge:  Good  Language:  Good  Akathisia:  No  Handed:  Right  AIMS (if indicated):    mild left arm rigidity, no resting tremors  Assets:  Communication Skills Desire for Improvement  ADL's:  Intact  Cognition:  WNL  Sleep:      Delayed recall 2/3  Assessment Tara Hurst is a 56 year old female with schizoaffective disorder, hypertension, who was brought by EMS after patient called EMS not feeling well. She has been treated for acute encephalopathy with tremors, most likely secondary to benzodiazepine withdrawal and hypernatremia, likely secondary to diabetes insipidus.   Assessment # Schizoaffective disorder # Delirium- resolving Patient is alert and oriented, and demonstrates linear thought process during the interview, although there was a slight occasional disorganization (sudently mentions "Elta Guadeloupe" when asked about her prior psychiatrist, and asked the reason for admission although she did report she is here for confusion) which is likely due to resolving delirium. Her mental status appears to be remarkably improved based on collaterals and chart review. Patient and her friend at the bedside both reports she has been independent and denies any concern for ADL/IADL prior to this event. Noted that although patient did report good adherence to medication, chart review indicated she has stopped medication for three days prior to admission. Although it is unclear whether this was in the context of altered mental status/hypernatremia, she is willing to continue her meds and agrees to see outpatient psychiatrist. Will restart her home meds from lower dose with uptitration as follows. Will plan to uptitrate risperidone to 4 mg (she used to be on 6 mg ) given mild cogwheel rigidity on left arm and lack of significant psychotic symptoms. Noted that benztropine, ativan can contribute to delirium; please do uptitration as indicated. Will defer further medication changes to her outpatient psychiatrist.   [place] home meds as follows;  - Start risperidone 1 mg tonight, then 1 mg BID for one day, then 2 mg BID  for schizoaffective disorder - Start Wellbutrin 150 mg daily for three days (from 11/11), then 300 mg daily for  depression - Start benztropine 0.5 mg BID for EPS  - Continue ativan 1 mg TID for anxiety  - Please contact with SW and arrange psychiatry outpatient follow up (Patient used to be seen at [facility]) - We will sign off. Please contact psychiatry consult for re-evaluation or any questions.   Treatment Plan Summary: Plan as above  Disposition: No evidence of imminent risk to self or others at present.   Patient does not meet criteria for psychiatric inpatient admission.  Norman Clay, MD 05/05/2017 2:47 PM

## 2017-05-05 NOTE — Progress Notes (Signed)
Dr. Florene Glen made aware of rising blood pressures. No new orders at this time. Instructed to continue to monitor for now.

## 2017-05-06 DIAGNOSIS — E232 Diabetes insipidus: Secondary | ICD-10-CM

## 2017-05-06 LAB — CBC
HEMATOCRIT: 28.5 % — AB (ref 36.0–46.0)
Hemoglobin: 9.2 g/dL — ABNORMAL LOW (ref 12.0–15.0)
MCH: 26.5 pg (ref 26.0–34.0)
MCHC: 32.3 g/dL (ref 30.0–36.0)
MCV: 82.1 fL (ref 78.0–100.0)
Platelets: 211 10*3/uL (ref 150–400)
RBC: 3.47 MIL/uL — ABNORMAL LOW (ref 3.87–5.11)
RDW: 13.6 % (ref 11.5–15.5)
WBC: 13.5 10*3/uL — ABNORMAL HIGH (ref 4.0–10.5)

## 2017-05-06 LAB — BASIC METABOLIC PANEL
Anion gap: 6 (ref 5–15)
BUN: 13 mg/dL (ref 6–20)
CALCIUM: 9.8 mg/dL (ref 8.9–10.3)
CO2: 26 mmol/L (ref 22–32)
CREATININE: 1.32 mg/dL — AB (ref 0.44–1.00)
Chloride: 117 mmol/L — ABNORMAL HIGH (ref 101–111)
GFR calc non Af Amer: 44 mL/min — ABNORMAL LOW (ref >60)
GFR, EST AFRICAN AMERICAN: 51 mL/min — AB (ref >60)
Glucose, Bld: 97 mg/dL (ref 65–99)
Potassium: 3.6 mmol/L (ref 3.5–5.1)
SODIUM: 149 mmol/L — AB (ref 135–145)

## 2017-05-06 MED ORDER — DEXTROSE 5 % IV SOLN
INTRAVENOUS | Status: AC
  Administered 2017-05-06 (×2): via INTRAVENOUS

## 2017-05-06 NOTE — Progress Notes (Signed)
Pt has arrived to Room 1521 from ICU. VS WNL. Pt without c/o.

## 2017-05-06 NOTE — Plan of Care (Signed)
  Progressing Activity: Risk for activity intolerance will decrease 05/06/2017 0825 - Progressing by Hermelinda Dellen, RN Nutrition: Adequate nutrition will be maintained 05/06/2017 0825 - Progressing by Hermelinda Dellen, RN Coping: Level of anxiety will decrease 05/06/2017 0825 - Progressing by Hermelinda Dellen, RN

## 2017-05-06 NOTE — Progress Notes (Addendum)
PROGRESS NOTE    Tara Hurst  UXL:[id number] DOB: 23-Mar-1961 DOA: 05/02/2017 PCP: Patient, No Pcp Per   Brief Narrative:  Tara Hurst is a 56 y.o. female with history of schizoaffective disorder, hypertension was brought to the ER by EMS after patient called EMS patient was not feeling well.  Patient states that over the last few days she has not taken her medications since she thought it would help her.  Denies any chest pain shortness of breath nausea vomiting diarrhea headache or visual symptoms.   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 Acute encephalopathy with tremors - Treating for benzodiazepine withdrawal.  Initially concern for NMS vs serotonin syndrome given sx. She's improved with scheduled ativan and CIWA 1. Resume home ativan, CIWA (less than 8 over past 24 hours).   2. Safety sitter 3. Appreciate neuro recs (rigidity and tremors may be related to d/c cogentin) 4. CK nl, Ammonia nl, TSH normal 5. Negative urine tox, etoh, salicylates, APAP.  Head CT unrevealing.  CXR without concerning findings.  2. Hypernatremia:  Appears euvolemic.  Suspect multifactorial.  Likely related to NS administration, but patient also has had 4 L of UOP over past 24 hours.  With hx of lithium use, has hx of possible diabetes insipidus in chart (could also consider postobstructive diuresis with her retaining urine on admission over 900 cc retained).  Improving. 1. 4.5 L Free water deficit 2. Will start D5W to correct.  Discussed with nursing to have free water available at bedside. 3. Check urine sodium 77, urine osm 250, serum osm 328.  Will check renal US (complicated cystic mass, medical renal disease, stones).   3. Tachycardia: suspect due to above.  Improved.  Continue IVF, telemetry.   4. Leukocytosis: Improved today, but  afebrile.  Will continue to monitor for now.   5. History of hypertension per the chart.  CTM.   6. History of schizoaffective disorder - Restarted meds per psych recs.  7. Acute renal failure possible dehydration.  Creatinine appears to be close to baseline currently.  8. Urinary retention: pt with >999 on bladder.  Foley placed.  Ucx with insignificant growth.  Ctm.  Trial without foley today.  9. Anemia: iron panel, ferritin, b12, folate wnl.   DVT prophylaxis: SCD Code Status: full  Family Communication: none at bedside Disposition Plan: pending improvemment  Consultants:   neurology  Procedures: (Don't include imaging studies which can be auto populated. Include things that cannot be auto populated i.e. Echo, Carotid and venous dopplers, Foley, Bipap, HD, tubes/drains, wound vac, central lines etc)  none  Antimicrobials: (specify start and planned stop date. Auto populated tables are space occupying and do not give end dates)  none    Subjective: Sleeping. No complaints.   Feeling better.  Objective: Vitals:   05/06/17 0000 05/06/17 0200 05/06/17 0400 05/06/17 0600  BP: 120/69 (!) 142/68 (!) 149/96 (!) 153/88  Pulse: 74 75 79 (!) 102  Resp: 19 (!) 22 (!) 23 (!) 22  Temp: (!) 97.5 F (36.4 C)  (!) 97.4 F (36.3 C)   TempSrc: Oral  Oral   SpO2: 98% 99% 97% 99%  Weight:      Height:        Intake/Output Summary (Last 24 hours) at 05/06/2017 0813 Last data filed at 05/06/2017 0600  Gross per 24 hour  Intake 4980.84 ml  Output 4690 ml  Net 290.84 ml   Filed Weights   05/05/17 1200  Weight: 71.4 kg (157 lb 6.5 oz)    Examination:  General: No acute distress. Cardiovascular: Heart sounds show a regular rate, and rhythm. No gallops or rubs. No murmurs. No JVD. Lungs: Clear to auscultation bilaterally with good air movement. No rales, rhonchi or wheezes. Abdomen: Soft, nontender, nondistended with normal active bowel sounds. No masses. No  hepatosplenomegaly. Neurological: Sleepy. Moves all extremities 4 with equal strength. Cranial nerves II through XII grossly intact. Skin: Warm and dry. No rashes or lesions. Extremities: No clubbing or cyanosis. No edema.   Data Reviewed: I have personally reviewed following labs and imaging studies  CBC: Recent Labs  Lab 05/02/17 1918 05/03/17 0358 05/03/17 0709 05/04/17 0346 05/05/17 0319 05/06/17 0344  WBC 13.3*  --  13.3* 12.8* 15.4* 13.5*  NEUTROABS  --   --  9.7*  --   --   --   HGB 12.1 11.9* 10.9* 9.6* 10.0* 9.2*  HCT 35.5* 35.0* 31.9* 28.7* 31.1* 28.5*  MCV 81.1  --  80.8 82.0 83.4 82.1  PLT 279  --  233 210 212 347   Basic Metabolic Panel: Recent Labs  Lab 05/03/17 0709 05/04/17 0346 05/05/17 0319 05/05/17 1351 05/05/17 1806 05/06/17 0344  NA 147* 149* 158* 153* 152* 149*  K 3.6 3.3* 4.7 4.0 4.0 3.6  CL 113* 120* 127* 121* 118* 117*  CO2 23 24 23 25 27 26   GLUCOSE 91 90 82 104* 130* 97  BUN 15 12 14 11 10 13   CREATININE 1.25* 1.33* 1.23* 1.22* 1.29* 1.32*  CALCIUM 10.1 9.3 10.2 10.3 10.2 9.8  MG 2.6* 2.1  --   --   --   --   PHOS  --  2.4*  --   --   --   --    GFR: Estimated Creatinine Clearance: 46.1 mL/min (A) (by C-G formula based on SCr of 1.32 mg/dL (H)). Liver Function Tests: Recent Labs  Lab 05/02/17 1918 05/03/17 0709 05/04/17 0346  AST 23 19 18   ALT 17 16 12*  ALKPHOS 107 92 73  BILITOT 0.9 0.5 0.4  PROT 8.8* 7.6 6.1*  ALBUMIN 5.0 4.4 3.5   No results for input(s): LIPASE, AMYLASE in the last 168 hours. Recent Labs  Lab 05/03/17 1449  AMMONIA 24   Coagulation Profile: No results for input(s): INR, PROTIME in the last 168 hours. Cardiac Enzymes: Recent Labs  Lab 05/03/17 0709  CKTOTAL 207   BNP (last 3 results) No results for input(s): PROBNP in the last 8760 hours. HbA1C: No results for input(s): HGBA1C in the last 72 hours. CBG: Recent Labs  Lab 05/02/17 1923  GLUCAP 148*   Lipid Profile: No results for  input(s): CHOL, HDL, LDLCALC, TRIG, CHOLHDL, LDLDIRECT in the last 72 hours. Thyroid Function Tests: No results for input(s): TSH, T4TOTAL, FREET4, T3FREE, THYROIDAB in the last 72 hours. Anemia Panel: Recent Labs    05/04/17 1039  VITAMINB12 1,247*  FOLATE 22.4  FERRITIN 210  TIBC 255  IRON 35   Sepsis Labs: Recent Labs  Lab 05/02/17 1936  LATICACIDVEN 1.84    Recent Results (from the past 240 hour(s))  MRSA PCR Screening     Status: None   Collection Time: 05/03/17  6:40 AM  Result Value Ref Range Status   MRSA by PCR NEGATIVE NEGATIVE Final    Comment:  The GeneXpert MRSA Assay (FDA approved for NASAL specimens only), is one component of a comprehensive MRSA colonization surveillance program. It is not intended to diagnose MRSA infection nor to guide or monitor treatment for MRSA infections.   Culture, Urine     Status: Abnormal   Collection Time: 05/03/17  3:00 PM  Result Value Ref Range Status   Specimen Description URINE, CATHETERIZED  Final   Special Requests NONE  Final   Culture (A)  Final    <10,000 COLONIES/mL INSIGNIFICANT GROWTH Performed at [facility], [address] [place], [place] [address]    Report Status 05/04/2017 FINAL  Final         Radiology Studies: US Renal  Result Date: 05/05/2017 CLINICAL DATA:  Acute renal failure. EXAM: RENAL / URINARY TRACT ULTRASOUND COMPLETE COMPARISON:  September 26, 2013 FINDINGS: Right Kidney: Length: 10.6 cm. Suggested small stones. Mildly prominent renal pelvis without caliectasis. A 1.6 cm cyst is identified in the midpole. Increased cortical echogenicity. Left Kidney: Length: 10.9 cm. Possible nonobstructive stones. A 2 cm cyst is seen in the upper pole. A complicated cystic mass in the midpole with septations measures 1.5 x 3.4 x 2.0 cm today this mass measured 1.9 x 2.9 x 2.2 cm in April of 2015. Increased cortical echogenicity. Bladder: Decompressed with a Foley catheter. IMPRESSION: 1.  Complicated cystic mass in the left mid kidney measuring 1.5 x 3.4 x 2.0 cm today versus 1.9 x 2.9 x 2.2 cm in April 2015. There have not likely been significant growth taking difference in slice selection into account. Recommend continued attention on follow-up. Other simple cysts in both kidneys. 2. Medical renal disease. 3. Suggested small nonobstructive stones. Electronically Signed   By: Dorise Bullion III M.D   On: 05/05/2017 14:31        Scheduled Meds: . benztropine  0.5 mg Oral BID  . buPROPion  150 mg Oral Daily  . [START ON 05/09/2017] buPROPion  300 mg Oral Daily  . enoxaparin (LOVENOX) injection  40 mg Subcutaneous Q24H  . folic acid  1 mg Intravenous Daily  . LORazepam  1 mg Oral q morning - 10a  . LORazepam  2 mg Oral QHS  . multivitamin with minerals  1 tablet Oral Daily  . risperiDONE  1 mg Oral BID  . [START ON 05/07/2017] risperiDONE  2 mg Oral BID  . thiamine  100 mg Oral Daily   Or  . thiamine  100 mg Intravenous Daily   Continuous Infusions: . dextrose 125 mL/hr at 05/05/17 1503     LOS: 3 days    Time spent: over 20 minutes    Fayrene Helper, MD [facility] Pager [phone]   If 7PM-7AM, please contact night-coverage [email or web address] Password [facility] 05/06/2017, 8:13 AM

## 2017-05-07 ENCOUNTER — Inpatient Hospital Stay (HOSPITAL_COMMUNITY): Payer: Medicaid Other

## 2017-05-07 LAB — BASIC METABOLIC PANEL
Anion gap: 8 (ref 5–15)
BUN: 16 mg/dL (ref 6–20)
CHLORIDE: 117 mmol/L — AB (ref 101–111)
CO2: 26 mmol/L (ref 22–32)
Calcium: 10.1 mg/dL (ref 8.9–10.3)
Creatinine, Ser: 1.4 mg/dL — ABNORMAL HIGH (ref 0.44–1.00)
GFR calc Af Amer: 48 mL/min — ABNORMAL LOW (ref >60)
GFR calc non Af Amer: 41 mL/min — ABNORMAL LOW (ref >60)
GLUCOSE: 93 mg/dL (ref 65–99)
POTASSIUM: 4.1 mmol/L (ref 3.5–5.1)
Sodium: 151 mmol/L — ABNORMAL HIGH (ref 135–145)

## 2017-05-07 LAB — CBC
HEMATOCRIT: 30.9 % — AB (ref 36.0–46.0)
HEMOGLOBIN: 10.2 g/dL — AB (ref 12.0–15.0)
MCH: 27.3 pg (ref 26.0–34.0)
MCHC: 33 g/dL (ref 30.0–36.0)
MCV: 82.6 fL (ref 78.0–100.0)
Platelets: 213 10*3/uL (ref 150–400)
RBC: 3.74 MIL/uL — AB (ref 3.87–5.11)
RDW: 13.5 % (ref 11.5–15.5)
WBC: 20.3 10*3/uL — ABNORMAL HIGH (ref 4.0–10.5)

## 2017-05-07 LAB — URINALYSIS, ROUTINE W REFLEX MICROSCOPIC
Bilirubin Urine: NEGATIVE
Glucose, UA: NEGATIVE mg/dL
KETONES UR: NEGATIVE mg/dL
Nitrite: POSITIVE — AB
PROTEIN: NEGATIVE mg/dL
Specific Gravity, Urine: 1.004 — ABNORMAL LOW (ref 1.005–1.030)
Squamous Epithelial / LPF: NONE SEEN
pH: 7 (ref 5.0–8.0)

## 2017-05-07 MED ORDER — FOLIC ACID 1 MG PO TABS
1.0000 mg | ORAL_TABLET | Freq: Every day | ORAL | Status: DC
  Administered 2017-05-08 – 2017-05-10 (×3): 1 mg via ORAL
  Filled 2017-05-07 (×3): qty 1

## 2017-05-07 MED ORDER — DEXTROSE 5 % IV SOLN
INTRAVENOUS | Status: DC
  Administered 2017-05-07 – 2017-05-08 (×3): via INTRAVENOUS

## 2017-05-07 NOTE — Evaluation (Signed)
Physical Therapy Evaluation Patient Details Name: Tara Hurst MRN: [id number] DOB: 07/20/1960 Today's Date: 05/07/2017   History of Present Illness  56 yo female admitted with acute encephalopathy, tremors, rigidity, benzo withdrawal. Hx of schizoaffective d/o, HTn, lithium use    Clinical Impression  On eval, pt was Min guard assist for mobility. She walked ~115 feet with a RW then another 25 feet without an assistive device. No overt LOB during session. Pt tends to take very small steps and she has little to no arm swing during gait. Discussed d/c plan-pt stated that she lives alone. At this time, do not anticipate any f/u PT needs. Would recommend a home safety evaluation should the pt return home. Will follow.     Follow Up Recommendations Home Safety Evaluation; No PT follow up;Supervision - Intermittent    Equipment Recommendations  None recommended by PT    Recommendations for Other Services       Precautions / Restrictions Precautions Precautions: Fall Restrictions Weight Bearing Restrictions: No      Mobility  Bed Mobility Overal bed mobility: Independent                Transfers Overall transfer level: Modified independent                  Ambulation/Gait Ambulation/Gait assistance: Min guard Ambulation Distance (Feet): 115 Feet(25x1) Assistive device: Rolling walker (2 wheeled);None Gait Pattern/deviations: Step-through pattern;Decreased step length - right;Decreased step length - left;Decreased stride length     General Gait Details: Walked ~115 feet with a RW. Then walked another 25 feet without an assistive device. Close guard for safety during both walks. Pt tolerated distance well. No c/o dizziness or weakness. No overt LOB.  Stairs            Wheelchair Mobility    Modified Rankin (Stroke Patients Only)       Balance                                             Pertinent Vitals/Pain Pain Assessment:  No/denies pain    Home Living Family/patient expects to be discharged to:: Unsure Living Arrangements: Alone   Type of Home: House Home Access: Stairs to enter   [employer] of Steps: 1 Home Layout: One level Home Equipment: None Additional Comments: pt provided some information but unsure of accuracy    Prior Function Level of Independence: Independent         Comments: pt provided some information but unsure of accuracy. Per pt, she was Independent     Hand Dominance        Extremity/Trunk Assessment   Upper Extremity Assessment Upper Extremity Assessment: Overall WFL for tasks assessed    Lower Extremity Assessment Lower Extremity Assessment: Generalized weakness    Cervical / Trunk Assessment Cervical / Trunk Assessment: Normal  Communication   Communication: No difficulties  Cognition Arousal/Alertness: Awake/alert Behavior During Therapy: WFL for tasks assessed/performed Overall Cognitive Status: History of cognitive impairments - at baseline                                        General Comments      Exercises     Assessment/Plan    PT Assessment Patient needs continued PT services  PT Problem List Decreased mobility;Decreased balance;Decreased cognition       PT Treatment Interventions Gait training;Functional mobility training;Therapeutic activities;Therapeutic exercise;Patient/family education;DME instruction    PT Goals (Current goals can be found in the Care Plan section)  Acute Rehab PT Goals Patient Stated Goal: none stated PT Goal Formulation: With patient Time For Goal Achievement: 05/21/17 Potential to Achieve Goals: Good    Frequency Min 3X/week   Barriers to discharge        Co-evaluation               AM-PAC PT "6 Clicks" Daily Activity  Outcome Measure Difficulty turning over in bed (including adjusting bedclothes, sheets and blankets)?: None Difficulty moving from lying on back to  sitting on the side of the bed? : None Difficulty sitting down on and standing up from a chair with arms (e.g., wheelchair, bedside commode, etc,.)?: None Help needed moving to and from a bed to chair (including a wheelchair)?: A Little Help needed walking in hospital room?: A Little Help needed climbing 3-5 steps with a railing? : A Little 6 Click Score: 21    End of Session Equipment Utilized During Treatment: Gait belt Activity Tolerance: Patient tolerated treatment well Patient left: in chair;with call bell/phone within reach;with chair alarm set   PT Visit Diagnosis: Muscle weakness (generalized) (M62.81);Difficulty in walking, not elsewhere classified (R26.2)    Time: 1572-6203 PT Time Calculation (min) (ACUTE ONLY): 25 min   Charges:   PT Evaluation $PT Eval Moderate Complexity: 1 Mod PT Treatments $Gait Training: 8-22 mins   PT G Codes:          Weston Anna, MPT Pager: [phone]

## 2017-05-07 NOTE — Progress Notes (Signed)
PROGRESS NOTE    Tara Hurst  FXT:[id number] DOB: December 10, 1960 DOA: 05/02/2017 PCP: Patient, No Pcp Per   Brief Narrative:  Tara Hurst is Tara Hurst 56 y.o. female with history of schizoaffective disorder, hypertension was brought to the ER by EMS after patient called EMS patient was not feeling well.  Patient states that over the last few days she has not taken her medications since she thought it would help her.  Denies any chest pain shortness of breath nausea vomiting diarrhea headache or visual symptoms.   Assessment & Plan:   Principal Problem:   Acute encephalopathy Active Problems:   Schizoaffective disorder ([place])   Hypertension   ARF (acute renal failure) (HCC)   Benzodiazepine withdrawal ([place])   1. Acute encephalopathy with tremors - Improved after treatment for benzodiazepine withdrawal.  Initially concern for NMS vs serotonin syndrome given sx. She's improved with scheduled ativan and CIWA 1. Resume home ativan 2. Appreciate neuro recs (rigidity and tremors may be related to d/c cogentin) 3. CK nl, Ammonia nl, TSH normal 4. Negative urine tox, etoh, salicylates, APAP.  Head CT unrevealing.  CXR without concerning findings.  2. Hypernatremia:  Appears euvolemic.  Suspect multifactorial.  Likely related to NS administration, but patient also has had 4 L of UOP over past 24 hours.  With hx of lithium use, has hx of possible diabetes insipidus in chart (could also consider postobstructive diuresis with her retaining urine on admission over 900 cc retained). 151 today.  1. Initially with 4.5 L Free water deficit 2. Restart D5W, encourage free water 3. Check urine sodium 77, urine osm 250, serum osm 328.  Will check renal US (complicated cystic mass, medical renal disease, stones).  Will need renal f/u.  4. Consider renal c/s   3. Fever  Leukocytosis:  No clear signs and sx of infection.  F/u bcx, UA and cx, and CXR.   4. Tachycardia: suspect due to above.  Improved.  Continue IVF,  telemetry.   5. Leukocytosis: Worsening today, as above.  6. History of hypertension per the chart.  CTM.   7. History of schizoaffective disorder - Restarted meds per psych recs.  8. Acute renal failure possible dehydration.  Creatinine appears to be close to baseline currently.  9. Urinary retention: Pt with large amount of UOP with NDI above, has bladder scans that look like she's retaining, but having brisk UOP without foley.  CTM.   10. Anemia: iron panel, ferritin, b12, folate wnl.   DVT prophylaxis: lovenox Code Status: full  Family Communication: none at bedside Disposition Plan: pending improvemment  Consultants:   neurology  Procedures: (Don't include imaging studies which can be auto populated. Include things that cannot be auto populated i.e. Echo, Carotid and venous dopplers, Foley, Bipap, HD, tubes/drains, wound vac, central lines etc)  none  Antimicrobials: (specify start and planned stop date. Auto populated tables are space occupying and do not give end dates)  none    Subjective: Sitting up in chair. Feeling well. No pain, fevers, chills, cough, dysuria.   Objective: Vitals:   05/06/17 1544 05/06/17 2050 05/07/17 0626 05/07/17 1457  BP:  120/72 118/87 121/80  Pulse:  (!) 114 97 99  Resp:  18 18 18   Temp:  (!) 100.6 F (38.1 C) 98.7 F (37.1 C) 97.6 F (36.4 C)  TempSrc:  Oral Oral Oral  SpO2:  98% 100% 100%  Weight: 73 kg (160 lb 15 oz)  76 kg (167 lb 8.8 oz)  Height:        Intake/Output Summary (Last 24 hours) at 05/07/2017 1533 Last data filed at 05/07/2017 1300 Gross per 24 hour  Intake 2980 ml  Output 5700 ml  Net -2720 ml   Filed Weights   05/06/17 1500 05/06/17 1544 05/07/17 0626  Weight: 73 kg (160 lb 15 oz) 73 kg (160 lb 15 oz) 76 kg (167 lb 8.8 oz)    Examination:  General: No acute distress. Cardiovascular: Heart sounds show [name] regular rate, and rhythm. No gallops or rubs. No murmurs. No JVD. Lungs: Clear to  auscultation bilaterally with good air movement. No rales, rhonchi or wheezes. Abdomen: Soft, nontender, nondistended with normal active bowel sounds. No masses. No hepatosplenomegaly. Neurological: Alert and oriented 3. Moves all extremities 4 with equal strength. Cranial nerves II through XII grossly intact. Skin: Warm and dry. No rashes or lesions. Extremities: No clubbing or cyanosis. No edema. Pedal pulses 2+. Psychiatric: Mood and affect are normal. Insight and judgment are appropriate.   Data Reviewed: I have personally reviewed following labs and imaging studies  CBC: Recent Labs  Lab 05/03/17 0709 05/04/17 0346 05/05/17 0319 05/06/17 0344 05/07/17 0517  WBC 13.3* 12.8* 15.4* 13.5* 20.3*  NEUTROABS 9.7*  --   --   --   --   HGB 10.9* 9.6* 10.0* 9.2* 10.2*  HCT 31.9* 28.7* 31.1* 28.5* 30.9*  MCV 80.8 82.0 83.4 82.1 82.6  PLT 233 210 212 211 193   Basic Metabolic Panel: Recent Labs  Lab 05/03/17 0709 05/04/17 0346 05/05/17 0319 05/05/17 1351 05/05/17 1806 05/06/17 0344 05/07/17 0517  NA 147* 149* 158* 153* 152* 149* 151*  K 3.6 3.3* 4.7 4.0 4.0 3.6 4.1  CL 113* 120* 127* 121* 118* 117* 117*  CO2 23 24 23 25 27 26 26   GLUCOSE 91 90 82 104* 130* 97 93  BUN 15 12 14 11 10 13 16   CREATININE 1.25* 1.33* 1.23* 1.22* 1.29* 1.32* 1.40*  CALCIUM 10.1 9.3 10.2 10.3 10.2 9.8 10.1  MG 2.6* 2.1  --   --   --   --   --   PHOS  --  2.4*  --   --   --   --   --    GFR: Estimated Creatinine Clearance: 44.8 mL/min ([name]) (by C-G formula based on SCr of 1.4 mg/dL (H)). Liver Function Tests: Recent Labs  Lab 05/02/17 1918 05/03/17 0709 05/04/17 0346  AST 23 19 18   ALT 17 16 12*  ALKPHOS 107 92 73  BILITOT 0.9 0.5 0.4  PROT 8.8* 7.6 6.1*  ALBUMIN 5.0 4.4 3.5   No results for input(s): LIPASE, AMYLASE in the last 168 hours. Recent Labs  Lab 05/03/17 1449  AMMONIA 24   Coagulation Profile: No results for input(s): INR, PROTIME in the last 168 hours. Cardiac  Enzymes: Recent Labs  Lab 05/03/17 0709  CKTOTAL 207   BNP (last 3 results) No results for input(s): PROBNP in the last 8760 hours. HbA1C: No results for input(s): HGBA1C in the last 72 hours. CBG: Recent Labs  Lab 05/02/17 1923  GLUCAP 148*   Lipid Profile: No results for input(s): CHOL, HDL, LDLCALC, TRIG, CHOLHDL, LDLDIRECT in the last 72 hours. Thyroid Function Tests: No results for input(s): TSH, T4TOTAL, FREET4, T3FREE, THYROIDAB in the last 72 hours. Anemia Panel: No results for input(s): VITAMINB12, FOLATE, FERRITIN, TIBC, IRON, RETICCTPCT in the last 72 hours. Sepsis Labs: Recent Labs  Lab 05/02/17 1936  LATICACIDVEN 1.84  Recent Results (from the past 240 hour(s))  MRSA PCR Screening     Status: None   Collection Time: 05/03/17  6:40 AM  Result Value Ref Range Status   MRSA by PCR NEGATIVE NEGATIVE Final    Comment:        The GeneXpert MRSA Assay (FDA approved for NASAL specimens only), is one component of [employer] comprehensive MRSA colonization surveillance program. It is not intended to diagnose MRSA infection nor to guide or monitor treatment for MRSA infections.   Culture, Urine     Status: Abnormal   Collection Time: 05/03/17  3:00 PM  Result Value Ref Range Status   Specimen Description URINE, CATHETERIZED  Final   Special Requests NONE  Final   Culture (Loella Hickle)  Final    <10,000 COLONIES/mL INSIGNIFICANT GROWTH Performed at [facility], [address] [place], [place] [address]    Report Status 05/04/2017 FINAL  Final         Radiology Studies: Dg Chest Port 1 View  Result Date: 05/07/2017 CLINICAL DATA:  Hypertension.  Fever. EXAM: PORTABLE CHEST 1 VIEW COMPARISON:  05/02/2017 FINDINGS: Heart size is normal. Mediastinal shadows are normal. There is patchy density at both lung bases that could be atelectasis or mild pneumonia. No dense consolidation or lobar collapse. No abnormal bone finding. IMPRESSION: Development of patchy density  at both lung bases right more than left that could be atelectasis or mild pneumonia. Electronically Signed   By: Nelson Chimes M.D.   On: 05/07/2017 13:48        Scheduled Meds: . benztropine  0.5 mg Oral BID  . buPROPion  150 mg Oral Daily  . [START ON 05/09/2017] buPROPion  300 mg Oral Daily  . enoxaparin (LOVENOX) injection  40 mg Subcutaneous Q24H  . [START ON 12/20/9483] folic acid  1 mg Oral Daily  . LORazepam  1 mg Oral q morning - 10a  . LORazepam  2 mg Oral QHS  . multivitamin with minerals  1 tablet Oral Daily  . risperiDONE  2 mg Oral BID  . thiamine  100 mg Oral Daily   Or  . thiamine  100 mg Intravenous Daily   Continuous Infusions: . dextrose 125 mL/hr at 05/07/17 1431     LOS: 4 days    Time spent: over 20 minutes    Fayrene Helper, MD [facility] Pager [phone]   If 7PM-7AM, please contact night-coverage [email or web address] Password [facility] 05/07/2017, 3:33 PM

## 2017-05-07 NOTE — Progress Notes (Signed)
PHARMACIST - PHYSICIAN COMMUNICATION  CONCERNING: IV to Oral Route Change Policy  RECOMMENDATION: This patient is receiving folic acid by the intravenous route.  Based on criteria approved by the Pharmacy and Therapeutics Committee, the intravenous medication(s) is/are being converted to the equivalent oral dose form(s).   DESCRIPTION: These criteria include:  The patient is eating (either orally or via tube) and/or has been taking other orally administered medications for a least 24 hours  The patient has no evidence of active gastrointestinal bleeding or impaired GI absorption (gastrectomy, short bowel, patient on TNA or NPO).  If you have questions about this conversion, please contact the Pharmacy Department  []   [phone] )  [facility] []   [phone] )  [facility] []   [phone] )  [facility] []   [phone] )  [facility] [x]   [phone] )  [place] 05/07/2017, 10:44 AM Pager [phone]

## 2017-05-08 ENCOUNTER — Inpatient Hospital Stay (HOSPITAL_COMMUNITY): Payer: Medicaid Other

## 2017-05-08 ENCOUNTER — Encounter (HOSPITAL_COMMUNITY): Payer: Self-pay | Admitting: Student

## 2017-05-08 LAB — BASIC METABOLIC PANEL
ANION GAP: 4 — AB (ref 5–15)
BUN: 19 mg/dL (ref 6–20)
CALCIUM: 9.9 mg/dL (ref 8.9–10.3)
CO2: 28 mmol/L (ref 22–32)
CREATININE: 1.28 mg/dL — AB (ref 0.44–1.00)
Chloride: 111 mmol/L (ref 101–111)
GFR, EST AFRICAN AMERICAN: 53 mL/min — AB (ref >60)
GFR, EST NON AFRICAN AMERICAN: 46 mL/min — AB (ref >60)
Glucose, Bld: 99 mg/dL (ref 65–99)
POTASSIUM: 3.5 mmol/L (ref 3.5–5.1)
Sodium: 143 mmol/L (ref 135–145)

## 2017-05-08 LAB — CBC
HEMATOCRIT: 30.1 % — AB (ref 36.0–46.0)
Hemoglobin: 10 g/dL — ABNORMAL LOW (ref 12.0–15.0)
MCH: 27.2 pg (ref 26.0–34.0)
MCHC: 33.2 g/dL (ref 30.0–36.0)
MCV: 82 fL (ref 78.0–100.0)
PLATELETS: 197 10*3/uL (ref 150–400)
RBC: 3.67 MIL/uL — AB (ref 3.87–5.11)
RDW: 13.4 % (ref 11.5–15.5)
WBC: 13.6 10*3/uL — AB (ref 4.0–10.5)

## 2017-05-08 MED ORDER — CEFTRIAXONE SODIUM 1 G IJ SOLR
1.0000 g | INTRAMUSCULAR | Status: DC
  Administered 2017-05-08: 1 g via INTRAVENOUS
  Filled 2017-05-08: qty 10

## 2017-05-08 NOTE — Progress Notes (Signed)
PROGRESS NOTE    Tara Hurst  EHU:[id number] DOB: 1960-11-04 DOA: 05/02/2017 PCP: Patient, No Pcp Per   Brief Narrative:  Tara Hurst is Tara Hurst 56 y.o. female with history of schizoaffective disorder, hypertension was brought to the ER by EMS after patient called EMS patient was not feeling well.  Patient states that over the last few days she has not taken her medications since she thought it would help her.  Denies any chest pain shortness of breath nausea vomiting diarrhea headache or visual symptoms.   Assessment & Plan:   Principal Problem:   Acute encephalopathy Active Problems:   Schizoaffective disorder (Tara Hurst)   Hypertension   ARF (acute renal failure) (Tara Hurst)   Benzodiazepine withdrawal (Tara Hurst)   1. Acute encephalopathy with tremors - Improved after treatment for benzodiazepine withdrawal.  Initially concern for NMS vs serotonin syndrome given sx. She's improved with scheduled ativan and CIWA 1. Resume home ativan 2. Appreciate neuro recs (rigidity and tremors may be related to d/c cogentin) 3. CK nl, Ammonia nl, TSH normal 4. Negative urine tox, etoh, salicylates, APAP.  Head CT unrevealing.  CXR without concerning findings.  2. Hypernatremia:  Appears euvolemic.  Suspect multifactorial.  Likely related to NS administration, but patient also has had 4 L of UOP over past 24 hours.  With hx of lithium use, has hx of possible diabetes insipidus in chart (could also consider postobstructive diuresis with her retaining urine on admission over 900 cc retained). Improved to 143 today from 158 with IVF.  1. Initially with 4.5 L Free water deficit 2. Will continue to encourage free water and observe patient without IVF to ensure she's able to keep up with her UOP.  3. Check urine sodium 77, urine osm 250, serum osm 328.  Will check renal US (complicated cystic mass, medical renal disease, stones).  Will need renal f/u.  4. Consider renal c/s as needed.  Will need renal follow up for the  complicated cystic mass.    3. Fever  Leukocytosis:  No clear signs and sx of infection.  CXR with small bilateral pleural effusions (no definite pneumonia or edema).  Likely 2/2 UTI 1. Ceftriaxone 11/13 -   4. Tachycardia: suspect due to above.  Improved.  Continue IVF, telemetry.   5. Leukocytosis: Worsening today, as above.  6. History of hypertension per the chart.  CTM.   7. History of schizoaffective disorder - Restarted meds per psych recs. 1. Per psych - benztropine 0.5 mg BID, welbutrin 150 mg daily then 300 mg daily after 3 days.  Risperdone to uptitrate to 2 mg BID.  Also ativan 1mg  TID.  Recommended contacting social work to arrange outpatient psych follow up (SW saw and discussed [facility] and other outpatient resources).    8. Acute renal failure possible dehydration.  Creatinine appears to be close to baseline currently.  9. Urinary retention: Pt with large amount of UOP with NDI above, has bladder scans that look like she's retaining, but having brisk UOP without foley.  CTM.   10. Anemia: iron panel, ferritin, b12, folate wnl.   DVT prophylaxis: lovenox Code Status: full  Family Communication: none at bedside Disposition Plan: pending improvemment  Consultants:   neurology  Procedures: (Don't include imaging studies which can be auto populated. Include things that cannot be auto populated i.e. Echo, Carotid and venous dopplers, Foley, Bipap, HD, tubes/drains, wound vac, central lines etc)  none  Antimicrobials: (specify start and planned stop date. Auto populated tables are  space occupying and do not give end dates)  none    Subjective: Asking when she can go home. Feels well.    Objective: Vitals:   05/07/17 1457 05/07/17 2123 05/08/17 0525 05/08/17 1400  BP: 121/80 120/79 118/82 108/81  Pulse: 99 88 88 94  Resp: 18 20 20    Temp: 97.6 F (36.4 C) 98.3 F (36.8 C) 98.7 F (37.1 C) 97.8 F (36.6 C)  TempSrc: Oral Oral Oral Oral  SpO2: 100% 98%  100% 100%  Weight:   74.6 kg (164 lb 7.4 oz)   Height:        Intake/Output Summary (Last 24 hours) at 05/08/2017 1509 Last data filed at 05/08/2017 1400 Gross per 24 hour  Intake 4755.41 ml  Output 5600 ml  Net -844.59 ml   Filed Weights   05/06/17 1544 05/07/17 0626 05/08/17 0525  Weight: 73 kg (160 lb 15 oz) 76 kg (167 lb 8.8 oz) 74.6 kg (164 lb 7.4 oz)    Examination:  General: No acute distress. Cardiovascular: Heart sounds show Tara Hurst regular rate, and rhythm. No gallops or rubs. No murmurs. No JVD. Lungs: Clear to auscultation bilaterally with good air movement. No rales, rhonchi or wheezes. Abdomen: Soft, nontender, nondistended with normal active bowel sounds. No masses. No hepatosplenomegaly. Neurological: Alert and oriented 3. Moves all extremities 4 with equal strength. Cranial nerves II through XII grossly intact. Skin: Warm and dry. No rashes or lesions. Extremities: No clubbing or cyanosis. No edema. Pedal pulses 2+. Psychiatric: Mood and affect are normal. Insight and judgment are appropriate.  Data Reviewed: I have personally reviewed following labs and imaging studies  CBC: Recent Labs  Lab 05/03/17 0709 05/04/17 0346 05/05/17 0319 05/06/17 0344 05/07/17 0517 05/08/17 0546  WBC 13.3* 12.8* 15.4* 13.5* 20.3* 13.6*  NEUTROABS 9.7*  --   --   --   --   --   HGB 10.9* 9.6* 10.0* 9.2* 10.2* 10.0*  HCT 31.9* 28.7* 31.1* 28.5* 30.9* 30.1*  MCV 80.8 82.0 83.4 82.1 82.6 82.0  PLT 233 210 212 211 213 099   Basic Metabolic Panel: Recent Labs  Lab 05/03/17 0709 05/04/17 0346  05/05/17 1351 05/05/17 1806 05/06/17 0344 05/07/17 0517 05/08/17 0546  NA 147* 149*   < > 153* 152* 149* 151* 143  K 3.6 3.3*   < > 4.0 4.0 3.6 4.1 3.5  CL 113* 120*   < > 121* 118* 117* 117* 111  CO2 23 24   < > 25 27 26 26 28   GLUCOSE 91 90   < > 104* 130* 97 93 99  BUN 15 12   < > 11 10 13 16 19   CREATININE 1.25* 1.33*   < > 1.22* 1.29* 1.32* 1.40* 1.28*  CALCIUM 10.1 9.3   <  > 10.3 10.2 9.8 10.1 9.9  MG 2.6* 2.1  --   --   --   --   --   --   PHOS  --  2.4*  --   --   --   --   --   --    < > = values in this interval not displayed.   GFR: Estimated Creatinine Clearance: 48.6 mL/min (Tara Hurst) (by C-G formula based on SCr of 1.28 mg/dL (H)). Liver Function Tests: Recent Labs  Lab 05/02/17 1918 05/03/17 0709 05/04/17 0346  AST 23 19 18   ALT 17 16 12*  ALKPHOS 107 92 73  BILITOT 0.9 0.5 0.4  PROT 8.8* 7.6 6.1*  ALBUMIN 5.0 4.4 3.5   No results for input(s): LIPASE, AMYLASE in the last 168 hours. Recent Labs  Lab 05/03/17 1449  AMMONIA 24   Coagulation Profile: No results for input(s): INR, PROTIME in the last 168 hours. Cardiac Enzymes: Recent Labs  Lab 05/03/17 0709  CKTOTAL 207   BNP (last 3 results) No results for input(s): PROBNP in the last 8760 hours. HbA1C: No results for input(s): HGBA1C in the last 72 hours. CBG: Recent Labs  Lab 05/02/17 1923  GLUCAP 148*   Lipid Profile: No results for input(s): CHOL, HDL, LDLCALC, TRIG, CHOLHDL, LDLDIRECT in the last 72 hours. Thyroid Function Tests: No results for input(s): TSH, T4TOTAL, FREET4, T3FREE, THYROIDAB in the last 72 hours. Anemia Panel: No results for input(s): VITAMINB12, FOLATE, FERRITIN, TIBC, IRON, RETICCTPCT in the last 72 hours. Sepsis Labs: Recent Labs  Lab 05/02/17 1936  LATICACIDVEN 1.84    Recent Results (from the past 240 hour(s))  MRSA PCR Screening     Status: None   Collection Time: 05/03/17  6:40 AM  Result Value Ref Range Status   MRSA by PCR NEGATIVE NEGATIVE Final    Comment:        The GeneXpert MRSA Assay (FDA approved for NASAL specimens only), is one component of [place] comprehensive MRSA colonization surveillance program. It is not intended to diagnose MRSA infection nor to guide or monitor treatment for MRSA infections.   Culture, Urine     Status: Abnormal   Collection Time: 05/03/17  3:00 PM  Result Value Ref Range Status   Specimen  Description URINE, CATHETERIZED  Final   Special Requests NONE  Final   Culture (Aviella Disbrow)  Final    <10,000 COLONIES/mL INSIGNIFICANT GROWTH Performed at [facility], [address], [place], [place] [address]    Report Status 05/04/2017 FINAL  Final  Culture, Urine     Status: Abnormal (Preliminary result)   Collection Time: 05/07/17 12:34 PM  Result Value Ref Range Status   Specimen Description URINE, RANDOM  Final   Special Requests NONE  Final   Culture >=100,000 COLONIES/mL GRAM NEGATIVE RODS (Jessly Lebeck)  Final   Report Status PENDING  Incomplete  Culture, blood (routine x 2)     Status: None (Preliminary result)   Collection Time: 05/07/17 12:47 PM  Result Value Ref Range Status   Specimen Description BLOOD LEFT HAND  Final   Special Requests   Final    BOTTLES DRAWN AEROBIC AND ANAEROBIC Blood Culture adequate volume   Culture   Final    NO GROWTH < 24 HOURS Performed at [facility], [place] [address], [place], [place] [address]    Report Status PENDING  Incomplete  Culture, blood (routine x 2)     Status: None (Preliminary result)   Collection Time: 05/07/17 12:57 PM  Result Value Ref Range Status   Specimen Description BLOOD RIGHT HAND  Final   Special Requests   Final    BOTTLES DRAWN AEROBIC AND ANAEROBIC Blood Culture adequate volume   Culture   Final    NO GROWTH < 24 HOURS Performed at [facility], [place] [address] [place], [place] [address]    Report Status PENDING  Incomplete         Radiology Studies: Dg Chest 2 View  Result Date: 05/08/2017 CLINICAL DATA:  Fever, acute renal failure,, hypertension, nonsmoker. EXAM: CHEST  2 VIEW COMPARISON:  Portable chest x-ray of May 07, 2017 and May 02, 2017.  FINDINGS: The lungs are reasonably well inflated. The right hemidiaphragm appears higher than the left. There are small bilateral pleural effusions layering posteriorly. There is no alveolar infiltrate. The heart and pulmonary vascularity are normal.  The mediastinum is normal in width. There is multilevel degenerative disc disease of the thoracic spine. IMPRESSION: Small bilateral pleural effusions layering posteriorly and laterally. No definite pneumonia nor pulmonary edema. Electronically Signed   By: David  Martinique M.D.   On: 05/08/2017 09:12   Dg Chest Port 1 View  Result Date: 05/07/2017 CLINICAL DATA:  Hypertension.  Fever. EXAM: PORTABLE CHEST 1 VIEW COMPARISON:  05/02/2017 FINDINGS: Heart size is normal. Mediastinal shadows are normal. There is patchy density at both lung bases that could be atelectasis or mild pneumonia. No dense consolidation or lobar collapse. No abnormal bone finding. IMPRESSION: Development of patchy density at both lung bases right more than left that could be atelectasis or mild pneumonia. Electronically Signed   By: Nelson Chimes M.D.   On: 05/07/2017 13:48        Scheduled Meds: . benztropine  0.5 mg Oral BID  . [START ON 05/09/2017] buPROPion  300 mg Oral Daily  . enoxaparin (LOVENOX) injection  40 mg Subcutaneous Q24H  . folic acid  1 mg Oral Daily  . LORazepam  1 mg Oral q morning - 10a  . LORazepam  2 mg Oral QHS  . multivitamin with minerals  1 tablet Oral Daily  . risperiDONE  2 mg Oral BID  . thiamine  100 mg Oral Daily   Or  . thiamine  100 mg Intravenous Daily   Continuous Infusions: . dextrose 125 mL/hr at 05/08/17 1322     LOS: 5 days    Time spent: over 20 minutes    Fayrene Helper, MD [facility] Pager [phone]   If 7PM-7AM, please contact night-coverage [email or web address] Password [email or web address] 05/08/2017, 3:09 PM

## 2017-05-08 NOTE — Progress Notes (Signed)
Physical Therapy Treatment Patient Details Name: Tara Hurst MRN: [id number] DOB: July 07, 1960 Today's Date: 05/08/2017    History of Present Illness 56 yo female admitted with acute encephalopathy, tremors, rigidity, benzo withdrawal. Hx of schizoaffective d/o, HTn, lithium use    PT Comments    Pt participated well. Unable to ambulate very much due to pt's IV pole being attached to the bed. Worked on walking on different surfaces, negotiating different surfaces along side of bed. Discussed d/c plan-pt plans to return home. Will continue to follow.    Follow Up Recommendations  No PT follow up;Supervision - Intermittent(home safety evaluation)     Equipment Recommendations  None recommended by PT    Recommendations for Other Services       Precautions / Restrictions Precautions Precautions: Fall Restrictions Weight Bearing Restrictions: No    Mobility  Bed Mobility Overal bed mobility: Modified Independent                Transfers Overall transfer level: Modified independent                  Ambulation/Gait Ambulation/Gait assistance: Min guard Ambulation Distance (Feet): 10 Feet         General Gait Details: only able to ambulate near bed. Pt's IV pole is attached to bed. Challanged balalnce by having pt walk back and forth on along side of bed (onto and off of floor pad)   Stairs            Wheelchair Mobility    Modified Rankin (Stroke Patients Only)       Balance                                            Cognition Arousal/Alertness: Awake/alert Behavior During Therapy: WFL for tasks assessed/performed Overall Cognitive Status: History of cognitive impairments - at baseline                                        Exercises      General Comments        Pertinent Vitals/Pain Pain Assessment: No/denies pain    Home Living                      Prior Function            PT  Goals (current goals can now be found in the care plan section) Progress towards PT goals: Progressing toward goals    Frequency    Min 3X/week      PT Plan Current plan remains appropriate    Co-evaluation              AM-PAC PT "6 Clicks" Daily Activity  Outcome Measure  Difficulty turning over in bed (including adjusting bedclothes, sheets and blankets)?: None Difficulty moving from lying on back to sitting on the side of the bed? : None Difficulty sitting down on and standing up from a chair with arms (e.g., wheelchair, bedside commode, etc,.)?: None Help needed moving to and from a bed to chair (including a wheelchair)?: A Little Help needed walking in hospital room?: A Little Help needed climbing 3-5 steps with a railing? : A Little 6 Click Score: 21    End of Session Equipment Utilized During Treatment:  Gait belt Activity Tolerance: Patient tolerated treatment well Patient left: in bed;with call bell/phone within reach;with bed alarm set   PT Visit Diagnosis: Muscle weakness (generalized) (M62.81);Difficulty in walking, not elsewhere classified (R26.2)     Time: 9923-4144 PT Time Calculation (min) (ACUTE ONLY): 15 min  Charges:  $Gait Training: 8-22 mins                    G Codes:          Weston Anna, MPT Pager: [phone]

## 2017-05-08 NOTE — Clinical Social Work Note (Signed)
Clinical Social Work Assessment  Patient Details  Name: Tara Hurst MRN: [id number] Date of Birth: 11-26-1960  Date of referral:  05/08/17               Reason for consult:  Mental Health Concerns                Permission sought to share information with:    Permission granted to share information::     Name::        Agency::     Relationship::     Contact Information:     Housing/Transportation Living arrangements for the past 2 months:  Single Family Home Source of Information:  Patient, Medical Team, Psychiatric Consultation Patient Interpreter Needed:  None Criminal Activity/Legal Involvement Pertinent to Current Situation/Hospitalization:  No - Comment as needed Significant Relationships:  Friend Lives with:    Do you feel safe going back to the place where you live?  Yes Need for family participation in patient care:  Yes (Comment)  Care giving concerns:  No care giving concerns at the time of assessment.   Social Worker assessment / plan:  LCSW consulted for outpatient psych resources.  Patient was admitted to the hospital for Acute encephalopathy.  Patient is 56 year old female with history of schizoaffective disorder, hypertension was brought to the ER by EMS after patient called EMS patient was not feeling well.    LCSW met with patient at bedside. No family at bedside during assessment.   Patient reports that she was previously seen at [facility], but was discharged some time ago. Patient could not recall how long it has been since she was seen by the Psychiatrist at [facility].   Patient reports that she lives alone and is independent with her ADLs. Patient reports that she has transportation through [facility] to get to appointments and pick up medications.   LCSW discussed [facility] and other outpatient psychiatric resources. LCSW gave patient a list of resourcees.    PLAN: Patient will follow up with outpatient psychiatric resources.   Employment status:  Disabled  (Comment on whether or not currently receiving Disability) Insurance information:  Medicaid In [place] PT Recommendations:  No Follow Up Information / Referral to community resources:  Outpatient Psychiatric Care (Comment Required)(Per request of Psychatrist, LCSW gave patient outpatient resources.)  Patient/Family's Response to care:  Patient is responsive to care.  Patient/Family's Understanding of and Emotional Response to Diagnosis, Current Treatment, and Prognosis:  Patient appears to have a clear understanding of her diagnosis and is agreeable to current treatment plan.   Emotional Assessment Appearance:  Appears older than stated age Attitude/Demeanor/Rapport:    Affect (typically observed):  Accepting, Calm, Pleasant Orientation:  Oriented to Self, Oriented to Place, Oriented to  Time, Oriented to Situation Alcohol / Substance use:  Not Applicable Psych involvement (Current and /or in the community):  Yes (Comment)(Patient states that she has not been seen by psych in awhile. )  Discharge Needs  Concerns to be addressed:  Mental Health Concerns Readmission within the last 30 days:  No Current discharge risk:  None Barriers to Discharge:  Continued Medical Work up   [employer], LCSW 05/08/2017, 12:51 PM

## 2017-05-08 NOTE — Progress Notes (Signed)
Date: May 08, 2017 Velva Harman, BSN, [place], [place] Chart and notes review for patient progress and needs. Will follow for case management and discharge needs. Next review date: [id number]

## 2017-05-09 DIAGNOSIS — E870 Hyperosmolality and hypernatremia: Secondary | ICD-10-CM

## 2017-05-09 LAB — CBC
HEMATOCRIT: 30.5 % — AB (ref 36.0–46.0)
Hemoglobin: 10.1 g/dL — ABNORMAL LOW (ref 12.0–15.0)
MCH: 27.3 pg (ref 26.0–34.0)
MCHC: 33.1 g/dL (ref 30.0–36.0)
MCV: 82.4 fL (ref 78.0–100.0)
Platelets: 224 10*3/uL (ref 150–400)
RBC: 3.7 MIL/uL — ABNORMAL LOW (ref 3.87–5.11)
RDW: 13.4 % (ref 11.5–15.5)
WBC: 13.2 10*3/uL — ABNORMAL HIGH (ref 4.0–10.5)

## 2017-05-09 LAB — BASIC METABOLIC PANEL
Anion gap: 7 (ref 5–15)
Anion gap: 8 (ref 5–15)
BUN: 23 mg/dL — AB (ref 6–20)
BUN: 21 mg/dL — AB (ref 6–20)
CALCIUM: 10.2 mg/dL (ref 8.9–10.3)
CALCIUM: 10.1 mg/dL (ref 8.9–10.3)
CO2: 28 mmol/L (ref 22–32)
CO2: 29 mmol/L (ref 22–32)
CREATININE: 1.37 mg/dL — AB (ref 0.44–1.00)
CREATININE: 1.34 mg/dL — AB (ref 0.44–1.00)
Chloride: 112 mmol/L — ABNORMAL HIGH (ref 101–111)
Chloride: 107 mmol/L (ref 101–111)
GFR calc Af Amer: 49 mL/min — ABNORMAL LOW (ref >60)
GFR calc non Af Amer: 43 mL/min — ABNORMAL LOW (ref >60)
GFR, EST AFRICAN AMERICAN: 50 mL/min — AB (ref >60)
GFR, EST NON AFRICAN AMERICAN: 42 mL/min — AB (ref >60)
GLUCOSE: 93 mg/dL (ref 65–99)
Glucose, Bld: 102 mg/dL — ABNORMAL HIGH (ref 65–99)
POTASSIUM: 4 mmol/L (ref 3.5–5.1)
Potassium: 3.8 mmol/L (ref 3.5–5.1)
SODIUM: 144 mmol/L (ref 135–145)
Sodium: 147 mmol/L — ABNORMAL HIGH (ref 135–145)

## 2017-05-09 LAB — URINE CULTURE

## 2017-05-09 MED ORDER — DEXTROSE 5 % IV SOLN: INTRAVENOUS | Status: DC

## 2017-05-09 MED ORDER — CEPHALEXIN 500 MG PO CAPS
500.0000 mg | ORAL_CAPSULE | Freq: Two times a day (BID) | ORAL | Status: DC
  Administered 2017-05-09 – 2017-05-10 (×3): 500 mg via ORAL
  Filled 2017-05-09 (×3): qty 1

## 2017-05-09 MED ORDER — DEXTROSE 5 % IV SOLN
INTRAVENOUS | Status: DC
  Administered 2017-05-09: 11:00:00 via INTRAVENOUS

## 2017-05-09 NOTE — Progress Notes (Signed)
PROGRESS NOTE    Tara Hurst  IRS:[id number] DOB: [birth year] DOA: 05/02/2017 PCP: Patient, No Pcp Per   Brief Narrative:  Tara Hurst a 56 y.o.femalewithhistory of schizoaffective disorder, hypertension was brought to the ER by EMS after patient called EMS patient was not feeling well. Patient states that over the last few days she has not taken her medications since she thought it would help her. Denies any chest pain shortness of breath nausea vomiting diarrhea headache or visual symptoms.      Assessment & Plan:   Principal Problem:   Acute encephalopathy Active Problems:   Schizoaffective disorder ([place])   Hypertension   ARF (acute renal failure) (HCC)   Benzodiazepine withdrawal ([facility])  #1 acute encephalopathy with tremors Improved after treatment with benzodiazepine withdrawal.  Initially there were concerns for neuroleptic malignant syndrome versus serotonin syndrome given patient's symptoms.  Patient improved on scheduled Ativan and CIWA protocol.  Patient being seen in consultation by psychiatry and neurology.  It was felt per neurology the patient is rigidity and tremors may have been related to discontinuation of patient's Cogentin.  Total CK has been normal.  TSH normal.  Ammonia level is normal.  Head CT negative.  Chest x-ray negative.  Continue Cogentin, scheduled Ativan, Wellbutrin.  #2 hypernatremia  Questionable etiology.  Likely secondary to increased urine output and patient not been able to keep up with all oral intake.  Could also likely be secondary to possible diabetes insipidus due to history of lithium use.  Concern for also postobstructive diuresis as patient was noted to have some urinary retention with over 900 cc retained.  Hypernatremia initially improved however went back up today to 147.  Change IV fluids to D5W.  Check a basic metabolic profile this afternoon.  Continue to encourage oral free water intake.  Follow.  3.  Complicated cystic  mass Noted on imaging.  Outpatient follow-up.  4.  Klebsiella pneumonia UTI Patient noted to have a fever and a leukocytosis which has since improved.  Urinalysis was worrisome for UTI.  Urine cultures with greater than 100,000 Klebsiella pneumonia.  Patient was placed empirically on IV Rocephin.  We will transition from IV Rocephin to oral Keflex to complete a course of antibiotic treatment.  Follow.  5.  History of schizoaffective disorder Patient has been seen by psychiatry and medications restarted.  Patient currently on benztropine 0.5 mg twice daily, Wellbutrin 300 mg daily, risperidone 2 mg twice daily.  Ativan 1 mg in the morning and 2 mg at bedtime.  Patient will follow up in the outpatient psychiatric setting.  6.  Acute kidney injury Felt secondary to dehydration.  Currently close to baseline.  Follow.  7.  Urinary retention Patient  noted to have large amounts of urine output despite bladder scan noting that patient was retaining.  Patient with a urine output of 4.2 L over the past 24 hours.  Follow.  #8 anemia Patient with no overt bleeding.  Anemia panel with a iron level of 35, TIBC of 255, ferritin of 210, folate of 22.4.  Follow H&H.  Outpatient follow-up.     DVT prophylaxis: Lovenox Code Status: Full Family Communication: Updated patient.  No family at bedside. Disposition Plan: Likely home once hyponatremia has improved and clinical improvement.  Hopefully in the next 24-48 hours.   Consultants:   Psychiatry: Dr Modesta Messing 05/05/2017  Neurology: Dr.Aroor 05/03/2017  Procedures:   CT head without contrast 05/02/2017  Chest x-ray 05/02/2017, 05/07/2017, 05/08/2017  Renal ultrasound  05/05/2017  Antimicrobials:   Oral Keflex 05/09/2017  IV Rocephin 05/08/2017>>>> 05/09/2017   Subjective: Patient states had some abdominal pain this morning however relieved after she had a bowel movement.  No chest pain.  No shortness of breath.  Tolerating oral  intake.  Objective: Vitals:   05/08/17 0525 05/08/17 1400 05/08/17 2002 05/09/17 0543  BP: 118/82 108/81 115/63 113/76  Pulse: 88 94 89 87  Resp: 20  20 19   Temp: 98.7 F (37.1 C) 97.8 F (36.6 C) 98.6 F (37 C) 99.1 F (37.3 C)  TempSrc: Oral Oral Oral Oral  SpO2: 100% 100% 100% 99%  Weight: 74.6 kg (164 lb 7.4 oz)     Height:        Intake/Output Summary (Last 24 hours) at 05/09/2017 1323 Last data filed at 05/08/2017 2121 Gross per 24 hour  Intake 1665 ml  Output 1750 ml  Net -85 ml   Filed Weights   05/06/17 1544 05/07/17 0626 05/08/17 0525  Weight: 73 kg (160 lb 15 oz) 76 kg (167 lb 8.8 oz) 74.6 kg (164 lb 7.4 oz)    Examination:  General exam: Appears calm and comfortable  Respiratory system: Clear to auscultation. Respiratory effort normal. Cardiovascular system: S1 & S2 heard, RRR. No JVD, murmurs, rubs, gallops or clicks. No pedal edema. Gastrointestinal system: Abdomen is nondistended, soft and nontender. No organomegaly or masses felt. Normal bowel sounds heard. Central nervous system: Alert and oriented. No focal neurological deficits. Extremities: Symmetric 5 x 5 power. Skin: No rashes, lesions or ulcers Psychiatry: Judgement and insight appear normal. Mood & affect appropriate.     Data Reviewed: I have personally reviewed following labs and imaging studies  CBC: Recent Labs  Lab 05/03/17 0709  05/05/17 0319 05/06/17 0344 05/07/17 0517 05/08/17 0546 05/09/17 0644  WBC 13.3*   < > 15.4* 13.5* 20.3* 13.6* 13.2*  NEUTROABS 9.7*  --   --   --   --   --   --   HGB 10.9*   < > 10.0* 9.2* 10.2* 10.0* 10.1*  HCT 31.9*   < > 31.1* 28.5* 30.9* 30.1* 30.5*  MCV 80.8   < > 83.4 82.1 82.6 82.0 82.4  PLT 233   < > 212 211 213 197 224   < > = values in this interval not displayed.   Basic Metabolic Panel: Recent Labs  Lab 05/03/17 0709 05/04/17 0346  05/05/17 1806 05/06/17 0344 05/07/17 0517 05/08/17 0546 05/09/17 0644  NA 147* 149*   < > 152*  149* 151* 143 147*  K 3.6 3.3*   < > 4.0 3.6 4.1 3.5 4.0  CL 113* 120*   < > 118* 117* 117* 111 112*  CO2 23 24   < > 27 26 26 28 28   GLUCOSE 91 90   < > 130* 97 93 99 93  BUN 15 12   < > 10 13 16 19  23*  CREATININE 1.25* 1.33*   < > 1.29* 1.32* 1.40* 1.28* 1.37*  CALCIUM 10.1 9.3   < > 10.2 9.8 10.1 9.9 10.2  MG 2.6* 2.1  --   --   --   --   --   --   PHOS  --  2.4*  --   --   --   --   --   --    < > = values in this interval not displayed.   GFR: Estimated Creatinine Clearance: 45.4 mL/min (A) (by C-G formula based  on SCr of 1.37 mg/dL (H)). Liver Function Tests: Recent Labs  Lab 05/02/17 1918 05/03/17 0709 05/04/17 0346  AST 23 19 18   ALT 17 16 12*  ALKPHOS 107 92 73  BILITOT 0.9 0.5 0.4  PROT 8.8* 7.6 6.1*  ALBUMIN 5.0 4.4 3.5   No results for input(s): LIPASE, AMYLASE in the last 168 hours. Recent Labs  Lab 05/03/17 1449  AMMONIA 24   Coagulation Profile: No results for input(s): INR, PROTIME in the last 168 hours. Cardiac Enzymes: Recent Labs  Lab 05/03/17 0709  CKTOTAL 207   BNP (last 3 results) No results for input(s): PROBNP in the last 8760 hours. HbA1C: No results for input(s): HGBA1C in the last 72 hours. CBG: Recent Labs  Lab 05/02/17 1923  GLUCAP 148*   Lipid Profile: No results for input(s): CHOL, HDL, LDLCALC, TRIG, CHOLHDL, LDLDIRECT in the last 72 hours. Thyroid Function Tests: No results for input(s): TSH, T4TOTAL, FREET4, T3FREE, THYROIDAB in the last 72 hours. Anemia Panel: No results for input(s): VITAMINB12, FOLATE, FERRITIN, TIBC, IRON, RETICCTPCT in the last 72 hours. Sepsis Labs: Recent Labs  Lab 05/02/17 1936  LATICACIDVEN 1.84    Recent Results (from the past 240 hour(s))  MRSA PCR Screening     Status: None   Collection Time: 05/03/17  6:40 AM  Result Value Ref Range Status   MRSA by PCR NEGATIVE NEGATIVE Final    Comment:        The GeneXpert MRSA Assay (FDA approved for NASAL specimens only), is one component of  a comprehensive MRSA colonization surveillance program. It is not intended to diagnose MRSA infection nor to guide or monitor treatment for MRSA infections.   Culture, Urine     Status: Abnormal   Collection Time: 05/03/17  3:00 PM  Result Value Ref Range Status   Specimen Description URINE, CATHETERIZED  Final   Special Requests NONE  Final   Culture (A)  Final    <10,000 COLONIES/mL INSIGNIFICANT GROWTH Performed at [facility], [address], [place], [place] [address]    Report Status 05/04/2017 FINAL  Final  Culture, Urine     Status: Abnormal   Collection Time: 05/07/17 12:34 PM  Result Value Ref Range Status   Specimen Description URINE, RANDOM  Final   Special Requests NONE  Final   Culture >=100,000 COLONIES/mL KLEBSIELLA PNEUMONIAE (A)  Final   Report Status 05/09/2017 FINAL  Final   Organism ID, Bacteria KLEBSIELLA PNEUMONIAE (A)  Final      Susceptibility   Klebsiella pneumoniae - MIC*    AMPICILLIN RESISTANT Resistant     CEFAZOLIN <=4 SENSITIVE Sensitive     CEFTRIAXONE <=1 SENSITIVE Sensitive     CIPROFLOXACIN <=0.25 SENSITIVE Sensitive     GENTAMICIN <=1 SENSITIVE Sensitive     IMIPENEM <=0.25 SENSITIVE Sensitive     NITROFURANTOIN 64 INTERMEDIATE Intermediate     TRIMETH/SULFA <=20 SENSITIVE Sensitive     AMPICILLIN/SULBACTAM 4 SENSITIVE Sensitive     PIP/TAZO <=4 SENSITIVE Sensitive     Extended ESBL NEGATIVE Sensitive     * >=100,000 COLONIES/mL KLEBSIELLA PNEUMONIAE  Culture, blood (routine x 2)     Status: None (Preliminary result)   Collection Time: 05/07/17 12:47 PM  Result Value Ref Range Status   Specimen Description BLOOD LEFT HAND  Final   Special Requests   Final    BOTTLES DRAWN AEROBIC AND ANAEROBIC Blood Culture adequate volume   Culture   Final    NO  GROWTH 2 DAYS Performed at [facility], [place] [address] [place], [place] [address]    Report Status PENDING  Incomplete  Culture, blood (routine x 2)     Status: None  (Preliminary result)   Collection Time: 05/07/17 12:57 PM  Result Value Ref Range Status   Specimen Description BLOOD RIGHT HAND  Final   Special Requests   Final    BOTTLES DRAWN AEROBIC AND ANAEROBIC Blood Culture adequate volume   Culture   Final    NO GROWTH 2 DAYS Performed at [facility], [place] [address], [place], [place] [address]    Report Status PENDING  Incomplete         Radiology Studies: Dg Chest 2 View  Result Date: 05/08/2017 CLINICAL DATA:  Fever, acute renal failure,, hypertension, nonsmoker. EXAM: CHEST  2 VIEW COMPARISON:  Portable chest x-ray of May 07, 2017 and May 02, 2017. FINDINGS: The lungs are reasonably well inflated. The right hemidiaphragm appears higher than the left. There are small bilateral pleural effusions layering posteriorly. There is no alveolar infiltrate. The heart and pulmonary vascularity are normal. The mediastinum is normal in width. There is multilevel degenerative disc disease of the thoracic spine. IMPRESSION: Small bilateral pleural effusions layering posteriorly and laterally. No definite pneumonia nor pulmonary edema. Electronically Signed   By: David  Martinique M.D.   On: 05/08/2017 09:12   Dg Chest Port 1 View  Result Date: 05/07/2017 CLINICAL DATA:  Hypertension.  Fever. EXAM: PORTABLE CHEST 1 VIEW COMPARISON:  05/02/2017 FINDINGS: Heart size is normal. Mediastinal shadows are normal. There is patchy density at both lung bases that could be atelectasis or mild pneumonia. No dense consolidation or lobar collapse. No abnormal bone finding. IMPRESSION: Development of patchy density at both lung bases right more than left that could be atelectasis or mild pneumonia. Electronically Signed   By: Nelson Chimes M.D.   On: 05/07/2017 13:48        Scheduled Meds: . benztropine  0.5 mg Oral BID  . buPROPion  300 mg Oral Daily  . cephALEXin  500 mg Oral Q12H  . enoxaparin (LOVENOX) injection  40 mg Subcutaneous Q24H  . folic  acid  1 mg Oral Daily  . LORazepam  1 mg Oral q morning - 10a  . LORazepam  2 mg Oral QHS  . multivitamin with minerals  1 tablet Oral Daily  . risperiDONE  2 mg Oral BID  . thiamine  100 mg Oral Daily   Or  . thiamine  100 mg Intravenous Daily   Continuous Infusions: . dextrose 100 mL/hr at 05/09/17 1039     LOS: 6 days    Time spent: 23 mins    Kayron Hicklin, MD [facility] Pager [phone]  If 7PM-7AM, please contact night-coverage [email or web address] Password [email or web address] 05/09/2017, 1:23 PM

## 2017-05-10 DIAGNOSIS — F250 Schizoaffective disorder, bipolar type: Secondary | ICD-10-CM

## 2017-05-10 DIAGNOSIS — N390 Urinary tract infection, site not specified: Secondary | ICD-10-CM

## 2017-05-10 DIAGNOSIS — E870 Hyperosmolality and hypernatremia: Secondary | ICD-10-CM

## 2017-05-10 DIAGNOSIS — B961 Klebsiella pneumoniae [K. pneumoniae] as the cause of diseases classified elsewhere: Secondary | ICD-10-CM

## 2017-05-10 LAB — BASIC METABOLIC PANEL
Anion gap: 6 (ref 5–15)
BUN: 18 mg/dL (ref 6–20)
CALCIUM: 9.8 mg/dL (ref 8.9–10.3)
CO2: 28 mmol/L (ref 22–32)
CREATININE: 1.33 mg/dL — AB (ref 0.44–1.00)
Chloride: 107 mmol/L (ref 101–111)
GFR calc non Af Amer: 44 mL/min — ABNORMAL LOW (ref >60)
GFR, EST AFRICAN AMERICAN: 51 mL/min — AB (ref >60)
Glucose, Bld: 93 mg/dL (ref 65–99)
Potassium: 3.6 mmol/L (ref 3.5–5.1)
SODIUM: 141 mmol/L (ref 135–145)

## 2017-05-10 LAB — CBC
HCT: 30.4 % — ABNORMAL LOW (ref 36.0–46.0)
Hemoglobin: 9.9 g/dL — ABNORMAL LOW (ref 12.0–15.0)
MCH: 26.8 pg (ref 26.0–34.0)
MCHC: 32.6 g/dL (ref 30.0–36.0)
MCV: 82.2 fL (ref 78.0–100.0)
PLATELETS: 244 10*3/uL (ref 150–400)
RBC: 3.7 MIL/uL — AB (ref 3.87–5.11)
RDW: 13.3 % (ref 11.5–15.5)
WBC: 12.5 10*3/uL — AB (ref 4.0–10.5)

## 2017-05-10 MED ORDER — FOLIC ACID 1 MG PO TABS: 1.0000 mg | ORAL_TABLET | Freq: Every day | ORAL | Status: DC

## 2017-05-10 MED ORDER — ADULT MULTIVITAMIN W/MINERALS CH: 1.0000 | ORAL_TABLET | Freq: Every day | ORAL | Status: DC

## 2017-05-10 MED ORDER — CEPHALEXIN 500 MG PO CAPS: 500.0000 mg | ORAL_CAPSULE | Freq: Two times a day (BID) | ORAL | 0 refills | Status: AC

## 2017-05-10 MED ORDER — THIAMINE HCL 100 MG PO TABS: 100.0000 mg | ORAL_TABLET | Freq: Every day | ORAL | Status: DC

## 2017-05-10 NOTE — Progress Notes (Addendum)
[id number] Maisley Hainsworth,BSN,RN3,CCM/KAREN WITH [facility] NOTIFIED OF NEED FOR RN SAFETY EVAL ONCE PATIENT IS HOME BEING DCD TODAY.  Per Santiago Glad with [facility] medicaid will not pay for rn. MD notified.

## 2017-05-10 NOTE — Discharge Summary (Signed)
Physician Discharge Summary  Tara Hurst WNI:[id number] DOB: 1961-04-20 DOA: 05/02/2017  PCP: Tara Liberty, MD  Admit date: 05/02/2017 Discharge date: 05/10/2017  Time spent: 65 minutes  Recommendations for Outpatient Follow-up:  1. Follow-up with [facility] in the outpatient setting for further management of schizoaffective disorder. 2. Follow-up with Tara Liberty, MD in 1-2 weeks.  On follow-up patient will need a basic metabolic profile done to follow-up on electrolytes and renal function.  Patient's blood pressure need to be reassessed as patient's antihypertensive medications were discontinued on discharge.  Complicated cystic mass noted on renal ultrasound will need to be followed up upon with PCP or patient may need a referral to nephrology for further evaluation in the outpatient setting.   Discharge Diagnoses:  Principal Problem:   Acute encephalopathy Active Problems:   Schizoaffective disorder ([place])   Hypertension   ARF (acute renal failure) (HCC)   Benzodiazepine withdrawal (HCC)   Hypernatremia   UTI due to Klebsiella species   Discharge Condition: Stable and improved  Diet recommendation: Regular  Filed Weights   05/07/17 0626 05/08/17 0525 05/10/17 0508  Weight: 76 kg (167 lb 8.8 oz) 74.6 kg (164 lb 7.4 oz) 72.2 kg (159 lb 2.8 oz)    History of present illness:  Per Tara Hurst is a 56 y.o. female with history of schizoaffective disorder, hypertension was brought to the ER by EMS after patient called EMS patient was not feeling well.  Patient stated that over the last few days she had not taken her medications since she thought it wouldn't help her.  Denied any chest pain shortness of breath nausea vomiting diarrhea headache or visual symptoms.   ED Course:  In the ER patient was found to be tremulous tachycardic and had some difficulty to bring out words.  Patient was also confused.  UA urine drug screen chest x-ray all unremarkable.   Patient was not able to give a clear history stating only that she had not taken her medications but not able to say how long she did not take it. In the ER patient was given a dose of Cogentin and Risperdal despite which patient remained tachycardic.  Patient was given 1 dose of Haldol 5 mg IV.  Despite all these, patient continued to remain tachycardic and confused.  Patient was afebrile.  Was oriented to her name and place.  Followed commands and moved all extremities.  Patient was admitted for acute encephalopathy with tremors   Hospital Course:  #1 acute encephalopathy with tremors Improved after treatment with benzodiazepine withdrawal.  Initially there were concerns for neuroleptic malignant syndrome versus serotonin syndrome given patient's symptoms.  Patient improved on scheduled Ativan and CIWA protocol.  Patient was seen in consultation by psychiatry and neurology.  It was felt per neurology that patient's rigidity and tremors may have been related to discontinuation of patient's Cogentin.  Total CK has been normal.  TSH normal.  Ammonia level was normal.  Head CT negative.  Chest x-ray negative.    Patient was resumed back on Cogentin, scheduled Ativan, Wellbutrin per psychiatric recommendations and remained in stable condition.  Patient will follow-up with [facility] in the outpatient setting.    #2 hypernatremia  Questionable etiology.  Likely secondary to increased urine output and patient not been able to keep up with all oral intake.  Could also likely be secondary to possible diabetes insipidus due to prior history of lithium use.  Concern for also postobstructive diuresis as patient was noted to  have some urinary retention with over 900 cc retained.  Hypernatremia initially improved however went back up today to 147.  Patient was subsequently placed on D5W and follow.  Hyponatremia improved and had resolved by day of discharge.  Was also noted that patient was on losartan-HCTZ which was held  during the hospitalization and has been discontinued.  Outpatient follow-up with PCP.  3.  Complicated cystic mass Noted on imaging.  Outpatient follow-up.  4.  Klebsiella pneumonia UTI Patient noted to have a fever and a leukocytosis hospitalization and subsequently urinalysis was done.  Urinalysis had large leukocytes, nitrite positive, 6-30 WBCs.  She was placed empirically on IV Rocephin while urine cultures were pending.  Urine cultures with greater than 100,000 Klebsiella pneumonia.  Patient subsequently transitioned to oral Keflex and will be discharged on 5 days of oral Keflex to complete a one-week course of antibiotic treatment.  Outpatient follow-up.   5.  History of schizoaffective disorder Patient has been seen by psychiatry and medications restarted.  Patient was resumed back on home regimen of benztropine 0.5 mg twice daily, Wellbutrin 300 mg daily, risperidone 2 mg twice daily.  Ativan 1 mg in the morning and 2 mg at bedtime.  Patient did not have any delusions and remained in stable condition.  Patient will follow-up with [facility] in the outpatient setting.   6.  Acute kidney injury Felt secondary to dehydration.    Patient was hydrated with IV fluids and by day of discharge creatinine was down to 1.33 which was close to her baseline.  Patient's ARB and diuretic were held and will not be resumed on discharge as patient's systolic blood pressure remained in the 110s.  Outpatient follow-up.    7.  Urinary retention Patient  noted to have large amounts of urine output despite bladder scan noting that patient was retaining.  Patient with a urine output of 4.2 L on 05/08/2017.  Patient also had an output of 2.3 L on 05/09/2017.  Patient remained asymptomatic.  Outpatient follow-up.    #8 anemia Patient with no overt bleeding.  Anemia panel with a iron level of 35, TIBC of 255, ferritin of 210, folate of 22.4.    Hemoglobin remained stable.  Outpatient follow-up.         Procedures:  CT head without contrast 05/02/2017  Chest x-ray 05/02/2017, 05/07/2017, 05/08/2017  Renal ultrasound 05/05/2017      Consultations:  Psychiatry: Tara Hurst 05/05/2017  Neurology: Tara.Aroor 05/03/2017      Discharge Exam: Vitals:   05/10/17 0508 05/10/17 1413  BP: (!) 106/91 116/81  Pulse: 79 83  Resp: 20 16  Temp: 98.4 F (36.9 C) 98.5 F (36.9 C)  SpO2: 100% 99%    General: NAD Cardiovascular: RRR Respiratory: CTAB  Discharge Instructions   Discharge Instructions    Diet general   Complete by:  As directed    Increase activity slowly   Complete by:  As directed      Current Discharge Medication List    START taking these medications   Details  cephALEXin (KEFLEX) 500 MG capsule Take 1 capsule (500 mg total) every 12 (twelve) hours for 5 days by mouth. Qty: 10 capsule, Refills: 0    folic acid (FOLVITE) 1 MG tablet Take 1 tablet (1 mg total) daily by mouth.    Multiple Vitamin (MULTIVITAMIN WITH MINERALS) TABS tablet Take 1 tablet daily by mouth.    thiamine 100 MG tablet Take 1 tablet (100 mg total) daily by mouth.  CONTINUE these medications which have NOT CHANGED   Details  atorvastatin (LIPITOR) 20 MG tablet Take 20 mg daily by mouth.    benztropine (COGENTIN) 0.5 MG tablet Take 1 tablet (0.5 mg total) by mouth 2 (two) times daily. Qty: 60 tablet, Refills: 2    buPROPion (WELLBUTRIN XL) 300 MG 24 hr tablet Take 300 mg daily by mouth.    diphenhydrAMINE (BENADRYL) 25 mg capsule Take 1 capsule (25 mg total) by mouth 2 (two) times daily as needed for sleep (Extrapyramidal symptoms). Qty: 30 capsule, Refills: 0    hydrOXYzine (VISTARIL) 50 MG capsule Take 50 mg by mouth at bedtime.    LORazepam (ATIVAN) 1 MG tablet Take 1-2 mg 2 (two) times daily by mouth. Take 1mg  by mouth in the morning and take 2mg  by mouth at bedtime    risperiDONE (RISPERDAL) 3 MG tablet Take 3 mg 2 (two) times daily by mouth.      STOP  taking these medications     losartan-hydrochlorothiazide (HYZAAR) 50-12.5 MG tablet        No Known Allergies Follow-up Information    [facility]. Schedule an appointment as soon as possible for a visit.   Specialty:  [facility] information: [place] [id number] [phone]        Tara Liberty, MD. Schedule an appointment as soon as possible for a visit in 1 week(s).   Specialty:  Pulmonary Disease Why:  f/u in 1-2 weeks. Contact information: [place] [address] [phone]            The results of significant diagnostics from this hospitalization (including imaging, microbiology, ancillary and laboratory) are listed below for reference.    Significant Diagnostic Studies: Dg Chest 2 View  Result Date: 05/08/2017 CLINICAL DATA:  Fever, acute renal failure,, hypertension, nonsmoker. EXAM: CHEST  2 VIEW COMPARISON:  Portable chest x-ray of May 07, 2017 and May 02, 2017. FINDINGS: The lungs are reasonably well inflated. The right hemidiaphragm appears higher than the left. There are small bilateral pleural effusions layering posteriorly. There is no alveolar infiltrate. The heart and pulmonary vascularity are normal. The mediastinum is normal in width. There is multilevel degenerative disc disease of the thoracic spine. IMPRESSION: Small bilateral pleural effusions layering posteriorly and laterally. No definite pneumonia nor pulmonary edema. Electronically Signed   By: David  Martinique M.D.   On: 05/08/2017 09:12   Ct Head Wo Contrast  Result Date: 05/02/2017 CLINICAL DATA:  Confusion and difficulty speaking. EXAM: CT HEAD WITHOUT CONTRAST TECHNIQUE: Contiguous axial images were obtained from the base of the skull through the vertex without intravenous contrast. COMPARISON:  None. FINDINGS: Brain: The brainstem, cerebellum, cerebral peduncles, thalami, basal ganglia, basilar cisterns, and ventricular system  appear within normal limits. No intracranial hemorrhage, mass lesion, or acute CVA. Vascular: Unremarkable Skull: Unremarkable Sinuses/Orbits: Unremarkable Other: No supplemental non-categorized findings. IMPRESSION: 1. No significant intracranial abnormality to explain the patient's current symptoms. Electronically Signed   By: Van Clines M.D.   On: 05/02/2017 20:13   US Renal  Result Date: 05/05/2017 CLINICAL DATA:  Acute renal failure. EXAM: RENAL / URINARY TRACT ULTRASOUND COMPLETE COMPARISON:  September 26, 2013 FINDINGS: Right Kidney: Length: 10.6 cm. Suggested small stones. Mildly prominent renal pelvis without caliectasis. A 1.6 cm cyst is identified in the midpole. Increased cortical echogenicity. Left Kidney: Length: 10.9 cm. Possible nonobstructive stones. A 2 cm cyst is seen in the upper pole. A complicated cystic mass in the  midpole with septations measures 1.5 x 3.4 x 2.0 cm today this mass measured 1.9 x 2.9 x 2.2 cm in April of 2015. Increased cortical echogenicity. Bladder: Decompressed with a Foley catheter. IMPRESSION: 1. Complicated cystic mass in the left mid kidney measuring 1.5 x 3.4 x 2.0 cm today versus 1.9 x 2.9 x 2.2 cm in April 2015. There have not likely been significant growth taking difference in slice selection into account. Recommend continued attention on follow-up. Other simple cysts in both kidneys. 2. Medical renal disease. 3. Suggested small nonobstructive stones. Electronically Signed   By: Dorise Bullion III M.D   On: 05/05/2017 14:31   Dg Chest Port 1 View  Result Date: 05/07/2017 CLINICAL DATA:  Hypertension.  Fever. EXAM: PORTABLE CHEST 1 VIEW COMPARISON:  05/02/2017 FINDINGS: Heart size is normal. Mediastinal shadows are normal. There is patchy density at both lung bases that could be atelectasis or mild pneumonia. No dense consolidation or lobar collapse. No abnormal bone finding. IMPRESSION: Development of patchy density at both lung bases right more than  left that could be atelectasis or mild pneumonia. Electronically Signed   By: Nelson Chimes M.D.   On: 05/07/2017 13:48   Dg Chest Portable 1 View  Result Date: 05/02/2017 CLINICAL DATA:  Altered mental status and fever. EXAM: PORTABLE CHEST 1 VIEW COMPARISON:  09/25/2013 chest radiograph FINDINGS: The cardiomediastinal silhouette is unremarkable. There is no evidence of focal airspace disease, pulmonary edema, suspicious pulmonary nodule/mass, pleural effusion, or pneumothorax. No acute bony abnormalities are identified. IMPRESSION: No evidence of active cardiopulmonary disease. Electronically Signed   By: Margarette Canada M.D.   On: 05/02/2017 20:31    Microbiology: Recent Results (from the past 240 hour(s))  MRSA PCR Screening     Status: None   Collection Time: 05/03/17  6:40 AM  Result Value Ref Range Status   MRSA by PCR NEGATIVE NEGATIVE Final    Comment:        The GeneXpert MRSA Assay (FDA approved for NASAL specimens only), is one component of a comprehensive MRSA colonization surveillance program. It is not intended to diagnose MRSA infection nor to guide or monitor treatment for MRSA infections.   Culture, Urine     Status: Abnormal   Collection Time: 05/03/17  3:00 PM  Result Value Ref Range Status   Specimen Description URINE, CATHETERIZED  Final   Special Requests NONE  Final   Culture (A)  Final    <10,000 COLONIES/mL INSIGNIFICANT GROWTH Performed at [facility], [address], [place], [place] [address]    Report Status 05/04/2017 FINAL  Final  Culture, Urine     Status: Abnormal   Collection Time: 05/07/17 12:34 PM  Result Value Ref Range Status   Specimen Description URINE, RANDOM  Final   Special Requests NONE  Final   Culture >=100,000 COLONIES/mL KLEBSIELLA PNEUMONIAE (A)  Final   Report Status 05/09/2017 FINAL  Final   Organism ID, Bacteria KLEBSIELLA PNEUMONIAE (A)  Final      Susceptibility   Klebsiella pneumoniae - MIC*    AMPICILLIN RESISTANT  Resistant     CEFAZOLIN <=4 SENSITIVE Sensitive     CEFTRIAXONE <=1 SENSITIVE Sensitive     CIPROFLOXACIN <=0.25 SENSITIVE Sensitive     GENTAMICIN <=1 SENSITIVE Sensitive     IMIPENEM <=0.25 SENSITIVE Sensitive     NITROFURANTOIN 64 INTERMEDIATE Intermediate     TRIMETH/SULFA <=20 SENSITIVE Sensitive     AMPICILLIN/SULBACTAM 4 SENSITIVE Sensitive  PIP/TAZO <=4 SENSITIVE Sensitive     Extended ESBL NEGATIVE Sensitive     * >=100,000 COLONIES/mL KLEBSIELLA PNEUMONIAE  Culture, blood (routine x 2)     Status: None (Preliminary result)   Collection Time: 05/07/17 12:47 PM  Result Value Ref Range Status   Specimen Description BLOOD LEFT HAND  Final   Special Requests   Final    BOTTLES DRAWN AEROBIC AND ANAEROBIC Blood Culture adequate volume   Culture   Final    NO GROWTH 3 DAYS Performed at [facility], [place] [address], [place], [place] [address]    Report Status PENDING  Incomplete  Culture, blood (routine x 2)     Status: None (Preliminary result)   Collection Time: 05/07/17 12:57 PM  Result Value Ref Range Status   Specimen Description BLOOD RIGHT HAND  Final   Special Requests   Final    BOTTLES DRAWN AEROBIC AND ANAEROBIC Blood Culture adequate volume   Culture   Final    NO GROWTH 3 DAYS Performed at [facility], [place] [address], [place],  [address]    Report Status PENDING  Incomplete     Labs: Basic Metabolic Panel: Recent Labs  Lab 05/04/17 0346  05/07/17 0517 05/08/17 0546 05/09/17 0644 05/09/17 1551 05/10/17 0519  NA 149*   < > 151* 143 147* 144 141  K 3.3*   < > 4.1 3.5 4.0 3.8 3.6  CL 120*   < > 117* 111 112* 107 107  CO2 24   < > 26 28 28 29 28   GLUCOSE 90   < > 93 99 93 102* 93  BUN 12   < > 16 19 23* 21* 18  CREATININE 1.33*   < > 1.40* 1.28* 1.37* 1.34* 1.33*  CALCIUM 9.3   < > 10.1 9.9 10.2 10.1 9.8  MG 2.1  --   --   --   --   --   --   PHOS 2.4*  --   --   --   --   --   --    < > = values in this interval not displayed.    Liver Function Tests: Recent Labs  Lab 05/04/17 0346  AST 18  ALT 12*  ALKPHOS 73  BILITOT 0.4  PROT 6.1*  ALBUMIN 3.5   No results for input(s): LIPASE, AMYLASE in the last 168 hours. No results for input(s): AMMONIA in the last 168 hours. CBC: Recent Labs  Lab 05/06/17 0344 05/07/17 0517 05/08/17 0546 05/09/17 0644 05/10/17 0519  WBC 13.5* 20.3* 13.6* 13.2* 12.5*  HGB 9.2* 10.2* 10.0* 10.1* 9.9*  HCT 28.5* 30.9* 30.1* 30.5* 30.4*  MCV 82.1 82.6 82.0 82.4 82.2  PLT 211 213 197 224 244   Cardiac Enzymes: No results for input(s): CKTOTAL, CKMB, CKMBINDEX, TROPONINI in the last 168 hours. BNP: BNP (last 3 results) No results for input(s): BNP in the last 8760 hours.  ProBNP (last 3 results) No results for input(s): PROBNP in the last 8760 hours.  CBG: No results for input(s): GLUCAP in the last 168 hours.     Signed:  Irine Seal MD.  [facility] 05/10/2017, 3:50 PM

## 2017-05-10 NOTE — Progress Notes (Signed)
Physical Therapy Treatment Patient Details Name: Tara Hurst MRN: [id number] DOB: 06-24-1961 Today's Date: 05/10/2017    History of Present Illness 56 yo female admitted with acute encephalopathy, tremors, rigidity, benzo withdrawal. Hx of schizoaffective d/o, HTn, lithium use    PT Comments    Pt continues to participate well with therapy. Discussed d/c plan-she plans to return home. Pt politely declines HHPT f/u. Recommend daily ambulation in the hallway with nursing supervision to increase activity. Will continue to follow.     Follow Up Recommendations  No PT follow up;Supervision - Intermittent; Home Safety Evaluation     Equipment Recommendations  None recommended by PT    Recommendations for Other Services       Precautions / Restrictions Precautions Precautions: Fall Restrictions Weight Bearing Restrictions: No    Mobility  Bed Mobility Overal bed mobility: Modified Independent                Transfers Overall transfer level: Modified independent                  Ambulation/Gait Ambulation/Gait assistance: Min guard Ambulation Distance (Feet): 150 Feet Assistive device: None Gait Pattern/deviations: Step-through pattern;Decreased step length - right;Decreased step length - left     General Gait Details: slow gait speed. Short step lengths. Pt tends to keep arms against her torso. No overt LOB. Mildly unsteady at times.    Stairs            Wheelchair Mobility    Modified Rankin (Stroke Patients Only)       Balance                                            Cognition Arousal/Alertness: Awake/alert Behavior During Therapy: WFL for tasks assessed/performed Overall Cognitive Status: History of cognitive impairments - at baseline                                        Exercises      General Comments        Pertinent Vitals/Pain Pain Assessment: No/denies pain    Home Living                       Prior Function            PT Goals (current goals can now be found in the care plan section) Progress towards PT goals: Progressing toward goals    Frequency    Min 3X/week      PT Plan Current plan remains appropriate    Co-evaluation              AM-PAC PT "6 Clicks" Daily Activity  Outcome Measure  Difficulty turning over in bed (including adjusting bedclothes, sheets and blankets)?: None Difficulty moving from lying on back to sitting on the side of the bed? : None Difficulty sitting down on and standing up from a chair with arms (e.g., wheelchair, bedside commode, etc,.)?: None Help needed moving to and from a bed to chair (including a wheelchair)?: None Help needed walking in hospital room?: A Little Help needed climbing 3-5 steps with a railing? : A Little 6 Click Score: 22    End of Session Equipment Utilized During Treatment: Gait belt Activity Tolerance: Patient tolerated  treatment well Patient left: in bed;with call bell/phone within reach;with bed alarm set   PT Visit Diagnosis: Muscle weakness (generalized) (M62.81);Difficulty in walking, not elsewhere classified (R26.2)     Time: 7276-1848 PT Time Calculation (min) (ACUTE ONLY): 14 min  Charges:  $Gait Training: 8-22 mins                    G Codes:          Weston Anna, MPT Pager: [phone]

## 2017-05-12 LAB — CULTURE, BLOOD (ROUTINE X 2)
CULTURE: NO GROWTH
CULTURE: NO GROWTH
SPECIAL REQUESTS: ADEQUATE
Special Requests: ADEQUATE

## 2017-10-18 ENCOUNTER — Emergency Department (HOSPITAL_COMMUNITY): Payer: Medicaid Other

## 2017-10-18 ENCOUNTER — Encounter (HOSPITAL_COMMUNITY): Payer: Self-pay | Admitting: Emergency Medicine

## 2017-10-18 ENCOUNTER — Emergency Department (HOSPITAL_COMMUNITY)
Admission: EM | Admit: 2017-10-18 | Discharge: 2017-10-19 | Disposition: A | Discharge status: Home / Self Care | Payer: Medicaid Other | Attending: Emergency Medicine | Admitting: Emergency Medicine

## 2017-10-18 DIAGNOSIS — R000 Tachycardia, unspecified: Secondary | ICD-10-CM | POA: Diagnosis not present

## 2017-10-18 DIAGNOSIS — I10 Essential (primary) hypertension: Secondary | ICD-10-CM | POA: Diagnosis not present

## 2017-10-18 DIAGNOSIS — F419 Anxiety disorder, unspecified: Secondary | ICD-10-CM

## 2017-10-18 DIAGNOSIS — R4182 Altered mental status, unspecified: Secondary | ICD-10-CM | POA: Diagnosis not present

## 2017-10-18 DIAGNOSIS — F259 Schizoaffective disorder, unspecified: Secondary | ICD-10-CM | POA: Insufficient documentation

## 2017-10-18 DIAGNOSIS — F411 Generalized anxiety disorder: Secondary | ICD-10-CM | POA: Diagnosis not present

## 2017-10-18 DIAGNOSIS — Z79899 Other long term (current) drug therapy: Secondary | ICD-10-CM | POA: Diagnosis not present

## 2017-10-18 DIAGNOSIS — E232 Diabetes insipidus: Secondary | ICD-10-CM

## 2017-10-18 LAB — COMPREHENSIVE METABOLIC PANEL
ALK PHOS: 116 U/L (ref 38–126)
ALT: 12 U/L — AB (ref 14–54)
AST: 18 U/L (ref 15–41)
Albumin: 5.1 g/dL — ABNORMAL HIGH (ref 3.5–5.0)
Anion gap: 13 (ref 5–15)
BILIRUBIN TOTAL: 0.8 mg/dL (ref 0.3–1.2)
BUN: 9 mg/dL (ref 6–20)
CALCIUM: 11.2 mg/dL — AB (ref 8.9–10.3)
CHLORIDE: 108 mmol/L (ref 101–111)
CO2: 24 mmol/L (ref 22–32)
CREATININE: 1.36 mg/dL — AB (ref 0.44–1.00)
GFR calc Af Amer: 49 mL/min — ABNORMAL LOW (ref >60)
GFR, EST NON AFRICAN AMERICAN: 43 mL/min — AB (ref >60)
Glucose, Bld: 96 mg/dL (ref 65–99)
Potassium: 4.3 mmol/L (ref 3.5–5.1)
Sodium: 145 mmol/L (ref 135–145)
Total Protein: 9.2 g/dL — ABNORMAL HIGH (ref 6.5–8.1)

## 2017-10-18 LAB — CBC WITH DIFFERENTIAL/PLATELET
Basophils Absolute: 0 10*3/uL (ref 0.0–0.1)
Basophils Relative: 0 %
EOS ABS: 0.1 10*3/uL (ref 0.0–0.7)
Eosinophils Relative: 1 %
HEMATOCRIT: 37.3 % (ref 36.0–46.0)
HEMOGLOBIN: 12.5 g/dL (ref 12.0–15.0)
LYMPHS ABS: 1.9 10*3/uL (ref 0.7–4.0)
Lymphocytes Relative: 18 %
MCH: 27.4 pg (ref 26.0–34.0)
MCHC: 33.5 g/dL (ref 30.0–36.0)
MCV: 81.6 fL (ref 78.0–100.0)
Monocytes Absolute: 0.9 10*3/uL (ref 0.1–1.0)
Monocytes Relative: 9 %
NEUTROS ABS: 7.7 10*3/uL (ref 1.7–7.7)
NEUTROS PCT: 72 %
Platelets: 288 10*3/uL (ref 150–400)
RBC: 4.57 MIL/uL (ref 3.87–5.11)
RDW: 13.3 % (ref 11.5–15.5)
WBC: 10.6 10*3/uL — AB (ref 4.0–10.5)

## 2017-10-18 LAB — RAPID URINE DRUG SCREEN, HOSP PERFORMED
AMPHETAMINES: NOT DETECTED
Barbiturates: NOT DETECTED
Benzodiazepines: NOT DETECTED
Cocaine: NOT DETECTED
OPIATES: NOT DETECTED
Tetrahydrocannabinol: NOT DETECTED

## 2017-10-18 LAB — D-DIMER, QUANTITATIVE: D-Dimer, Quant: 0.83 ug/mL-FEU — ABNORMAL HIGH (ref 0.00–0.50)

## 2017-10-18 LAB — URINALYSIS, ROUTINE W REFLEX MICROSCOPIC
BILIRUBIN URINE: NEGATIVE
Bacteria, UA: NONE SEEN
Glucose, UA: NEGATIVE mg/dL
HGB URINE DIPSTICK: NEGATIVE
Ketones, ur: NEGATIVE mg/dL
NITRITE: NEGATIVE
PH: 5 (ref 5.0–8.0)
Protein, ur: NEGATIVE mg/dL
Specific Gravity, Urine: 1.027 (ref 1.005–1.030)

## 2017-10-18 LAB — I-STAT TROPONIN, ED: Troponin i, poc: 0 ng/mL (ref 0.00–0.08)

## 2017-10-18 LAB — ETHANOL

## 2017-10-18 LAB — ACETAMINOPHEN LEVEL

## 2017-10-18 LAB — SALICYLATE LEVEL: Salicylate Lvl: <7 mg/dL (ref 2.8–30.0)

## 2017-10-18 MED ORDER — LORAZEPAM 2 MG/ML IJ SOLN
1.0000 mg | Freq: Once | INTRAMUSCULAR | Status: AC
  Administered 2017-10-18: 1 mg via INTRAVENOUS
  Filled 2017-10-18: qty 1

## 2017-10-18 MED ORDER — BENZTROPINE MESYLATE 1 MG/ML IJ SOLN: 0.5000 mg | Freq: Two times a day (BID) | INTRAMUSCULAR | Status: DC

## 2017-10-18 MED ORDER — LORAZEPAM 1 MG PO TABS
1.0000 mg | ORAL_TABLET | Freq: Four times a day (QID) | ORAL | Status: DC | PRN
  Administered 2017-10-18: 1 mg via ORAL
  Filled 2017-10-18: qty 1

## 2017-10-18 MED ORDER — BENZTROPINE MESYLATE 0.5 MG PO TABS
0.5000 mg | ORAL_TABLET | Freq: Two times a day (BID) | ORAL | Status: DC
  Administered 2017-10-18 – 2017-10-19 (×2): 0.5 mg via ORAL
  Filled 2017-10-18 (×2): qty 1

## 2017-10-18 MED ORDER — BUPROPION HCL ER (XL) 150 MG PO TB24
300.0000 mg | ORAL_TABLET | Freq: Every day | ORAL | Status: DC
  Administered 2017-10-18 – 2017-10-19 (×2): 300 mg via ORAL
  Filled 2017-10-18 (×2): qty 2

## 2017-10-18 MED ORDER — SODIUM CHLORIDE 0.9 % IJ SOLN
INTRAMUSCULAR | Status: AC
  Filled 2017-10-18: qty 50

## 2017-10-18 MED ORDER — RISPERIDONE 2 MG PO TABS
3.0000 mg | ORAL_TABLET | Freq: Two times a day (BID) | ORAL | Status: DC
  Administered 2017-10-18 – 2017-10-19 (×2): 3 mg via ORAL
  Filled 2017-10-18 (×2): qty 1

## 2017-10-18 MED ORDER — SODIUM CHLORIDE 0.9 % IV BOLUS
1000.0000 mL | Freq: Once | INTRAVENOUS | Status: AC
  Administered 2017-10-18: 1000 mL via INTRAVENOUS

## 2017-10-18 MED ORDER — IOPAMIDOL (ISOVUE-370) INJECTION 76%
100.0000 mL | Freq: Once | INTRAVENOUS | Status: AC | PRN
  Administered 2017-10-18: 100 mL via INTRAVENOUS

## 2017-10-18 MED ORDER — HYDROXYZINE HCL 25 MG PO TABS
50.0000 mg | ORAL_TABLET | Freq: Three times a day (TID) | ORAL | Status: DC
  Administered 2017-10-18 – 2017-10-19 (×2): 50 mg via ORAL
  Filled 2017-10-18 (×2): qty 2

## 2017-10-18 MED ORDER — IOPAMIDOL (ISOVUE-370) INJECTION 76%
INTRAVENOUS | Status: AC
  Filled 2017-10-18: qty 100

## 2017-10-18 NOTE — ED Provider Notes (Signed)
Tara Hurst is a 57 y.o. female, with a history of HTN, schizoaffective disorder, and anxiety, presenting to the ED with severe anxiety.   HPI from Benedetto Goad, PA-C: "Tara Hurst is a 57 y.o. Female with a history of hypertension, hyponatremia, anxiety and schizoaffective disorder, who presents to the ED for evaluation of anxiety.  Patient reports she is very stressed, and has been anxious her whole life but it is been worse today.  Patient has a very hard time expressing herself due to her anxiety, she is tremulous and has difficulty finishing her thoughts.  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  She reports she was unable to take her anxiety medications today, really uses Ativan for acute panic attacks, but her medications were supposed to be delivered to her house yesterday and were not.  Patient denies any focal chest pain, shortness of breath, abdominal pain.  Patient also denies headaches, lightheadedness, dizziness or vision changes, but patient has a very hard time answering questions beyond yes or no, and is not able to elaborate.  She currently denies SI, HI or AVH."  Past Medical History:  Diagnosis Date  . Hypertension   . Schizoaffective disorder (HCC)      Physical Exam  BP 111/90 (BP Location: Left Arm)   Pulse 98   Temp 98.8 F (37.1 C) (Oral)   Resp 18   SpO2 100%   Physical Exam  Constitutional: She appears well-developed and well-nourished. No distress.  HENT:  Head: Normocephalic and atraumatic.  Eyes: Conjunctivae are normal.  Neck: Neck supple.  Cardiovascular: Normal rate, regular rhythm, normal heart sounds and intact distal pulses.  Pulmonary/Chest: Effort normal and breath sounds normal. No respiratory distress.  Abdominal: Soft. There is no tenderness. There is no guarding.  Musculoskeletal: She exhibits no edema.  Lymphadenopathy:    She has no  cervical adenopathy.  Neurological: She is alert.  Skin: Skin is warm and dry. She is not diaphoretic.  Psychiatric: She has a normal mood and affect. Her behavior is normal.  Smiling, interactive, makes good eye contact.   Nursing note and vitals reviewed.   ED Course/Procedures   Clinical Course as of Oct 19 2055  Thu Oct 18, 2017  1612 Took patient care handoff   [SJ]  2057 Consistent with previous values.  Creatinine(!): 1.36 [SJ]    Clinical Course User Index [SJ] Kayah Hecker C, PA-C    Procedures   Results for orders placed or performed during the hospital encounter of 10/18/17  Comprehensive metabolic panel  Result Value Ref Range   Sodium 145 135 - 145 mmol/L   Potassium 4.3 3.5 - 5.1 mmol/L   Chloride 108 101 - 111 mmol/L   CO2 24 22 - 32 mmol/L   Glucose, Bld 96 65 - 99 mg/dL   BUN 9 6 - 20 mg/dL   Creatinine, Ser 1.36 (H) 0.44 - 1.00 mg/dL   Calcium 11.2 (H) 8.9 - 10.3 mg/dL   Total Protein 9.2 (H) 6.5 - 8.1 g/dL   Albumin 5.1 (H) 3.5 - 5.0 g/dL   AST 18 15 - 41 U/L   ALT 12 (L) 14 - 54 U/L   Alkaline Phosphatase 116 38 - 126 U/L   Total Bilirubin 0.8 0.3 - 1.2 mg/dL   GFR calc non Af Amer 43 (L) >60 mL/min   GFR calc  Af Amer 49 (L) >60 mL/min   Anion gap 13 5 - 15  Ethanol  Result Value Ref Range   Alcohol, Ethyl (B) <10 <10 mg/dL  CBC with Diff  Result Value Ref Range   WBC 10.6 (H) 4.0 - 10.5 K/uL   RBC 4.57 3.87 - 5.11 MIL/uL   Hemoglobin 12.5 12.0 - 15.0 g/dL   HCT 37.3 36.0 - 46.0 %   MCV 81.6 78.0 - 100.0 fL   MCH 27.4 26.0 - 34.0 pg   MCHC 33.5 30.0 - 36.0 g/dL   RDW 13.3 11.5 - 15.5 %   Platelets 288 150 - 400 K/uL   Neutrophils Relative % 72 %   Neutro Abs 7.7 1.7 - 7.7 K/uL   Lymphocytes Relative 18 %   Lymphs Abs 1.9 0.7 - 4.0 K/uL   Monocytes Relative 9 %   Monocytes Absolute 0.9 0.1 - 1.0 K/uL   Eosinophils Relative 1 %   Eosinophils Absolute 0.1 0.0 - 0.7 K/uL   Basophils Relative 0 %   Basophils Absolute 0.0 0.0 - 0.1 K/uL   Salicylate level  Result Value Ref Range   Salicylate Lvl <5.4 2.8 - 30.0 mg/dL  Acetaminophen level  Result Value Ref Range   Acetaminophen (Tylenol), Serum <10 (L) 10 - 30 ug/mL  D-dimer, quantitative (not at [facility])  Result Value Ref Range   D-Dimer, Quant 0.83 (H) 0.00 - 0.50 ug/mL-FEU  I-stat troponin, ED  Result Value Ref Range   Troponin i, poc 0.00 0.00 - 0.08 ng/mL   Comment 3           Dg Chest 2 View  Result Date: 10/18/2017 CLINICAL DATA:  Anxiety.  Schizophrenia. EXAM: CHEST - 2 VIEW COMPARISON:  05/08/2017. FINDINGS: Mediastinum and hilar structures normal. Lungs are clear. No pleural effusion or pneumothorax. Degenerative change thoracic spine. IMPRESSION: No acute cardiopulmonary disease. Electronically Signed   By: Marcello Moores  Register   On: 10/18/2017 12:32   Ct Head Wo Contrast  Result Date: 10/18/2017 CLINICAL DATA:  Altered LOC EXAM: CT HEAD WITHOUT CONTRAST TECHNIQUE: Contiguous axial images were obtained from the base of the skull through the vertex without intravenous contrast. COMPARISON:  05/02/2017 FINDINGS: Brain: No acute territorial infarction, hemorrhage or intracranial mass. Stable ventricle size. Vascular: No hyperdense vessels. Minimal calcification at the carotid siphons. Skull: Normal. Negative for fracture or focal lesion. Sinuses/Orbits: No acute finding. Other: None IMPRESSION: Negative.  No CT evidence for acute intracranial abnormality. Electronically Signed   By: Donavan Foil M.D.   On: 10/18/2017 17:25   Ct Angio Chest Pe W And/or Wo Contrast  Result Date: 10/18/2017 CLINICAL DATA:  Hypertension, anxiety. EXAM: CT ANGIOGRAPHY CHEST WITH CONTRAST TECHNIQUE: Multidetector CT imaging of the chest was performed using the standard protocol during bolus administration of intravenous contrast. Multiplanar CT image reconstructions and MIPs were obtained to evaluate the vascular anatomy. CONTRAST:  173mL ISOVUE-370 IOPAMIDOL (ISOVUE-370) INJECTION 76%  COMPARISON:  Radiographs of same day. FINDINGS: Cardiovascular: Satisfactory opacification of the pulmonary arteries to the segmental level. No evidence of pulmonary embolism. Normal heart size. No pericardial effusion. Mediastinum/Nodes: No enlarged mediastinal, hilar, or axillary lymph nodes. Thyroid gland, trachea, and esophagus demonstrate no significant findings. Lungs/Pleura: Lungs are clear. No pleural effusion or pneumothorax. Upper Abdomen: No acute abnormality. Musculoskeletal: No chest wall abnormality. No acute or significant osseous findings. Review of the MIP images confirms the above findings. IMPRESSION: No evidence of pulmonary embolus.  No abnormality seen in the chest. Electronically Signed  By: Marijo Conception, M.D.   On: 10/18/2017 17:34    MDM    Patient presents with severe anxiety.  She was not able to have access to her medications today.  At the time I evaluated the patient she was able to have a coherent conversation with me.  The [facility] counselor interviewed patient and recommended overnight observation.  No evidence of PE on chest CTA. Patient medically cleared.  Home medications ordered.  Placed in psych hold.   Vitals:   10/18/17 1100 10/18/17 1300 10/18/17 1302 10/18/17 1440  BP: (!) 120/95  105/79 111/90  Pulse: (!) 125 (!) 114  98  Resp:    18  Temp:      TempSrc:      SpO2: 100% 100%  100%   Vitals:   10/18/17 1630 10/18/17 1900 10/18/17 1908 10/18/17 1910  BP: 99/79  (!) 135/99 (!) 135/99  Pulse:  94 99 96  Resp:    18  Temp:      TempSrc:      SpO2:  100% 100% 100%      Layla Maw 10/18/17 2057    Davonna Belling, MD 10/18/17 2357

## 2017-10-18 NOTE — ED Notes (Signed)
Bed: [facility] Expected date:  Expected time:  Means of arrival:  Comments: For triage 4

## 2017-10-18 NOTE — ED Triage Notes (Signed)
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

## 2017-10-18 NOTE — ED Notes (Signed)
Pt given food

## 2017-10-18 NOTE — ED Provider Notes (Signed)
[facility] DEPT Provider Note   CSN: [id number] Arrival date & time: 10/18/17  1004     History   Chief Complaint Chief Complaint  Patient presents with  . Anxiety    HPI Tara Hurst is a 57 y.o. female.  Tara Hurst is a 57 y.o. Female with a history of hypertension, hyponatremia, anxiety and schizoaffective disorder, who presents to the ED for evaluation of anxiety.  Patient reports she is very stressed, and has been anxious her whole life but it is been worse today.  Patient has a very hard time expressing herself due to her anxiety, she is tremulous and has difficulty finishing her thoughts.  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  She reports she was unable to take her anxiety medications today, really uses Ativan for acute panic attacks, but her medications were supposed to be delivered to her house yesterday and were not.  Patient denies any focal chest pain, shortness of breath, abdominal pain.  Patient also denies headaches, lightheadedness, dizziness or vision changes, but patient has a very hard time answering questions beyond yes or no, and is not able to elaborate.  She currently denies SI, HI or AVH.      Past Medical History:  Diagnosis Date  . Hypertension   . Schizoaffective disorder [facility])     Patient Active Problem List   Diagnosis Date Noted  . UTI due to Klebsiella species   . Hypernatremia   . Acute encephalopathy 05/03/2017  . ARF (acute renal failure) ([place]) 05/03/2017  . 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 HCAP (healthcare-associated pneumonia) 09/25/2013  . Schizoaffective disorder ([place]) 09/25/2013  . Hypertension 09/25/2013  . Abnormal lithium level in blood 09/25/2013    History reviewed. No pertinent surgical history.   OB History   None      Home Medications    Prior to Admission  medications   Medication Sig Start Date End Date Taking? Authorizing Provider  buPROPion (WELLBUTRIN XL) 300 MG 24 hr tablet Take 300 mg daily by mouth.   Yes [provider]  LORazepam (ATIVAN) 1 MG tablet Take 1-2 mg 2 (two) times daily by mouth. Take 1mg  by mouth in the morning and take 2mg  by mouth at bedtime   Yes [provider]  losartan (COZAAR) 50 MG tablet Take 1 tablet by mouth daily.   Yes [provider]  risperiDONE (RISPERDAL) 3 MG tablet Take 3 mg 2 (two) times daily by mouth.   Yes [provider]  benztropine (COGENTIN) 0.5 MG tablet Take 1 tablet (0.5 mg total) by mouth 2 (two) times daily. Patient not taking: Reported on 10/18/2017 09/28/13   Kinnie Feil, MD  diphenhydrAMINE (BENADRYL) 25 mg capsule Take 1 capsule (25 mg total) by mouth 2 (two) times daily as needed for sleep (Extrapyramidal symptoms). Patient not taking: Reported on 10/18/2017 09/28/13   Kinnie Feil, MD  folic acid (FOLVITE) 1 MG tablet Take 1 tablet (1 mg total) daily by mouth. Patient not taking: Reported on 10/18/2017 05/11/17   Eugenie Filler, MD  Multiple Vitamin (MULTIVITAMIN WITH MINERALS) TABS tablet Take 1 tablet daily by mouth. Patient not taking: Reported on 10/18/2017 05/11/17   Eugenie Filler, MD  thiamine 100 MG tablet Take 1 tablet (100 mg total) daily by mouth. Patient not taking: Reported on 10/18/2017  05/11/17   Eugenie Filler, MD    Family History Family History  Problem Relation Age of Onset  . Heart disease Mother   . Diabetic kidney disease Mother     Social History Social History   Tobacco Use  . Smoking status: Never Smoker  . Smokeless tobacco: Never Used  Substance Use Topics  . Alcohol use: No  . Drug use: No     Allergies   Patient has no known allergies.   Review of Systems Review of Systems  Constitutional: Negative for chills and fever.  HENT: Negative for congestion, rhinorrhea and sore throat.   Eyes:  Negative for visual disturbance.  Respiratory: Negative for cough, chest tightness and shortness of breath.   Cardiovascular: Negative for chest pain, palpitations and leg swelling.  Gastrointestinal: Negative for abdominal pain, nausea and vomiting.  Genitourinary: Negative for dysuria and frequency.  Musculoskeletal: Negative for arthralgias and myalgias.  Skin: Negative for color change and rash.  Neurological: Negative for dizziness, syncope, light-headedness and headaches.  Psychiatric/Behavioral: Negative for suicidal ideas. The patient is nervous/anxious.      Physical Exam Updated Vital Signs BP 111/90 (BP Location: Left Arm)   Pulse 98   Temp 98.8 F (37.1 C) (Oral)   Resp 18   SpO2 100%   Physical Exam  Constitutional: She appears well-developed and well-nourished. No distress.  Patient appears extremely anxious, she is slightly tremulous, has a hard time articulating her thoughts or answering complex questions.  HENT:  Head: Normocephalic and atraumatic.  Mouth/Throat: Oropharynx is clear and moist.  Eyes: Pupils are equal, round, and reactive to light. EOM are normal. Right eye exhibits no discharge. Left eye exhibits no discharge.  Neck: Neck supple.  Cardiovascular: Normal rate, regular rhythm, normal heart sounds and intact distal pulses.  Pulmonary/Chest: Effort normal and breath sounds normal. No stridor. No respiratory distress. She has no wheezes. She has no rales.  Respirations equal and unlabored, patient able to speak in full sentences, lungs clear to auscultation bilaterally  Abdominal: Soft. Bowel sounds are normal. She exhibits no distension and no mass. There is no tenderness. There is no guarding.  Neurological: She is alert. Coordination normal.  Patient is alert, she is oriented to self, and place, she can follow simple commands, speech is clear but responses are delayed, patient has a hard time completing her thoughts.  She is moving all extremities  without difficulty, normal strength in bilateral upper and lower extremities.  Skin: Skin is warm and dry. Capillary refill takes less than 2 seconds. She is not diaphoretic.  Nursing note and vitals reviewed.    ED Treatments / Results  Labs (all labs ordered are listed, but only abnormal results are displayed) Labs Reviewed  COMPREHENSIVE METABOLIC PANEL - Abnormal; Notable for the following components:      Result Value   Creatinine, Ser 1.36 (*)    Calcium 11.2 (*)    Total Protein 9.2 (*)    Albumin 5.1 (*)    ALT 12 (*)    GFR calc non Af Amer 43 (*)    GFR calc Af Amer 49 (*)    All other components within normal limits  CBC WITH DIFFERENTIAL/PLATELET - Abnormal; Notable for the following components:   WBC 10.6 (*)    All other components within normal limits  ACETAMINOPHEN LEVEL - Abnormal; Notable for the following components:   Acetaminophen (Tylenol), Serum <10 (*)    All other components within normal limits  D-DIMER,  QUANTITATIVE (NOT AT [facility]) - Abnormal; Notable for the following components:   D-Dimer, Quant 0.83 (*)    All other components within normal limits  ETHANOL  SALICYLATE LEVEL  RAPID URINE DRUG SCREEN, HOSP PERFORMED  URINALYSIS, ROUTINE W REFLEX MICROSCOPIC  I-STAT TROPONIN, ED    EKG EKG Interpretation  Date/Time:  Thursday October 18 2017 14:50:58 EDT Ventricular Rate:  94 PR Interval:    QRS Duration: 77 QT Interval:  341 QTC Calculation: 427 R Axis:   39 Text Interpretation:  Sinus rhythm No significant change since last tracing Confirmed by Wandra Arthurs [phone]) on 10/18/2017 2:54:48 PM Also confirmed by Wandra Arthurs [phone]), editor Lynder Parents [phone])  on 10/18/2017 2:56:15 PM   Radiology Dg Chest 2 View  Result Date: 10/18/2017 CLINICAL DATA:  Anxiety.  Schizophrenia. EXAM: CHEST - 2 VIEW COMPARISON:  05/08/2017. FINDINGS: Mediastinum and hilar structures normal. Lungs are clear. No pleural effusion or pneumothorax. Degenerative  change thoracic spine. IMPRESSION: No acute cardiopulmonary disease. Electronically Signed   By: Marcello Moores  Register   On: 10/18/2017 12:32    Procedures Procedures (including critical care time)  Medications Ordered in ED Medications  sodium chloride 0.9 % bolus 1,000 mL (1,000 mLs Intravenous New Bag/Given 10/18/17 1257)  LORazepam (ATIVAN) injection 1 mg (1 mg Intravenous Given 10/18/17 1257)  LORazepam (ATIVAN) injection 1 mg (1 mg Intravenous Given 10/18/17 1438)     Initial Impression / Assessment and Plan / ED Course  I have reviewed the triage vital signs and the nursing notes.  Pertinent labs & imaging results that were available during my care of the patient were reviewed by me and considered in my medical decision making (see chart for details).  Presents to the emergency department for evaluation of anxiety.  On initial exam patient is tachycardic to 118, vitals otherwise normal, patient appears extremely anxious and tremulous on exam, she has a hard time articulating her thoughts, cancels simple yes or no questions and has a hard time elaborating on her symptoms patient recently ran out of her acute anxiety medication.  She is currently denying SI, HI or AVH.  Given the patient has a hard time articulating the symptoms she is having will initiate broad work-up especially given patient's elevated heart rate.  Will also give 1 L fluid bolus and 1 mg IV Ativan.  White count is 10.6, normal hemoglobin, no acute electrolyte derangements, patient's creatinine is 1.36 which appears to be the patient's baseline, no acute change in liver function, salicylate, Tylenol and ethanol levels are all negative.  Patient's troponin is zero and her EKG does not show concerning ischemic changes.  Her d-dimer is elevated at 0.83, will proceed with CTA of the chest.  Patient discussed with Dr. Darl Householder who saw and evaluated the patient as well and also recommends a CT of the head.  Tachycardia has improved with  fluids and Ativan, patient continues to appear anxious although does report some improvement in her symptoms.  At shift change CTA of the chest and CT head are still pending.  If the studies are normal patient will be medically cleared, and TTS consult has already been placed for psychiatric evaluation, concern given patient's extreme anxiety that she could benefit from some psychiatric stabilization.  At shift change care signed out to [facility], who will follow up on CTs of the head and chest, and if these are normal have the patient proceed with psychiatric evaluation.  Patient discussed with Dr. Darl Householder, who  saw patient as well and agrees with plan.   Final Clinical Impressions(s) / ED Diagnoses   Final diagnoses:  Anxiety  Tachycardia    ED Discharge Orders    None       Janet Berlin 10/18/17 1618    Drenda Freeze, MD 10/19/17 [phone]

## 2017-10-18 NOTE — BH Assessment (Signed)
Tele Assessment Note   Patient Name: Tara Hurst MRN: [id number] Referring Physician: Drenda Freeze, MD Location of Patient:  [facility] Location of Provider: Behavioral Health TTS Department  JERNEE MURTAUGH is an 57 y.o. female present to [facility] for sever anxiety. Patient report her medications was scheduled to be delivered 3 or 4 days ago but has not came. Report she has been very stressed, and has been anxious her whole life but it is worse today. Prior to not having her medication patient report she was taking her medication as prescribed. Patient denies suicidal / homicidal ideations, auditory / visual hallucinations. Patient denies feeling paranoid. Patient present in with a pleasant yet anxious affect. Patient had a hard time expressing herself due to her anxiety.   Diagnosis:  F41.1  Generalized anxiety disorder   Past Medical History:  Past Medical History:  Diagnosis Date  . Hypertension   . Schizoaffective disorder ([place])     History reviewed. No pertinent surgical history.  Family History:  Family History  Problem Relation Age of Onset  . Heart disease Mother   . Diabetic kidney disease Mother     Social History:  reports that she has never smoked. She has never used smokeless tobacco. She reports that she does not drink alcohol or use drugs.  Additional Social History:  Alcohol / Drug Use Pain Medications: see MAR Prescriptions: see MAR Over the Counter: see MAR History of alcohol / drug use?: No history of alcohol / drug abuse Longest period of sobriety (when/how long): n/a  CIWA: CIWA-Ar BP: 108/88 Pulse Rate: 98 COWS:    Allergies: No Known Allergies  Home Medications:  (Not in a hospital admission)  OB/GYN Status:  No LMP recorded. Patient is postmenopausal.  General Assessment Data Location of Assessment: [facility] Assessment: In system Is this a Tele or Face-to-Face Assessment?: Face-to-Face Is this an Initial Assessment or a Re-assessment for  this encounter?: Initial Assessment Marital status: Single Maiden name: n/a Is patient pregnant?: No Pregnancy Status: No Living Arrangements: Alone Can pt return to current living arrangement?: Yes Admission Status: Voluntary Is patient capable of signing voluntary admission?: Yes Referral Source: Self/Family/Friend Insurance type: Medicaid     Crisis Care Plan Living Arrangements: Alone Legal Guardian: Other:(self) Name of Psychiatrist: unknown Name of Therapist: unknown  Education Status Is patient currently in school?: Yes Name of school: [employer] to self with the past 6 months Suicidal Ideation: No Has patient been a risk to self within the past 6 months prior to admission? : No Suicidal Intent: No Has patient had any suicidal intent within the past 6 months prior to admission? : No Is patient at risk for suicide?: No Suicidal Plan?: No Has patient had any suicidal plan within the past 6 months prior to admission? : No Access to Means: No What has been your use of drugs/alcohol within the last 12 months?: pt denies Previous Attempts/Gestures: No How many times?: 0 Other Self Harm Risks: pt denies Triggers for Past Attempts: Other (Comment) Intentional Self Injurious Behavior: None Family Suicide History: No Recent stressful life event(s): Other (Comment)(pt has not received meds in the mail ) Persecutory voices/beliefs?: No Depression: No Substance abuse history and/or treatment for substance abuse?: No Suicide prevention information given to non-admitted patients: Not applicable  Risk to Others within the past 6 months Homicidal Ideation: No Does patient have any lifetime risk of violence toward others beyond the six months prior to admission? : No Thoughts of  Harm to Others: No Current Homicidal Intent: No Current Homicidal Plan: No Access to Homicidal Means: No Identified Victim: n/a History of harm to others?: No Assessment of Violence: None  Noted Violent Behavior Description: none noted Does patient have access to weapons?: No Criminal Charges Pending?: No Does patient have a court date: No Is patient on probation?: No  Psychosis Hallucinations: None noted Delusions: None noted  Mental Status Report Appearance/Hygiene: Disheveled Eye Contact: Fair Motor Activity: Freedom of movement Speech: Logical/coherent Level of Consciousness: Alert Mood: Anxious Affect: Anxious Anxiety Level: Moderate Thought Processes: Unable to Assess(pt anxious ) Judgement: Unable to Assess Orientation: Person, Place, Time, Situation Obsessive Compulsive Thoughts/Behaviors: None  Cognitive Functioning Concentration: Normal Memory: Recent Intact, Remote Intact Is patient IDD: No Is patient DD?: No Insight: Fair Impulse Control: Fair Appetite: Fair Have you had any weight changes? : No Change Sleep: No Change Vegetative Symptoms: None  ADLScreening [facility] Assessment Services) Patient's cognitive ability adequate to safely complete daily activities?: Yes Patient able to express need for assistance with ADLs?: Yes Independently performs ADLs?: Yes (appropriate for developmental age)  Prior Inpatient Therapy Prior Inpatient Therapy: No  Prior Outpatient Therapy Prior Outpatient Therapy: No Does patient have an ACCT team?: No Does patient have Intensive In-House Services?  : No Does patient have [facility] services? : No Does patient have P4CC services?: No  ADL Screening (condition at time of admission) Patient's cognitive ability adequate to safely complete daily activities?: Yes Is the patient deaf or have difficulty hearing?: No Does the patient have difficulty seeing, even when wearing glasses/contacts?: No Does the patient have difficulty concentrating, remembering, or making decisions?: No Patient able to express need for assistance with ADLs?: Yes Does the patient have difficulty dressing or bathing?: No Independently  performs ADLs?: Yes (appropriate for developmental age) Does the patient have difficulty walking or climbing stairs?: No       Abuse/Neglect Assessment (Assessment to be complete while patient is alone) Abuse/Neglect Assessment Can Be Completed: Yes Physical Abuse: Denies Verbal Abuse: Denies Sexual Abuse: Denies Exploitation of patient/patient's resources: Denies Self-Neglect: Denies     Regulatory affairs officer (For Healthcare) Does Patient Have a Medical Advance Directive?: No Would patient like information on creating a medical advance directive?: No - Patient declined          Disposition:  Disposition Initial Assessment Completed for this Encounter: Yes Patient referred to: Other (Comment)(Observe overnight )   Leomia Blake 10/18/2017 5:52 PM

## 2017-10-18 NOTE — ED Notes (Signed)
Patient transported to X-ray 

## 2017-10-18 NOTE — ED Triage Notes (Signed)
Pt reports that she is very stressed and been anxious her whole life.  Pt having hard time expressing her self due to anxiety. Pt states that she has schizophrenia. Reports taking her medications this morning.

## 2017-10-18 NOTE — ED Notes (Signed)
Pt presents with anxiety and feeling stressed her entire life.  Denies SI, HI or AVH.  Denies feeling hopeless.  A&O x 3, no distress noted, cooperative at present.  Monitoring for safety, Q 15 min checks in effect.

## 2017-10-19 DIAGNOSIS — R450 Nervousness: Secondary | ICD-10-CM

## 2017-10-19 DIAGNOSIS — F411 Generalized anxiety disorder: Secondary | ICD-10-CM | POA: Diagnosis present

## 2017-10-19 DIAGNOSIS — R4587 Impulsiveness: Secondary | ICD-10-CM

## 2017-10-19 DIAGNOSIS — F419 Anxiety disorder, unspecified: Secondary | ICD-10-CM

## 2017-10-19 NOTE — Consult Note (Addendum)
[facility] Face-to-Face Psychiatry Consult   Reason for Consult:  Anxiety Referring Physician:  EDP Patient Identification: Tara Hurst MRN:  [id number] Principal Diagnosis: GAD (generalized anxiety disorder) Diagnosis:   Patient Active Problem List   Diagnosis Date Noted  . GAD (generalized anxiety disorder) [F41.1] 10/19/2017  . UTI due to Klebsiella species [N39.0, B96.1]   . Hypernatremia [E87.0]   . Acute encephalopathy [G93.40] 05/03/2017  . ARF (acute renal failure) ([place]) [N17.9] 05/03/2017  . HCAP (healthcare-associated pneumonia) [J18.9] 09/25/2013  . Hypertension [I10] 09/25/2013  . Abnormal lithium level in blood [R78.89] 09/25/2013    Total Time spent with patient: 45 minutes  Subjective:   Tara Hurst is a 57 y.o. female patient admitted with severe anxiety.  HPI:  Pt was seen and chart reviewed with treatment team and Dr Mariea Clonts.  Pt denies suicidal/homicidal ideation, denies auditory/visual hallucinations and does not appear to be responding to internal stimuli. 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 Pt called EMS and came to the hospital. Pt stated she has not taken any of her meds for 3-4 days because she is waiting for them to be delivered to her house. Pt stated she lives in an apartment and feels safe at home. Pt's UDS and BAL negative.  Pt is psychiatrically clear for discharge.   Past Psychiatric History: As above  Risk to Self: None Risk to Others: None Prior Inpatient Therapy: Prior Inpatient Therapy: No Prior Outpatient Therapy: Prior Outpatient Therapy: No Does patient have an ACCT team?: No Does patient have Intensive In-House Services?  : No Does patient have [facility] services? : No Does patient have P4CC services?: No  Past Medical History:  Past Medical History:  Diagnosis Date  . Hypertension   . Schizoaffective disorder ([place])    History reviewed. No pertinent surgical history. Family History:   Family History  Problem Relation Age of Onset  . Heart disease Mother   . Diabetic kidney disease Mother    Family Psychiatric  History: Unknown Social History:  Social History   Substance and Sexual Activity  Alcohol Use No     Social History   Substance and Sexual Activity  Drug Use No    Social History   Socioeconomic History  . Marital status: Single    Spouse name: Not on file  . Number of children: Not on file  . Years of education: Not on file  . Highest education level: Not on file  Occupational History  . Not on file  Social Needs  . Financial resource strain: Not on file  . Food insecurity:    Worry: Not on file    Inability: Not on file  . Transportation needs:    Medical: Not on file    Non-medical: Not on file  Tobacco Use  . Smoking status: Never Smoker  . Smokeless tobacco: Never Used  Substance and Sexual Activity  . Alcohol use: No  . Drug use: No  . Sexual activity: Never  Lifestyle  . Physical activity:    Days per week: Not on file    Minutes per session: Not on file  . Stress: Not on file  Relationships  . Social connections:    Talks on phone: Not on file    Gets together: Not on file    Attends religious service: Not on file    Active member of club or organization: Not on file  Attends meetings of clubs or organizations: Not on file    Relationship status: Not on file  Other Topics Concern  . Not on file  Social History Narrative  . Not on file   Additional Social History: N/A    Allergies:  No Known Allergies  Labs:  Results for orders placed or performed during the hospital encounter of 10/18/17 (from the past 48 hour(s))  Comprehensive metabolic panel     Status: Abnormal   Collection Time: 10/18/17 12:56 PM  Result Value Ref Range   Sodium 145 135 - 145 mmol/L   Potassium 4.3 3.5 - 5.1 mmol/L   Chloride 108 101 - 111 mmol/L   CO2 24 22 - 32 mmol/L   Glucose, Bld 96 65 - 99 mg/dL   BUN 9 6 - 20 mg/dL    Creatinine, Ser 1.36 (H) 0.44 - 1.00 mg/dL   Calcium 11.2 (H) 8.9 - 10.3 mg/dL   Total Protein 9.2 (H) 6.5 - 8.1 g/dL   Albumin 5.1 (H) 3.5 - 5.0 g/dL   AST 18 15 - 41 U/L   ALT 12 (L) 14 - 54 U/L   Alkaline Phosphatase 116 38 - 126 U/L   Total Bilirubin 0.8 0.3 - 1.2 mg/dL   GFR calc non Af Amer 43 (L) >60 mL/min   GFR calc Af Amer 49 (L) >60 mL/min    Comment: (NOTE) The eGFR has been calculated using the CKD EPI equation. This calculation has not been validated in all clinical situations. eGFR's persistently <60 mL/min signify possible Chronic Kidney Disease.    Anion gap 13 5 - 15    Comment: Performed at [facility], [place] [address], [place], [place] [address]  Ethanol     Status: None   Collection Time: 10/18/17 12:56 PM  Result Value Ref Range   Alcohol, Ethyl (B) <10 <10 mg/dL    Comment:        LOWEST DETECTABLE LIMIT FOR SERUM ALCOHOL IS 10 mg/dL FOR MEDICAL PURPOSES ONLY Performed at [facility], [place] [address], [place], [place] [address]   CBC with Diff     Status: Abnormal   Collection Time: 10/18/17 12:56 PM  Result Value Ref Range   WBC 10.6 (H) 4.0 - 10.5 K/uL   RBC 4.57 3.87 - 5.11 MIL/uL   Hemoglobin 12.5 12.0 - 15.0 g/dL   HCT 37.3 36.0 - 46.0 %   MCV 81.6 78.0 - 100.0 fL   MCH 27.4 26.0 - 34.0 pg   MCHC 33.5 30.0 - 36.0 g/dL   RDW 13.3 11.5 - 15.5 %   Platelets 288 150 - 400 K/uL   Neutrophils Relative % 72 %   Neutro Abs 7.7 1.7 - 7.7 K/uL   Lymphocytes Relative 18 %   Lymphs Abs 1.9 0.7 - 4.0 K/uL   Monocytes Relative 9 %   Monocytes Absolute 0.9 0.1 - 1.0 K/uL   Eosinophils Relative 1 %   Eosinophils Absolute 0.1 0.0 - 0.7 K/uL   Basophils Relative 0 %   Basophils Absolute 0.0 0.0 - 0.1 K/uL    Comment: Performed at [facility], [place] [address], [place], [place] [address]  Salicylate level     Status: None   Collection Time: 10/18/17 12:56 PM  Result Value Ref Range   Salicylate Lvl  <5.6 2.8 - 30.0 mg/dL    Comment: Performed at [facility], [place] [address] [place], [place] [address]  Acetaminophen level  Status: Abnormal   Collection Time: 10/18/17 12:56 PM  Result Value Ref Range   Acetaminophen (Tylenol), Serum <10 (L) 10 - 30 ug/mL    Comment:        THERAPEUTIC CONCENTRATIONS VARY SIGNIFICANTLY. A RANGE OF 10-30 ug/mL MAY BE AN EFFECTIVE CONCENTRATION FOR MANY PATIENTS. HOWEVER, SOME ARE BEST TREATED AT CONCENTRATIONS OUTSIDE THIS RANGE. ACETAMINOPHEN CONCENTRATIONS >150 ug/mL AT 4 HOURS AFTER INGESTION AND >50 ug/mL AT 12 HOURS AFTER INGESTION ARE OFTEN ASSOCIATED WITH TOXIC REACTIONS. Performed at [facility], [place] [address], [place], [place] [address]   D-dimer, quantitative (not at [facility])     Status: Abnormal   Collection Time: 10/18/17 12:56 PM  Result Value Ref Range   D-Dimer, Quant 0.83 (H) 0.00 - 0.50 ug/mL-FEU    Comment: (NOTE) At the manufacturer cut-off of 0.50 ug/mL FEU, this assay has been documented to exclude PE with a sensitivity and negative predictive value of 97 to 99%.  At this time, this assay has not been approved by the FDA to exclude DVT/VTE. Results should be correlated with clinical presentation. Performed at [facility], [place] [address] [place], [place] [address]   I-stat troponin, ED     Status: None   Collection Time: 10/18/17  1:08 PM  Result Value Ref Range   Troponin i, poc 0.00 0.00 - 0.08 ng/mL   Comment 3            Comment: Due to the release kinetics of cTnI, a negative result within the first hours of the onset of symptoms does not rule out myocardial infarction with certainty. If myocardial infarction is still suspected, repeat the test at appropriate intervals.   Urine rapid drug screen (hosp performed)     Status: None   Collection Time: 10/18/17  9:38 PM  Result Value Ref Range   Opiates NONE DETECTED NONE DETECTED   Cocaine NONE  DETECTED NONE DETECTED   Benzodiazepines NONE DETECTED NONE DETECTED   Amphetamines NONE DETECTED NONE DETECTED   Tetrahydrocannabinol NONE DETECTED NONE DETECTED   Barbiturates NONE DETECTED NONE DETECTED    Comment: (NOTE) DRUG SCREEN FOR MEDICAL PURPOSES ONLY.  IF CONFIRMATION IS NEEDED FOR ANY PURPOSE, NOTIFY LAB WITHIN 5 DAYS. LOWEST DETECTABLE LIMITS FOR URINE DRUG SCREEN Drug Class                     Cutoff (ng/mL) Amphetamine and metabolites    1000 Barbiturate and metabolites    200 Benzodiazepine                 810 Tricyclics and metabolites     300 Opiates and metabolites        300 Cocaine and metabolites        300 THC                            50 Performed at [facility], [place] [address], [place], [place] [address]   Urinalysis, Routine w reflex microscopic     Status: Abnormal   Collection Time: 10/18/17  9:38 PM  Result Value Ref Range   Color, Urine STRAW (A) YELLOW   APPearance HAZY (A) CLEAR   Specific Gravity, Urine 1.027 1.005 - 1.030   pH 5.0 5.0 - 8.0   Glucose, UA NEGATIVE NEGATIVE mg/dL   Hgb urine dipstick NEGATIVE NEGATIVE   Bilirubin Urine NEGATIVE NEGATIVE   Ketones, ur NEGATIVE  NEGATIVE mg/dL   Protein, ur NEGATIVE NEGATIVE mg/dL   Nitrite NEGATIVE NEGATIVE   Leukocytes, UA LARGE (A) NEGATIVE   RBC / HPF 0-5 0 - 5 RBC/hpf   WBC, UA 21-50 0 - 5 WBC/hpf   Bacteria, UA NONE SEEN NONE SEEN   Squamous Epithelial / LPF 0-5 0 - 5    Comment: Please note change in reference range.   Mucus PRESENT     Comment: Performed at [facility], [place] [address], [place], [place] [address]    Current Facility-Administered Medications  Medication Dose Route Frequency Provider Last Rate Last Dose  . benztropine (COGENTIN) tablet 0.5 mg  0.5 mg Oral BID Ethelene Hal, NP   0.5 mg at 10/19/17 0919  . buPROPion (WELLBUTRIN XL) 24 hr tablet 300 mg  300 mg Oral Daily Ethelene Hal, NP   300 mg at 10/19/17  6384  . hydrOXYzine (ATARAX/VISTARIL) tablet 50 mg  50 mg Oral TID Ethelene Hal, NP   50 mg at 10/19/17 0919  . LORazepam (ATIVAN) tablet 1 mg  1 mg Oral Q6H PRN Ethelene Hal, NP   1 mg at 10/18/17 2157  . risperiDONE (RISPERDAL) tablet 3 mg  3 mg Oral BID Ethelene Hal, NP   3 mg at 10/19/17 5364   Current Outpatient Medications  Medication Sig Dispense Refill  . buPROPion (WELLBUTRIN XL) 300 MG 24 hr tablet Take 300 mg daily by mouth.    Marland Kitchen LORazepam (ATIVAN) 1 MG tablet Take 1-2 mg 2 (two) times daily by mouth. Take 23m by mouth in the morning and take 251mby mouth at bedtime    . losartan (COZAAR) 50 MG tablet Take 1 tablet by mouth daily.    . risperiDONE (RISPERDAL) 3 MG tablet Take 3 mg 2 (two) times daily by mouth.    . benztropine (COGENTIN) 0.5 MG tablet Take 1 tablet (0.5 mg total) by mouth 2 (two) times daily. (Patient not taking: Reported on 10/18/2017) 60 tablet 2  . diphenhydrAMINE (BENADRYL) 25 mg capsule Take 1 capsule (25 mg total) by mouth 2 (two) times daily as needed for sleep (Extrapyramidal symptoms). (Patient not taking: Reported on 10/18/2017) 30 capsule 0  . folic acid (FOLVITE) 1 MG tablet Take 1 tablet (1 mg total) daily by mouth. (Patient not taking: Reported on 10/18/2017)    . Multiple Vitamin (MULTIVITAMIN WITH MINERALS) TABS tablet Take 1 tablet daily by mouth. (Patient not taking: Reported on 10/18/2017)    . thiamine 100 MG tablet Take 1 tablet (100 mg total) daily by mouth. (Patient not taking: Reported on 10/18/2017)      Musculoskeletal: Strength & Muscle Tone: within normal limits Gait & Station: normal Patient leans: N/A  Psychiatric Specialty Exam: Physical Exam  Nursing note and vitals reviewed. Constitutional: She is oriented to person, place, and time. She appears well-developed and well-nourished.  HENT:  Head: Normocephalic.  Neck: Normal range of motion.  Respiratory: Effort normal.  Musculoskeletal: Normal range of  motion.  Neurological: She is alert and oriented to person, place, and time.  Psychiatric: Her speech is normal and behavior is normal. Thought content normal. Her mood appears anxious. Cognition and memory are normal. She expresses impulsivity. She exhibits a depressed mood.    Review of Systems  Psychiatric/Behavioral: Negative for depression, hallucinations, memory loss, substance abuse and suicidal ideas. The patient is nervous/anxious. The patient does not have insomnia.   All other systems reviewed and are negative.  Blood pressure (!) 137/106, pulse 88, temperature 98.4 F (36.9 C), temperature source Oral, resp. rate 18, SpO2 100 %.There is no height or weight on file to calculate BMI.  General Appearance: Casual  Eye Contact:  Good  Speech:  Clear and Coherent  Volume:  Normal  Mood:  Anxious  Affect:  Congruent  Thought Process:  Coherent, Goal Directed and Linear  Orientation:  Full (Time, Place, and Person)  Thought Content:  Logical  Suicidal Thoughts:  No  Homicidal Thoughts:  No  Memory:  Immediate;   Good Recent;   Good Remote;   Fair  Judgement:  Fair  Insight:  Fair  Psychomotor Activity:  Normal  Concentration:  Concentration: Good and Attention Span: Good  Recall:  Good  Fund of Knowledge:  Good  Language:  Good  Akathisia:  No  Handed:  Right  AIMS (if indicated):   N/A  Assets:  Museum/gallery curator Resilience Social Support Transportation Vocational/Educational  ADL's:  Intact  Cognition:  WNL  Sleep:   N/A     Treatment Plan Summary: Plan Generalized Anxiety Disorder  Discharge home Follow up with [facility] Take all medications as prescribed  Disposition: No evidence of imminent risk to self or others at present.   Patient does not meet criteria for psychiatric inpatient admission. Supportive therapy provided about ongoing stressors. Discussed crisis plan, support from social  network, calling 911, coming to the Emergency Department, and calling Suicide Hotline.  Ethelene Hal, NP 10/19/2017 11:34 AM   Patient seen face-to-face for psychiatric evaluation, chart reviewed and case discussed with the physician extender and developed treatment plan. Reviewed the information documented and agree with the treatment plan.  Buford Dresser, DO 10/19/17 7:45 PM

## 2017-10-19 NOTE — ED Notes (Signed)
Pt cooperative, pleasant on approach. Compliant with morning medication regimen. Pt reports feeling better today. Pt denies SI/HI/AVH. Encouragement and support provided. Special checks q 15 mins in place for safety, Video monitoring in place. Will continue to monitor.

## 2017-10-19 NOTE — [facility] Suicide Risk Assessment (Cosign Needed)
Suicide Risk Assessment  Discharge Assessment   [facility] Discharge Suicide Risk Assessment   Principal Problem: GAD (generalized anxiety disorder) Discharge Diagnoses:  Patient Active Problem List   Diagnosis Date Noted  . GAD (generalized anxiety disorder) [F41.1] 10/19/2017  . UTI due to Klebsiella species [N39.0, B96.1]   . Hypernatremia [E87.0]   . Acute encephalopathy [G93.40] 05/03/2017  . ARF (acute renal failure) ([place]) [N17.9] 05/03/2017  . HCAP (healthcare-associated pneumonia) [J18.9] 09/25/2013  . Hypertension [I10] 09/25/2013  . Abnormal lithium level in blood [R78.89] 09/25/2013    Total Time spent with patient: 45 minutes  Musculoskeletal: Strength & Muscle Tone: within normal limits Gait & Station: normal Patient leans: N/A  Psychiatric Specialty Exam: Physical Exam  Constitutional: She is oriented to person, place, and time. She appears well-developed and well-nourished.  HENT:  Head: Normocephalic.  Respiratory: Effort normal.  Musculoskeletal: Normal range of motion.  Neurological: She is alert and oriented to person, place, and time.  Psychiatric: Her speech is normal and behavior is normal. Thought content normal. Her mood appears anxious. Cognition and memory are normal. She expresses impulsivity. She exhibits a depressed mood.   Review of Systems  Psychiatric/Behavioral: Negative for depression, hallucinations, memory loss, substance abuse and suicidal ideas. The patient is nervous/anxious. The patient does not have insomnia.   All other systems reviewed and are negative.  Blood pressure (!) 137/106, pulse 88, temperature 98.4 F (36.9 C), temperature source Oral, resp. rate 18, SpO2 100 %.There is no height or weight on file to calculate BMI. General Appearance: Casual Eye Contact:  Good Speech:  Clear and Coherent Volume:  Normal Mood:  Anxious Affect:  Congruent Thought Process:  Coherent, Goal Directed and Linear Orientation:  Full (Time, Place,  and Person) Thought Content:  Logical Suicidal Thoughts:  No Homicidal Thoughts:  No Memory:  Immediate;   Good Recent;   Good Remote;   Fair Judgement:  Fair Insight:  Fair Psychomotor Activity:  Normal Concentration:  Concentration: Good and Attention Span: Good Recall:  Good Fund of Knowledge:  Good Language:  Good Akathisia:  No Handed:  Right AIMS (if indicated):    Assets:  Museum/gallery curator Resilience Social Support Transportation Vocational/Educational ADL's:  Intact Cognition:  WNL   Mental Status Per Nursing Assessment::   On Admission:   Anxious  Demographic Factors:  Low socioeconomic status, Living alone and Unemployed  Loss Factors: Financial problems/change in socioeconomic status  Historical Factors: Impulsivity  Risk Reduction Factors:   Sense of responsibility to family  Continued Clinical Symptoms:  Severe Anxiety and/or Agitation Depression:   Impulsivity  Cognitive Features That Contribute To Risk:  Closed-mindedness    Suicide Risk:  Minimal: No identifiable suicidal ideation.  Patients presenting with no risk factors but with morbid ruminations; may be classified as minimal risk based on the severity of the depressive symptoms    Plan Of Care/Follow-up recommendations:  Activity:  as tolerated  Diet:  Heart Healthy  Ethelene Hal, NP 10/19/2017, 11:51 AM

## 2017-10-19 NOTE — Discharge Instructions (Signed)
For your behavioral health needs you are advised to follow up with [facility].  New patients are seen at their walk-in clinic.  Walk-in hours are Monday - Friday from 8:00 am - 12:00 pm, and from 1:00 pm - 3:00 pm.  Walk-in patients are seen on a first come, first served basis, so try to arrive as early as possible for the best chance of being seen the same day.  There is an initial fee of $22.50: ° °     [facility] °     [address] °     [place], [place] [address] °     [phone] °

## 2017-10-19 NOTE — ED Notes (Signed)
Pt d/c home per MD order. Discharge summary reviewed with pt, pt verbalizes understanding. Pt denies SI/HI/AVH. Personal property returned to pt. Pt signed e-signature, ambulatory off unit with [facility]HT.

## 2017-10-19 NOTE — BH Assessment (Signed)
[facility] Assessment Progress Note  Per Buford Dresser, DO, this pt does not require psychiatric hospitalization at this time.  Pt is to be discharged from [facility] with recommendation to follow up with [facility].  This has been included in pt's discharge instructions.  Pt's nurse, Caryl Pina, has been notified.  Jalene Mullet, [place] Triage Specialist [phone]

## 2017-12-03 ENCOUNTER — Other Ambulatory Visit: Payer: Self-pay

## 2017-12-03 ENCOUNTER — Observation Stay (HOSPITAL_COMMUNITY)
Admission: EM | Admit: 2017-12-03 | Discharge: 2017-12-04 | Disposition: A | Discharge status: Home / Self Care | Payer: Medicaid Other | Attending: Internal Medicine | Admitting: Internal Medicine

## 2017-12-03 ENCOUNTER — Encounter (HOSPITAL_COMMUNITY): Payer: Self-pay

## 2017-12-03 ENCOUNTER — Emergency Department (HOSPITAL_COMMUNITY): Payer: Medicaid Other

## 2017-12-03 DIAGNOSIS — I10 Essential (primary) hypertension: Secondary | ICD-10-CM | POA: Diagnosis not present

## 2017-12-03 DIAGNOSIS — E870 Hyperosmolality and hypernatremia: Secondary | ICD-10-CM | POA: Insufficient documentation

## 2017-12-03 DIAGNOSIS — N3000 Acute cystitis without hematuria: Secondary | ICD-10-CM

## 2017-12-03 DIAGNOSIS — N390 Urinary tract infection, site not specified: Secondary | ICD-10-CM

## 2017-12-03 DIAGNOSIS — F259 Schizoaffective disorder, unspecified: Secondary | ICD-10-CM | POA: Insufficient documentation

## 2017-12-03 DIAGNOSIS — F411 Generalized anxiety disorder: Secondary | ICD-10-CM | POA: Diagnosis not present

## 2017-12-03 DIAGNOSIS — R000 Tachycardia, unspecified: Principal | ICD-10-CM | POA: Diagnosis present

## 2017-12-03 DIAGNOSIS — F13230 Sedative, hypnotic or anxiolytic dependence with withdrawal, uncomplicated: Secondary | ICD-10-CM

## 2017-12-03 DIAGNOSIS — T424X6A Underdosing of benzodiazepines, initial encounter: Secondary | ICD-10-CM | POA: Insufficient documentation

## 2017-12-03 DIAGNOSIS — F419 Anxiety disorder, unspecified: Secondary | ICD-10-CM | POA: Diagnosis present

## 2017-12-03 DIAGNOSIS — Z91128 Patient's intentional underdosing of medication regimen for other reason: Secondary | ICD-10-CM | POA: Diagnosis not present

## 2017-12-03 DIAGNOSIS — F13930 Sedative, hypnotic or anxiolytic use, unspecified with withdrawal, uncomplicated: Secondary | ICD-10-CM

## 2017-12-03 LAB — COMPREHENSIVE METABOLIC PANEL
ALT: 15 U/L (ref 14–54)
AST: 15 U/L (ref 15–41)
Albumin: 5 g/dL (ref 3.5–5.0)
Alkaline Phosphatase: 100 U/L (ref 38–126)
Anion gap: 16 — ABNORMAL HIGH (ref 5–15)
BUN: 13 mg/dL (ref 6–20)
CO2: 23 mmol/L (ref 22–32)
Calcium: 10.9 mg/dL — ABNORMAL HIGH (ref 8.9–10.3)
Chloride: 108 mmol/L (ref 101–111)
Creatinine, Ser: 1.29 mg/dL — ABNORMAL HIGH (ref 0.44–1.00)
GFR calc Af Amer: 53 mL/min — ABNORMAL LOW (ref >60)
GFR calc non Af Amer: 45 mL/min — ABNORMAL LOW (ref >60)
Glucose, Bld: 84 mg/dL (ref 65–99)
Potassium: 3.3 mmol/L — ABNORMAL LOW (ref 3.5–5.1)
Sodium: 147 mmol/L — ABNORMAL HIGH (ref 135–145)
Total Bilirubin: 1.2 mg/dL (ref 0.3–1.2)
Total Protein: 8.2 g/dL — ABNORMAL HIGH (ref 6.5–8.1)

## 2017-12-03 LAB — CBC WITH DIFFERENTIAL/PLATELET
Basophils Absolute: 0 10*3/uL (ref 0.0–0.1)
Basophils Relative: 0 %
Eosinophils Absolute: 0 10*3/uL (ref 0.0–0.7)
Eosinophils Relative: 0 %
HCT: 35.2 % — ABNORMAL LOW (ref 36.0–46.0)
Hemoglobin: 11.6 g/dL — ABNORMAL LOW (ref 12.0–15.0)
Lymphocytes Relative: 10 %
Lymphs Abs: 1.2 10*3/uL (ref 0.7–4.0)
MCH: 26.8 pg (ref 26.0–34.0)
MCHC: 33 g/dL (ref 30.0–36.0)
MCV: 81.3 fL (ref 78.0–100.0)
Monocytes Absolute: 0.9 10*3/uL (ref 0.1–1.0)
Monocytes Relative: 8 %
Neutro Abs: 9.8 10*3/uL — ABNORMAL HIGH (ref 1.7–7.7)
Neutrophils Relative %: 82 %
Platelets: 257 10*3/uL (ref 150–400)
RBC: 4.33 MIL/uL (ref 3.87–5.11)
RDW: 13.2 % (ref 11.5–15.5)
WBC: 11.9 10*3/uL — ABNORMAL HIGH (ref 4.0–10.5)

## 2017-12-03 LAB — ETHANOL: Alcohol, Ethyl (B): <10 mg/dL (ref <10)

## 2017-12-03 LAB — URINALYSIS, ROUTINE W REFLEX MICROSCOPIC
Bilirubin Urine: NEGATIVE
Glucose, UA: NEGATIVE mg/dL
Ketones, ur: 5 mg/dL — AB
Nitrite: NEGATIVE
Protein, ur: NEGATIVE mg/dL
Specific Gravity, Urine: 1.003 — ABNORMAL LOW (ref 1.005–1.030)
pH: 6 (ref 5.0–8.0)

## 2017-12-03 LAB — I-STAT TROPONIN, ED
Troponin i, poc: 0.01 ng/mL (ref 0.00–0.08)
Troponin i, poc: 0.02 ng/mL (ref 0.00–0.08)

## 2017-12-03 LAB — CK: Total CK: 47 U/L (ref 38–234)

## 2017-12-03 LAB — RAPID URINE DRUG SCREEN, HOSP PERFORMED
Amphetamines: NOT DETECTED
Barbiturates: NOT DETECTED
Benzodiazepines: NOT DETECTED
Cocaine: NOT DETECTED
Opiates: NOT DETECTED
Tetrahydrocannabinol: NOT DETECTED

## 2017-12-03 LAB — LITHIUM LEVEL: Lithium Lvl: <0.06 mmol/L — ABNORMAL LOW (ref 0.60–1.20)

## 2017-12-03 LAB — CBG MONITORING, ED: GLUCOSE-CAPILLARY: 94 mg/dL (ref 65–99)

## 2017-12-03 LAB — SALICYLATE LEVEL: Salicylate Lvl: <7 mg/dL (ref 2.8–30.0)

## 2017-12-03 LAB — AMMONIA: Ammonia: 19 umol/L (ref 9–35)

## 2017-12-03 LAB — ACETAMINOPHEN LEVEL: Acetaminophen (Tylenol), Serum: <10 ug/mL — ABNORMAL LOW (ref 10–30)

## 2017-12-03 MED ORDER — VITAMIN B-1 100 MG PO TABS
100.0000 mg | ORAL_TABLET | Freq: Every day | ORAL | Status: DC
  Administered 2017-12-04: 100 mg via ORAL
  Filled 2017-12-03: qty 1

## 2017-12-03 MED ORDER — ACETAMINOPHEN 325 MG PO TABS: 650.0000 mg | ORAL_TABLET | Freq: Four times a day (QID) | ORAL | Status: DC | PRN

## 2017-12-03 MED ORDER — DIPHENHYDRAMINE HCL 25 MG PO CAPS: 25.0000 mg | ORAL_CAPSULE | Freq: Two times a day (BID) | ORAL | Status: DC | PRN

## 2017-12-03 MED ORDER — ACETAMINOPHEN 650 MG RE SUPP: 650.0000 mg | Freq: Four times a day (QID) | RECTAL | Status: DC | PRN

## 2017-12-03 MED ORDER — LOSARTAN POTASSIUM 50 MG PO TABS
50.0000 mg | ORAL_TABLET | Freq: Every day | ORAL | Status: DC
  Administered 2017-12-04: 50 mg via ORAL
  Filled 2017-12-03: qty 1

## 2017-12-03 MED ORDER — SODIUM CHLORIDE 0.9 % IV BOLUS
1000.0000 mL | Freq: Once | INTRAVENOUS | Status: AC
  Administered 2017-12-03: 1000 mL via INTRAVENOUS

## 2017-12-03 MED ORDER — LORAZEPAM 1 MG PO TABS
1.0000 mg | ORAL_TABLET | Freq: Every day | ORAL | Status: DC
  Administered 2017-12-04: 1 mg via ORAL
  Filled 2017-12-03: qty 1

## 2017-12-03 MED ORDER — ONDANSETRON HCL 4 MG/2ML IJ SOLN: 4.0000 mg | Freq: Four times a day (QID) | INTRAMUSCULAR | Status: DC | PRN

## 2017-12-03 MED ORDER — BENZTROPINE MESYLATE 0.5 MG PO TABS
0.5000 mg | ORAL_TABLET | Freq: Two times a day (BID) | ORAL | Status: DC
  Administered 2017-12-04 (×2): 0.5 mg via ORAL
  Filled 2017-12-03 (×2): qty 1

## 2017-12-03 MED ORDER — ONDANSETRON HCL 4 MG PO TABS: 4.0000 mg | ORAL_TABLET | Freq: Four times a day (QID) | ORAL | Status: DC | PRN

## 2017-12-03 MED ORDER — ENOXAPARIN SODIUM 40 MG/0.4ML ~~LOC~~ SOLN
40.0000 mg | Freq: Every day | SUBCUTANEOUS | Status: DC
  Administered 2017-12-04: 40 mg via SUBCUTANEOUS
  Filled 2017-12-03: qty 0.4

## 2017-12-03 MED ORDER — CEFTRIAXONE SODIUM 1 G IJ SOLR
1.0000 g | INTRAMUSCULAR | Status: DC
  Administered 2017-12-03: 1 g via INTRAVENOUS
  Filled 2017-12-03 (×2): qty 10

## 2017-12-03 MED ORDER — FOLIC ACID 1 MG PO TABS
1.0000 mg | ORAL_TABLET | Freq: Every day | ORAL | Status: DC
  Administered 2017-12-04: 1 mg via ORAL
  Filled 2017-12-03: qty 1

## 2017-12-03 MED ORDER — LORAZEPAM 2 MG/ML IJ SOLN
1.0000 mg | Freq: Once | INTRAMUSCULAR | Status: AC
  Administered 2017-12-03: 1 mg via INTRAVENOUS
  Filled 2017-12-03: qty 1

## 2017-12-03 MED ORDER — BUPROPION HCL ER (XL) 150 MG PO TB24
300.0000 mg | ORAL_TABLET | Freq: Every day | ORAL | Status: DC
  Administered 2017-12-04: 300 mg via ORAL
  Filled 2017-12-03: qty 2

## 2017-12-03 MED ORDER — LORAZEPAM 1 MG PO TABS
2.0000 mg | ORAL_TABLET | Freq: Every day | ORAL | Status: DC
  Administered 2017-12-03: 2 mg via ORAL
  Filled 2017-12-03: qty 2

## 2017-12-03 MED ORDER — RISPERIDONE 2 MG PO TABS
3.0000 mg | ORAL_TABLET | Freq: Two times a day (BID) | ORAL | Status: DC
  Administered 2017-12-04 (×2): 3 mg via ORAL
  Filled 2017-12-03 (×2): qty 1

## 2017-12-03 NOTE — ED Triage Notes (Signed)
Per EMS-called by patient stating she is out of her psych meds-states she is having mental issues for 2 weeks-acted like she was unconsciousness when EMS arrived-patient lives by herself in [place] [address]-states she is having a "mental issue"

## 2017-12-03 NOTE — ED Provider Notes (Signed)
[facility] DEPT Provider Note   CSN: [id number] Arrival date & time: 12/03/17  1301     History   Chief Complaint Chief Complaint  Patient presents with  . Medication Refill    HPI Tara Hurst is a 57 y.o. female with history of hypertension, schizoaffective disorder, generalized anxiety disorder, hypernatremia, acute encephalopathy presents for evaluation of altered mental status.  She is slow to answer questions and states she feels confused and has some difficulty with her memory.  She also tells me she feels as though it is difficult to speak and "find my words ".  She tells me that she has been out of her medications for the past 2 weeks including Ativan which she takes twice daily.  She does tell me she has been compliant with her blood pressure medicines but is unsure which ones she has been taking.  She endorses substernal chest pain and shortness of breath but is unable to describe the pain.  She was apparently acting as though she was unconscious when EMS arrived.  She denies suicidal ideation, homicidal ideation.  When asked about auditory or visual hallucinations she becomes quiet and will not answer.  No prior history of DVT or PE.  She is unable to contribute to her history further.  The history is provided by the patient.    Past Medical History:  Diagnosis Date  . Hypertension   . Schizoaffective disorder [facility])     Patient Active Problem List   Diagnosis Date Noted  . Tachycardia 12/03/2017  . Acute lower UTI 12/03/2017  . GAD (generalized anxiety disorder) 10/19/2017  . Anxiety   . UTI due to Klebsiella species   . Hypernatremia   . Acute encephalopathy 05/03/2017  . ARF (acute renal failure) ([place]) 05/03/2017  . HCAP (healthcare-associated pneumonia) 09/25/2013  . Hypertension 09/25/2013  . Abnormal lithium level in blood 09/25/2013    History reviewed. No pertinent surgical history.   OB History   None      Home  Medications    Prior to Admission medications   Medication Sig Start Date End Date Taking? Authorizing Provider  benztropine (COGENTIN) 0.5 MG tablet Take 1 tablet (0.5 mg total) by mouth 2 (two) times daily. Patient not taking: Reported on 10/18/2017 09/28/13   Kinnie Feil, MD  buPROPion (WELLBUTRIN XL) 300 MG 24 hr tablet Take 300 mg daily by mouth.    [provider]  diphenhydrAMINE (BENADRYL) 25 mg capsule Take 1 capsule (25 mg total) by mouth 2 (two) times daily as needed for sleep (Extrapyramidal symptoms). Patient not taking: Reported on 10/18/2017 09/28/13   Kinnie Feil, MD  folic acid (FOLVITE) 1 MG tablet Take 1 tablet (1 mg total) daily by mouth. Patient not taking: Reported on 10/18/2017 05/11/17   Eugenie Filler, MD  LORazepam (ATIVAN) 1 MG tablet Take 1-2 mg 2 (two) times daily by mouth. Take 1mg  by mouth in the morning and take 2mg  by mouth at bedtime    [provider]  losartan (COZAAR) 50 MG tablet Take 1 tablet by mouth daily.    [provider]  Multiple Vitamin (MULTIVITAMIN WITH MINERALS) TABS tablet Take 1 tablet daily by mouth. Patient not taking: Reported on 10/18/2017 05/11/17   Eugenie Filler, MD  risperiDONE (RISPERDAL) 3 MG tablet Take 3 mg 2 (two) times daily by mouth.    [provider]  thiamine 100 MG tablet Take 1 tablet (100 mg total) daily by  mouth. Patient not taking: Reported on 10/18/2017 05/11/17   Eugenie Filler, MD    Family History Family History  Problem Relation Age of Onset  . Heart disease Mother   . Diabetic kidney disease Mother     Social History Social History   Tobacco Use  . Smoking status: Never Smoker  . Smokeless tobacco: Never Used  Substance Use Topics  . Alcohol use: No  . Drug use: No     Allergies   Patient has no known allergies.   Review of Systems Review of Systems  Unable to perform ROS: Psychiatric disorder  Constitutional: Negative for chills and fever.    Respiratory: Positive for shortness of breath.   Cardiovascular: Positive for chest pain.  Neurological: Positive for speech difficulty.  Psychiatric/Behavioral: The patient is nervous/anxious.      Physical Exam Updated Vital Signs BP (!) 158/105   Pulse 93   Temp 99 F (37.2 C) (Rectal)   Resp 16   Ht 5\' 4"  (1.626 m)   Wt 70.3 kg (155 lb)   SpO2 100%   BMI 26.61 kg/m   Physical Exam  Constitutional: She is oriented to person, place, and time. She appears well-developed and well-nourished. No distress.  HENT:  Head: Normocephalic and atraumatic.  Eyes: Conjunctivae are normal. Right eye exhibits no discharge. Left eye exhibits no discharge.  Neck: No JVD present. No tracheal deviation present.  Cardiovascular:  Tachycardic, 2+ radial and DP/PT pulses bl, negative Homan's bl, no LE edema  Pulmonary/Chest: Effort normal.  Globally diminished breath sounds, mildly tachypneic.  Speaking in short phrases although this is likely due to her psychiatric status.  Abdominal: Soft. Bowel sounds are normal. She exhibits no distension. There is no tenderness. There is no guarding.  Musculoskeletal: She exhibits no edema.  Neurological: She is alert and oriented to person, place, and time.  Neurologic exam is somewhat variable.  Patient will answer some questions but not others.  She is slow to answer questions.  She will not raise her eyebrows but is able to smile and has normal tongue protrusion.  Sensation is intact to soft touch of extremities and face.  She has tremulous bilateral upper extremities at rest and tells me this is chronic.  She tells me her arms feel heavy and is unable to actively lift them and I am unable to assess if she has a pronator drift.  5/5 strength of BLE major muscle groups and she has good grip strength bilaterally.  She exhibits waxy flexibility  Skin: Skin is warm and dry. No erythema.  Psychiatric: Her affect is blunt. Her speech is delayed. She is withdrawn.   Flat affect  Nursing note and vitals reviewed.    ED Treatments / Results  Labs (all labs ordered are listed, but only abnormal results are displayed) Labs Reviewed  CBC WITH DIFFERENTIAL/PLATELET - Abnormal; Notable for the following components:      Result Value   WBC 11.9 (*)    Hemoglobin 11.6 (*)    HCT 35.2 (*)    Neutro Abs 9.8 (*)    All other components within normal limits  COMPREHENSIVE METABOLIC PANEL - Abnormal; Notable for the following components:   Sodium 147 (*)    Potassium 3.3 (*)    Creatinine, Ser 1.29 (*)    Calcium 10.9 (*)    Total Protein 8.2 (*)    GFR calc non Af Amer 45 (*)    GFR calc Af Amer 53 (*)  Anion gap 16 (*)    All other components within normal limits  ACETAMINOPHEN LEVEL - Abnormal; Notable for the following components:   Acetaminophen (Tylenol), Serum <10 (*)    All other components within normal limits  LITHIUM LEVEL - Abnormal; Notable for the following components:   Lithium Lvl <0.06 (*)    All other components within normal limits  URINALYSIS, ROUTINE W REFLEX MICROSCOPIC - Abnormal; Notable for the following components:   Color, Urine STRAW (*)    Specific Gravity, Urine 1.003 (*)    Hgb urine dipstick SMALL (*)    Ketones, ur 5 (*)    Leukocytes, UA LARGE (*)    Bacteria, UA RARE (*)    All other components within normal limits  URINE CULTURE  ETHANOL  SALICYLATE LEVEL  AMMONIA  RAPID URINE DRUG SCREEN, HOSP PERFORMED  CK  BASIC METABOLIC PANEL  CBG MONITORING, ED  I-STAT TROPONIN, ED  I-STAT TROPONIN, ED    EKG EKG Interpretation  Date/Time:  Monday December 03 2017 13:21:01 EDT Ventricular Rate:  127 PR Interval:    QRS Duration: 73 QT Interval:  299 QTC Calculation: 435 R Axis:   58 Text Interpretation:  Sinus tachycardia LAE, consider biatrial enlargement Abnormal T, consider ischemia, lateral leads Baseline wander in lead(s) I II aVR aVL aVF Confirmed by Dene Gentry [phone]) on 12/03/2017 3:46:22  PM   Radiology Ct Head Wo Contrast  Result Date: 12/03/2017 CLINICAL DATA:  Mental issues per the patient. EXAM: CT HEAD WITHOUT CONTRAST TECHNIQUE: Contiguous axial images were obtained from the base of the skull through the vertex without intravenous contrast. COMPARISON:  October 18, 2017 FINDINGS: Brain: No evidence of acute infarction, hemorrhage, hydrocephalus, extra-axial collection or mass lesion/mass effect. Vascular: No hyperdense vessel or unexpected calcification. Skull: Normal. Negative for fracture or focal lesion. Sinuses/Orbits: No acute finding. Other: None. IMPRESSION: No cause for the patient's symptoms identified. Electronically Signed   By: Dorise Bullion III M.D   On: 12/03/2017 16:12    Procedures Procedures (including critical care time)  Medications Ordered in ED Medications  LORazepam (ATIVAN) tablet 2 mg (2 mg Oral Given 6/54/65 0354)  folic acid (FOLVITE) tablet 1 mg (has no administration in time range)  risperiDONE (RISPERDAL) tablet 3 mg (has no administration in time range)  thiamine (VITAMIN B-1) tablet 100 mg (has no administration in time range)  buPROPion (WELLBUTRIN XL) 24 hr tablet 300 mg (has no administration in time range)  benztropine (COGENTIN) tablet 0.5 mg (has no administration in time range)  diphenhydrAMINE (BENADRYL) capsule 25 mg (has no administration in time range)  losartan (COZAAR) tablet 50 mg (has no administration in time range)  acetaminophen (TYLENOL) tablet 650 mg (has no administration in time range)    Or  acetaminophen (TYLENOL) suppository 650 mg (has no administration in time range)  ondansetron (ZOFRAN) tablet 4 mg (has no administration in time range)    Or  ondansetron (ZOFRAN) injection 4 mg (has no administration in time range)  enoxaparin (LOVENOX) injection 40 mg (has no administration in time range)  cefTRIAXone (ROCEPHIN) 1 g in sodium chloride 0.9 % 100 mL IVPB (1 g Intravenous New Bag/Given 12/03/17 2318)   LORazepam (ATIVAN) tablet 1 mg (has no administration in time range)  sodium chloride 0.9 % bolus 1,000 mL (0 mLs Intravenous Stopped 12/03/17 1608)  LORazepam (ATIVAN) injection 1 mg (1 mg Intravenous Given 12/03/17 1538)  sodium chloride 0.9 % bolus 1,000 mL (0 mLs Intravenous Stopped 12/03/17 2043)  LORazepam (ATIVAN) injection 1 mg (1 mg Intravenous Given 12/03/17 2011)     Initial Impression / Assessment and Plan / ED Course  I have reviewed the triage vital signs and the nursing notes.  Pertinent labs & imaging results that were available during my care of the patient were reviewed by me and considered in my medical decision making (see chart for details).     Patient presents for evaluation of altered mental status.  She is persistently tachycardic, initially quite hypertensive with improvement while in the ED.  It is difficult to get an exam from her initially but I am able to ascertain that she has not had her home medicines for some time including her Ativan.  She did have a mild temporary improvement with Ativan and IV fluids but on reevaluation she does remain persistently tachycardic.  Her heart rate will increase to around 125 bpm when ambulated.  EKG shows sinus tachycardia and some nonspecific T wave abnormalities.  Serial troponins are negative and I doubt ACS or MI.  Remainder of lab work reviewed by me shows a mild nonspecific leukocytosis, mild hyponatremia of 147 elevated creatinine at the patient's baseline of 1.29, mild hypokalemia.  She does have an elevated anion gap of 16.  UA is suggestive of UTI, we will send for culture.  CT of head shows no acute intracranial abnormalities.  On reevaluation the patient is resting comfortably, remains persistently tachycardic.  She has a tremor at rest.  She tells me that she does not have reliable transportation and has not been able to get her home medicines for 2 weeks as she has been at home and unable to go to the pharmacy.  She also  tells me that her apartment is currently locked and she is unable to return to it until tomorrow morning when her landlord is on the premises of her apartment complex.  Given persistent tachycardia despite IV fluid rehydration and benzodiazepine administration with concurrent UTI, she will require admission.  Spoke with Dr. Cleda Daub with [facility] who agrees to assume care patient bring her into the hospital for further evaluation and management.  Final Clinical Impressions(s) / ED Diagnoses   Final diagnoses:  Benzodiazepine withdrawal without complication [facility])  Acute cystitis without hematuria    ED Discharge Orders    None       Debroah Baller 12/03/17 2329    Valarie Merino, MD 12/05/17 1445

## 2017-12-03 NOTE — H&P (Addendum)
History and Physical    Tara Hurst TDD:[id number] DOB: Apr 24, 1961 DOA: 12/03/2017  PCP: [name], MD  Patient coming from: Home  I have personally briefly reviewed patient's old medical records in [place]  Chief Complaint: Med refill  HPI: Tara Hurst is a 57 y.o. female with medical history significant of hypertension, schizoaffective disorder, generalized anxiety disorder, hypernatremia, acute encephalopathy presents for evaluation of altered mental status.  She is slow to answer questions and states she feels confused and has some difficulty with her memory.  She also tells me she feels as though it is difficult to speak and "find my words ".  She tells me that she has been out of her medications for the past 2 weeks including Ativan which she takes twice daily.  She does tell me she has been compliant with her blood pressure medicines but is unsure which ones she has been taking.  She endorses substernal chest pain and shortness of breath but is unable to describe the pain.  She was apparently acting as though she was unconscious when EMS arrived.  She denies suicidal ideation, homicidal ideation.  When asked about auditory or visual hallucinations she becomes quiet and will not answer.  No prior history of DVT or PE.     ED Course: Patient now back to baseline mental status after 2mg  ativan and 2 IVF.  Tachycardia improved except when she stands up still has mild tachycardia.  EDP wants ON admit for obs.  Review of Systems: As per HPI otherwise 10 point review of systems negative.   Past Medical History:  Diagnosis Date  . Hypertension   . Schizoaffective disorder ([place])     History reviewed. No pertinent surgical history.   reports that she has never smoked. She has never used smokeless tobacco. She reports that she does not drink alcohol or use drugs.  No Known Allergies  Family History  Problem Relation Age of Onset  . Heart disease Mother   .  Diabetic kidney disease Mother      Prior to Admission medications   Medication Sig Start Date End Date Taking? Authorizing Provider  benztropine (COGENTIN) 0.5 MG tablet Take 1 tablet (0.5 mg total) by mouth 2 (two) times daily. Patient not taking: Reported on 10/18/2017 09/28/13   Kinnie Feil, MD  buPROPion (WELLBUTRIN XL) 300 MG 24 hr tablet Take 300 mg daily by mouth.    [provider]  diphenhydrAMINE (BENADRYL) 25 mg capsule Take 1 capsule (25 mg total) by mouth 2 (two) times daily as needed for sleep (Extrapyramidal symptoms). Patient not taking: Reported on 10/18/2017 09/28/13   Kinnie Feil, MD  folic acid (FOLVITE) 1 MG tablet Take 1 tablet (1 mg total) daily by mouth. Patient not taking: Reported on 10/18/2017 05/11/17   Eugenie Filler, MD  LORazepam (ATIVAN) 1 MG tablet Take 1-2 mg 2 (two) times daily by mouth. Take 1mg  by mouth in the morning and take 2mg  by mouth at bedtime    [provider]  losartan (COZAAR) 50 MG tablet Take 1 tablet by mouth daily.    [provider]  Multiple Vitamin (MULTIVITAMIN WITH MINERALS) TABS tablet Take 1 tablet daily by mouth. Patient not taking: Reported on 10/18/2017 05/11/17   Eugenie Filler, MD  risperiDONE (RISPERDAL) 3 MG tablet Take 3 mg 2 (two) times daily by mouth.    [provider]  thiamine 100 MG tablet Take 1 tablet (100 mg total) daily  by mouth. Patient not taking: Reported on 10/18/2017 05/11/17   Eugenie Filler, MD    Physical Exam: Vitals:   12/03/17 1830 12/03/17 1930 12/03/17 2030 12/03/17 2113  BP: (!) 124/102 (!) 144/82 (!) 138/97   Pulse: (!) 101  (!) 104   Resp: 15 (!) 21 14   Temp:    99 F (37.2 C)  TempSrc:    Rectal  SpO2: 100% 100% 100%   Weight:      Height:        Constitutional: NAD, calm, comfortable Eyes: PERRL, lids and conjunctivae normal ENMT: Mucous membranes are moist. Posterior pharynx clear of any exudate or lesions.Normal dentition.  Neck:  normal, supple, no masses, no thyromegaly Respiratory: clear to auscultation bilaterally, no wheezing, no crackles. Normal respiratory effort. No accessory muscle use.  Cardiovascular: Regular rate and rhythm, no murmurs / rubs / gallops. No extremity edema. 2+ pedal pulses. No carotid bruits.  Abdomen: no tenderness, no masses palpated. No hepatosplenomegaly. Bowel sounds positive.  Musculoskeletal: no clubbing / cyanosis. No joint deformity upper and lower extremities. Good ROM, no contractures. Normal muscle tone.  Skin: no rashes, lesions, ulcers. No induration Neurologic: CN 2-12 grossly intact. Sensation intact, DTR normal. Strength 5/5 in all 4. Tremor which patient states is chronic and baseline. Psychiatric: Normal judgment and insight. Alert and oriented x 3. Normal mood.    Labs on Admission: I have personally reviewed following labs and imaging studies  CBC: Recent Labs  Lab 12/03/17 1526  WBC 11.9*  NEUTROABS 9.8*  HGB 11.6*  HCT 35.2*  MCV 81.3  PLT 169   Basic Metabolic Panel: Recent Labs  Lab 12/03/17 1526  NA 147*  K 3.3*  CL 108  CO2 23  GLUCOSE 84  BUN 13  CREATININE 1.29*  CALCIUM 10.9*   GFR: Estimated Creatinine Clearance: 46.8 mL/min (A) (by C-G formula based on SCr of 1.29 mg/dL (H)). Liver Function Tests: Recent Labs  Lab 12/03/17 1526  AST 15  ALT 15  ALKPHOS 100  BILITOT 1.2  PROT 8.2*  ALBUMIN 5.0   No results for input(s): LIPASE, AMYLASE in the last 168 hours. Recent Labs  Lab 12/03/17 1526  AMMONIA 19   Coagulation Profile: No results for input(s): INR, PROTIME in the last 168 hours. Cardiac Enzymes: Recent Labs  Lab 12/03/17 2122  CKTOTAL 47   BNP (last 3 results) No results for input(s): PROBNP in the last 8760 hours. HbA1C: No results for input(s): HGBA1C in the last 72 hours. CBG: Recent Labs  Lab 12/03/17 1409  GLUCAP 94   Lipid Profile: No results for input(s): CHOL, HDL, LDLCALC, TRIG, CHOLHDL, LDLDIRECT  in the last 72 hours. Thyroid Function Tests: No results for input(s): TSH, T4TOTAL, FREET4, T3FREE, THYROIDAB in the last 72 hours. Anemia Panel: No results for input(s): VITAMINB12, FOLATE, FERRITIN, TIBC, IRON, RETICCTPCT in the last 72 hours. Urine analysis:    Component Value Date/Time   COLORURINE STRAW (A) 12/03/2017 1540   APPEARANCEUR CLEAR 12/03/2017 1540   LABSPEC 1.003 (L) 12/03/2017 1540   PHURINE 6.0 12/03/2017 1540   GLUCOSEU NEGATIVE 12/03/2017 1540   HGBUR SMALL (A) 12/03/2017 1540   BILIRUBINUR NEGATIVE 12/03/2017 1540   KETONESUR 5 (A) 12/03/2017 1540   PROTEINUR NEGATIVE 12/03/2017 1540   UROBILINOGEN 0.2 09/25/2013 1322   NITRITE NEGATIVE 12/03/2017 1540   LEUKOCYTESUR LARGE (A) 12/03/2017 1540    Radiological Exams on Admission: Ct Head Wo Contrast  Result Date: 12/03/2017 CLINICAL DATA:  Mental  issues per the patient. EXAM: CT HEAD WITHOUT CONTRAST TECHNIQUE: Contiguous axial images were obtained from the base of the skull through the vertex without intravenous contrast. COMPARISON:  October 18, 2017 FINDINGS: Brain: No evidence of acute infarction, hemorrhage, hydrocephalus, extra-axial collection or mass lesion/mass effect. Vascular: No hyperdense vessel or unexpected calcification. Skull: Normal. Negative for fracture or focal lesion. Sinuses/Orbits: No acute finding. Other: None. IMPRESSION: No cause for the patient's symptoms identified. Electronically Signed   By: Dorise Bullion III M.D   On: 12/03/2017 16:12    EKG: Independently reviewed.  Assessment/Plan Principal Problem:   Tachycardia Active Problems:   Hypertension   Anxiety   Acute lower UTI    1. Tachycardia - 1. Likely Benzo withdrawal - see Nov admit 2. Marked improvement post benzos, now HR 90s at rest 3. ON obs on tele monitor 2. UTI - 1. Rocephin 2. Culture pending 3. Anxiety, schizoaffective disorder - 1. Resume home meds 4. HTN - resume losartan 5. Hypernatremia - probably  mild dehydration as it was last time she needed admit after running out of psych meds in Nov.  DVT prophylaxis: Lovenox Code Status: Full Family Communication: No family in room Disposition Plan: Home after admit Consults called: None Admission status: Place in [place], [place] [facility] Pager [phone]  If 7AM-7PM, please contact day team taking care of patient [email or web address] Password [email or web address]  12/03/2017, 10:12 PM

## 2017-12-03 NOTE — ED Triage Notes (Signed)
Patient states she lives alone. Patient also stated that she needed medication refilled. Patient is tearful, not answering questions completely and has delay when answering questions in a timely manner. Patient states she normally walks around in her apartment by herself.

## 2017-12-04 ENCOUNTER — Other Ambulatory Visit: Payer: Self-pay

## 2017-12-04 DIAGNOSIS — N3000 Acute cystitis without hematuria: Secondary | ICD-10-CM

## 2017-12-04 DIAGNOSIS — R000 Tachycardia, unspecified: Secondary | ICD-10-CM

## 2017-12-04 DIAGNOSIS — F13230 Sedative, hypnotic or anxiolytic dependence with withdrawal, uncomplicated: Secondary | ICD-10-CM

## 2017-12-04 LAB — BASIC METABOLIC PANEL
ANION GAP: 10 (ref 5–15)
BUN: 12 mg/dL (ref 6–20)
CHLORIDE: 113 mmol/L — AB (ref 101–111)
CO2: 25 mmol/L (ref 22–32)
Calcium: 9.8 mg/dL (ref 8.9–10.3)
Creatinine, Ser: 1.15 mg/dL — ABNORMAL HIGH (ref 0.44–1.00)
GFR calc non Af Amer: 52 mL/min — ABNORMAL LOW (ref >60)
GLUCOSE: 85 mg/dL (ref 65–99)
POTASSIUM: 3.4 mmol/L — AB (ref 3.5–5.1)
Sodium: 148 mmol/L — ABNORMAL HIGH (ref 135–145)

## 2017-12-04 MED ORDER — LORAZEPAM 1 MG PO TABS: ORAL_TABLET | ORAL | 0 refills | Status: DC

## 2017-12-04 MED ORDER — RISPERIDONE 3 MG PO TABS: 3.0000 mg | ORAL_TABLET | Freq: Two times a day (BID) | ORAL | 0 refills | Status: DC

## 2017-12-04 MED ORDER — CEPHALEXIN 500 MG PO CAPS: 500.0000 mg | ORAL_CAPSULE | Freq: Two times a day (BID) | ORAL | 0 refills | Status: AC

## 2017-12-04 MED ORDER — MAGNESIUM HYDROXIDE 400 MG/5ML PO SUSP
30.0000 mL | Freq: Once | ORAL | Status: AC
  Administered 2017-12-04: 30 mL via ORAL
  Filled 2017-12-04: qty 30

## 2017-12-04 MED ORDER — POTASSIUM CHLORIDE CRYS ER 10 MEQ PO TBCR
60.0000 meq | EXTENDED_RELEASE_TABLET | Freq: Once | ORAL | Status: AC
  Administered 2017-12-04: 60 meq via ORAL
  Filled 2017-12-04: qty 6

## 2017-12-04 NOTE — Discharge Summary (Signed)
Physician Discharge Summary  Tara Hurst NWG:[id number] DOB: 08/25/1960 DOA: 12/03/2017  PCP: [name], MD  Admit date: 12/03/2017 Discharge date: 12/04/2017  Admitted From: Home Disposition: Home  Recommendations for Outpatient Follow-up:  1. Follow up with PCP in 1 week 2. Follow-up with psychiatry at earliest convenience 3. Comply with medications   Home Health: No Equipment/Devices: None  Discharge Condition: Stable CODE STATUS: Full Diet recommendation: Heart Healthy   Brief/Interim Summary: 57 year old female with history of hypertension, schizoaffective disorder, generalized anxiety disorder, hyponatremia, acute and cephalopathy presented with altered mental status.  Apparently she was out of her medications for past 2 weeks including Ativan.  Her mental status improved after given Ativan in the ED.  She was kept for overnight observation.  She was also started on Rocephin for probable UTI.  Her condition improved.  She feels better and wants to go home.  She will be discharged home with outpatient follow-up with PCP and psychiatry at earliest convenience.  Discharge Diagnoses:  Principal Problem:   Tachycardia Active Problems:   Hypertension   Anxiety   Acute lower UTI  Probable UTI -Currently on Rocephin.  Cultures pending.  Discharge home on Keflex for 3 days.  Altered mental status with tachycardia probably from benzodiazepine withdrawal -Mild improvement after Ativan.  CT head was negative for acute abnormality -Patient needs to comply with her home meds and follow-up with psychiatry/PCP for her medication needs -Discharge patient home -Mental status has improved and probably back to her baseline  Anxiety/schizoaffective disorder -Resume home meds.  Comply with meds  Hypertension-blood pressure stable.  Continue losartan  Hypernatremia -Sodium slightly elevated.  Outpatient follow-up.   Discharge Instructions  Discharge Instructions     Ambulatory referral to Psychiatry   Complete by:  As directed    Patient on multiple medications for psychiatric condition   Call MD for:  difficulty breathing, headache or visual disturbances   Complete by:  As directed    Call MD for:  hives   Complete by:  As directed    Call MD for:  persistant dizziness or light-headedness   Complete by:  As directed    Call MD for:  persistant nausea and vomiting   Complete by:  As directed    Call MD for:  severe uncontrolled pain   Complete by:  As directed    Call MD for:  temperature >100.4   Complete by:  As directed    Diet - low sodium heart healthy   Complete by:  As directed    Increase activity slowly   Complete by:  As directed      Allergies as of 12/04/2017   No Known Allergies     Medication List    STOP taking these medications   diphenhydrAMINE 25 mg capsule Commonly known as:  BENADRYL   folic acid 1 MG tablet Commonly known as:  FOLVITE   multivitamin with minerals Tabs tablet   thiamine 100 MG tablet     TAKE these medications   benztropine 0.5 MG tablet Commonly known as:  COGENTIN Take 1 tablet (0.5 mg total) by mouth 2 (two) times daily.   buPROPion 300 MG 24 hr tablet Commonly known as:  WELLBUTRIN XL Take 300 mg daily by mouth.   cephALEXin 500 MG capsule Commonly known as:  KEFLEX Take 1 capsule (500 mg total) by mouth 2 (two) times daily for 3 days.   LORazepam 1 MG tablet Commonly known as:  ATIVAN Take 1mg  by  mouth in the morning and take 2mg  by mouth at bedtime What changed:    how much to take  how to take this  when to take this   losartan 50 MG tablet Commonly known as:  COZAAR Take 1 tablet by mouth daily.   risperiDONE 3 MG tablet Commonly known as:  RISPERDAL Take 1 tablet (3 mg total) by mouth 2 (two) times daily.      Follow-up [place] Follow up.   Specialty:  Internal Medicine Why:  Or this office can become your main  doctor. Contact information: [facility].   Why:  This office can become your main doctor. Contact information: [place] [id number] [phone]         No Known Allergies  Consultations:  None   Procedures/Studies: Ct Head Wo Contrast  Result Date: 12/03/2017 CLINICAL DATA:  Mental issues per the patient. EXAM: CT HEAD WITHOUT CONTRAST TECHNIQUE: Contiguous axial images were obtained from the base of the skull through the vertex without intravenous contrast. COMPARISON:  October 18, 2017 FINDINGS: Brain: No evidence of acute infarction, hemorrhage, hydrocephalus, extra-axial collection or mass lesion/mass effect. Vascular: No hyperdense vessel or unexpected calcification. Skull: Normal. Negative for fracture or focal lesion. Sinuses/Orbits: No acute finding. Other: None. IMPRESSION: No cause for the patient's symptoms identified. Electronically Signed   By: Dorise Bullion III M.D   On: 12/03/2017 16:12      Subjective: Patient seen and examined at bedside.  She is awake and feels better and wants to go home.  No overnight fever, nausea or vomiting.  Tolerating diet.  Discharge Exam: Vitals:   12/04/17 0000 12/04/17 0119  BP: 131/77 (!) 142/98  Pulse:  94  Resp: 18   Temp:  97.9 F (36.6 C)  SpO2: 100% 100%   Vitals:   12/03/17 2200 12/03/17 2300 12/04/17 0000 12/04/17 0119  BP: (!) 158/105 129/83 131/77 (!) 142/98  Pulse: 93   94  Resp: 15 20 18    Temp:    97.9 F (36.6 C)  TempSrc:    Oral  SpO2: 100% 100% 100% 100%  Weight:    68.7 kg (151 lb 7.3 oz)  Height:    5\' 4"  (1.626 m)    General: Pt is alert, awake, not in acute distress.  Looks older than stated age Cardiovascular: Rate controlled, S1/S2 + Respiratory: Bilateral decreased breath sounds at bases  abdominal: Soft, NT, ND, bowel sounds + Extremities:  no edema, no cyanosis    The results of significant diagnostics from this hospitalization (including imaging, microbiology, ancillary and laboratory) are listed below for reference.     Microbiology: No results found for this or any previous visit (from the past 240 hour(s)).   Labs: BNP (last 3 results) No results for input(s): BNP in the last 8760 hours. Basic Metabolic Panel: Recent Labs  Lab 12/03/17 1526 12/04/17 0555  NA 147* 148*  K 3.3* 3.4*  CL 108 113*  CO2 23 25  GLUCOSE 84 85  BUN 13 12  CREATININE 1.29* 1.15*  CALCIUM 10.9* 9.8   Liver Function Tests: Recent Labs  Lab 12/03/17 1526  AST 15  ALT 15  ALKPHOS 100  BILITOT 1.2  PROT 8.2*  ALBUMIN 5.0   No results for input(s): LIPASE, AMYLASE in the last  168 hours. Recent Labs  Lab 12/03/17 1526  AMMONIA 19   CBC: Recent Labs  Lab 12/03/17 1526  WBC 11.9*  NEUTROABS 9.8*  HGB 11.6*  HCT 35.2*  MCV 81.3  PLT 257   Cardiac Enzymes: Recent Labs  Lab 12/03/17 2122  CKTOTAL 47   BNP: Invalid input(s): POCBNP CBG: Recent Labs  Lab 12/03/17 1409  GLUCAP 94   D-Dimer No results for input(s): DDIMER in the last 72 hours. Hgb A1c No results for input(s): HGBA1C in the last 72 hours. Lipid Profile No results for input(s): CHOL, HDL, LDLCALC, TRIG, CHOLHDL, LDLDIRECT in the last 72 hours. Thyroid function studies No results for input(s): TSH, T4TOTAL, T3FREE, THYROIDAB in the last 72 hours.  Invalid input(s): FREET3 Anemia work up No results for input(s): VITAMINB12, FOLATE, FERRITIN, TIBC, IRON, RETICCTPCT in the last 72 hours. Urinalysis    Component Value Date/Time   COLORURINE STRAW (A) 12/03/2017 1540   APPEARANCEUR CLEAR 12/03/2017 1540   LABSPEC 1.003 (L) 12/03/2017 1540   PHURINE 6.0 12/03/2017 1540   GLUCOSEU NEGATIVE 12/03/2017 1540   HGBUR SMALL (A) 12/03/2017 1540   BILIRUBINUR NEGATIVE 12/03/2017 1540   KETONESUR 5 (A) 12/03/2017 1540   PROTEINUR NEGATIVE 12/03/2017  1540   UROBILINOGEN 0.2 09/25/2013 1322   NITRITE NEGATIVE 12/03/2017 1540   LEUKOCYTESUR LARGE (A) 12/03/2017 1540   Sepsis Labs Invalid input(s): PROCALCITONIN,  WBC,  LACTICIDVEN Microbiology No results found for this or any previous visit (from the past 240 hour(s)).   Time coordinating discharge: 35 minutes  SIGNED:   Aline August, MD  [facility] 12/04/2017, 10:48 AM Pager: [phone]  If 7PM-7AM, please contact night-coverage [email or web address] Password [email or web address]

## 2017-12-04 NOTE — Progress Notes (Signed)
LCSW received a call at dc about patient needing assistance with transportation for meds.   LCSW brought [facility] packet and discussed options with patient. Patient reports she is already signed up with [facility], but doesn't like to wait for them to pick her up.   Patient states she will ask her friend to take her to get her meds.   Carolin Coy Leon [place]

## 2017-12-04 NOTE — ED Notes (Signed)
ED TO INPATIENT HANDOFF REPORT  Name/Age/Gender Tara Hurst 57 y.o. female  Code Status    Code Status Orders  (From admission, onward)        Start     Ordered   12/03/17 2212  Full code  Continuous     12/03/17 2212    Code Status History    Date Active Date Inactive Code Status Order ID Comments User Context   10/18/2017 2037 10/19/2017 1645 Full Code [id number]  Layla Maw ED   05/03/2017 0448 05/10/2017 2202 Full Code [id number]  Rise Patience, MD ED   05/03/2017 0009 05/03/2017 0448 Full Code [id number]  Virgel Manifold, MD ED   09/25/2013 1735 09/28/2013 1744 Full Code [id number]  Velvet Bathe, MD Inpatient      Home/SNF/Other Home  Chief Complaint Psych Med Refill  Level of Care/Admitting Diagnosis ED Disposition    ED Disposition Condition Comment   [place]: [facility] [[id number]]  Level of Care: Telemetry [5]  Admit to tele based on following criteria: Complex arrhythmia (Bradycardia/Tachycardia)  Diagnosis: Tachycardia [709628]  Admitting Physician: Doreatha Massed  Attending Physician: Etta Quill [[id number]]  PT Class (Do Not Modify): Observation [104]  PT Acc Code (Do Not Modify): Observation [10022]       Medical History Past Medical History:  Diagnosis Date  . Hypertension   . Schizoaffective disorder ([place])     Allergies No Known Allergies  IV Location/Drains/Wounds Patient Lines/Drains/Airways Status   Active Line/Drains/Airways    Name:   Placement date:   Placement time:   Site:   Days:   Peripheral IV 12/03/17 Left Hand   12/03/17    1427    Hand   1          Labs/Imaging Results for orders placed or performed during the hospital encounter of 12/03/17 (from the past 48 hour(s))  CBG monitoring, ED     Status: None   Collection Time: 12/03/17  2:09 PM  Result Value Ref Range   Glucose-Capillary 94 65 - 99 mg/dL  CBC with Differential     Status: Abnormal   Collection Time: 12/03/17   3:26 PM  Result Value Ref Range   WBC 11.9 (H) 4.0 - 10.5 K/uL   RBC 4.33 3.87 - 5.11 MIL/uL   Hemoglobin 11.6 (L) 12.0 - 15.0 g/dL   HCT 35.2 (L) 36.0 - 46.0 %   MCV 81.3 78.0 - 100.0 fL   MCH 26.8 26.0 - 34.0 pg   MCHC 33.0 30.0 - 36.0 g/dL   RDW 13.2 11.5 - 15.5 %   Platelets 257 150 - 400 K/uL   Neutrophils Relative % 82 %   Neutro Abs 9.8 (H) 1.7 - 7.7 K/uL   Lymphocytes Relative 10 %   Lymphs Abs 1.2 0.7 - 4.0 K/uL   Monocytes Relative 8 %   Monocytes Absolute 0.9 0.1 - 1.0 K/uL   Eosinophils Relative 0 %   Eosinophils Absolute 0.0 0.0 - 0.7 K/uL   Basophils Relative 0 %   Basophils Absolute 0.0 0.0 - 0.1 K/uL    Comment: Performed at [facility], [place] [address], [place], [place] [address]  Comprehensive metabolic panel     Status: Abnormal   Collection Time: 12/03/17  3:26 PM  Result Value Ref Range   Sodium 147 (H) 135 - 145 mmol/L   Potassium 3.3 (L) 3.5 - 5.1 mmol/L   Chloride 108 101 -  111 mmol/L   CO2 23 22 - 32 mmol/L   Glucose, Bld 84 65 - 99 mg/dL   BUN 13 6 - 20 mg/dL   Creatinine, Ser 1.29 (H) 0.44 - 1.00 mg/dL   Calcium 10.9 (H) 8.9 - 10.3 mg/dL   Total Protein 8.2 (H) 6.5 - 8.1 g/dL   Albumin 5.0 3.5 - 5.0 g/dL   AST 15 15 - 41 U/L   ALT 15 14 - 54 U/L   Alkaline Phosphatase 100 38 - 126 U/L   Total Bilirubin 1.2 0.3 - 1.2 mg/dL   GFR calc non Af Amer 45 (L) >60 mL/min   GFR calc Af Amer 53 (L) >60 mL/min    Comment: (NOTE) The eGFR has been calculated using the CKD EPI equation. This calculation has not been validated in all clinical situations. eGFR's persistently <60 mL/min signify possible Chronic Kidney Disease.    Anion gap 16 (H) 5 - 15    Comment: Performed at [facility], [place] [address] [place], [place] [address]  Ethanol     Status: None   Collection Time: 12/03/17  3:26 PM  Result Value Ref Range   Alcohol, Ethyl (B) <10 <10 mg/dL    Comment: (NOTE) Lowest detectable limit for serum alcohol  is 10 mg/dL. For medical purposes only. Performed at [facility], [place] [address] [place], [place] [address]   Salicylate level     Status: None   Collection Time: 12/03/17  3:26 PM  Result Value Ref Range   Salicylate Lvl <0.1 2.8 - 30.0 mg/dL    Comment: Performed at [facility], [place], [place] [address] [place], [place] [address]  Acetaminophen level     Status: Abnormal   Collection Time: 12/03/17  3:26 PM  Result Value Ref Range   Acetaminophen (Tylenol), Serum <10 (L) 10 - 30 ug/mL    Comment: (NOTE) Therapeutic concentrations vary significantly. A range of 10-30 ug/mL  may be an effective concentration for many patients. However, some  are best treated at concentrations outside of this range. Acetaminophen concentrations >150 ug/mL at 4 hours after ingestion  and >50 ug/mL at 12 hours after ingestion are often associated with  toxic reactions. Performed at [facility], [place] [address], [place], [place] [address]   Ammonia     Status: None   Collection Time: 12/03/17  3:26 PM  Result Value Ref Range   Ammonia 19 9 - 35 umol/L    Comment: Performed at [facility], [place] [address], [place], [place] [address]  Lithium level     Status: Abnormal   Collection Time: 12/03/17  3:26 PM  Result Value Ref Range   Lithium Lvl <0.06 (L) 0.60 - 1.20 mmol/L    Comment: REPEATED TO VERIFY Performed at [place] [address] [place], [place] [address]   I-stat troponin, ED     Status: None   Collection Time: 12/03/17  3:31 PM  Result Value Ref Range   Troponin i, poc 0.01 0.00 - 0.08 ng/mL   Comment 3            Comment: Due to the release kinetics of cTnI, a negative result within the first hours of the onset of symptoms does not rule out myocardial infarction with certainty. If myocardial infarction is still suspected, repeat the test at appropriate intervals.   Rapid urine drug  screen (hospital performed)     Status: None  Collection Time: 12/03/17  3:40 PM  Result Value Ref Range   Opiates NONE DETECTED NONE DETECTED   Cocaine NONE DETECTED NONE DETECTED   Benzodiazepines NONE DETECTED NONE DETECTED   Amphetamines NONE DETECTED NONE DETECTED   Tetrahydrocannabinol NONE DETECTED NONE DETECTED   Barbiturates NONE DETECTED NONE DETECTED    Comment: (NOTE) DRUG SCREEN FOR MEDICAL PURPOSES ONLY.  IF CONFIRMATION IS NEEDED FOR ANY PURPOSE, NOTIFY LAB WITHIN 5 DAYS. LOWEST DETECTABLE LIMITS FOR URINE DRUG SCREEN Drug Class                     Cutoff (ng/mL) Amphetamine and metabolites    1000 Barbiturate and metabolites    200 Benzodiazepine                 161 Tricyclics and metabolites     300 Opiates and metabolites        300 Cocaine and metabolites        300 THC                            50 Performed at [facility], [place] [address] [place], [place] [address]   Urinalysis, Routine w reflex microscopic     Status: Abnormal   Collection Time: 12/03/17  3:40 PM  Result Value Ref Range   Color, Urine STRAW (A) YELLOW   APPearance CLEAR CLEAR   Specific Gravity, Urine 1.003 (L) 1.005 - 1.030   pH 6.0 5.0 - 8.0   Glucose, UA NEGATIVE NEGATIVE mg/dL   Hgb urine dipstick SMALL (A) NEGATIVE   Bilirubin Urine NEGATIVE NEGATIVE   Ketones, ur 5 (A) NEGATIVE mg/dL   Protein, ur NEGATIVE NEGATIVE mg/dL   Nitrite NEGATIVE NEGATIVE   Leukocytes, UA LARGE (A) NEGATIVE   RBC / HPF 0-5 0 - 5 RBC/hpf   WBC, UA 6-10 0 - 5 WBC/hpf   Bacteria, UA RARE (A) NONE SEEN   Squamous Epithelial / LPF 0-5 0 - 5    Comment: Performed at [facility], [place] [address], [place],  [address]  I-Stat Troponin, ED (not at [facility])     Status: None   Collection Time: 12/03/17  6:25 PM  Result Value Ref Range   Troponin i, poc 0.02 0.00 - 0.08 ng/mL   Comment 3            Comment: Due to the release kinetics of cTnI, a negative result  within the first hours of the onset of symptoms does not rule out myocardial infarction with certainty. If myocardial infarction is still suspected, repeat the test at appropriate intervals.   CK     Status: None   Collection Time: 12/03/17  9:22 PM  Result Value Ref Range   Total CK 47 38 - 234 U/L    Comment: Performed at [facility], [place] [address], [place], [place] [address]   Ct Head Wo Contrast  Result Date: 12/03/2017 CLINICAL DATA:  Mental issues per the patient. EXAM: CT HEAD WITHOUT CONTRAST TECHNIQUE: Contiguous axial images were obtained from the base of the skull through the vertex without intravenous contrast. COMPARISON:  October 18, 2017 FINDINGS: Brain: No evidence of acute infarction, hemorrhage, hydrocephalus, extra-axial collection or mass lesion/mass effect. Vascular: No hyperdense vessel or unexpected calcification. Skull: Normal. Negative for fracture or focal lesion. Sinuses/Orbits: No acute finding. Other: None. IMPRESSION: No cause for the patient's symptoms identified. Electronically  Signed   By: Dorise Bullion III M.D   On: 12/03/2017 16:12    Pending Labs Unresulted Labs (From admission, onward)   Start     Ordered   12/04/17 1610  Basic metabolic panel  Tomorrow morning,   R     12/03/17 2212   12/03/17 1948  Urine culture  STAT,   STAT     12/03/17 1947      Vitals/Pain Today's Vitals   12/03/17 2130 12/03/17 2200 12/03/17 2300 12/04/17 0000  BP: 138/86 (!) 158/105 129/83 131/77  Pulse:  93    Resp: '13 15 20 18  '$ Temp:      TempSrc:      SpO2: 100% 100% 100% 100%  Weight:      Height:      PainSc:        Isolation Precautions No active isolations  Medications Medications  LORazepam (ATIVAN) tablet 2 mg (2 mg Oral Given 9/60/45 4098)  folic acid (FOLVITE) tablet 1 mg (has no administration in time range)  risperiDONE (RISPERDAL) tablet 3 mg (has no administration in time range)  thiamine (VITAMIN B-1) tablet 100 mg (has  no administration in time range)  buPROPion (WELLBUTRIN XL) 24 hr tablet 300 mg (has no administration in time range)  benztropine (COGENTIN) tablet 0.5 mg (has no administration in time range)  diphenhydrAMINE (BENADRYL) capsule 25 mg (has no administration in time range)  losartan (COZAAR) tablet 50 mg (has no administration in time range)  acetaminophen (TYLENOL) tablet 650 mg (has no administration in time range)    Or  acetaminophen (TYLENOL) suppository 650 mg (has no administration in time range)  ondansetron (ZOFRAN) tablet 4 mg (has no administration in time range)    Or  ondansetron (ZOFRAN) injection 4 mg (has no administration in time range)  enoxaparin (LOVENOX) injection 40 mg (has no administration in time range)  cefTRIAXone (ROCEPHIN) 1 g in sodium chloride 0.9 % 100 mL IVPB (1 g Intravenous New Bag/Given 12/03/17 2318)  LORazepam (ATIVAN) tablet 1 mg (has no administration in time range)  sodium chloride 0.9 % bolus 1,000 mL (0 mLs Intravenous Stopped 12/03/17 1608)  LORazepam (ATIVAN) injection 1 mg (1 mg Intravenous Given 12/03/17 1538)  sodium chloride 0.9 % bolus 1,000 mL (0 mLs Intravenous Stopped 12/03/17 2043)  LORazepam (ATIVAN) injection 1 mg (1 mg Intravenous Given 12/03/17 2011)    Mobility walks

## 2017-12-05 LAB — URINE CULTURE

## 2017-12-10 ENCOUNTER — Emergency Department (HOSPITAL_COMMUNITY)
Admission: EM | Admit: 2017-12-10 | Discharge: 2017-12-11 | Disposition: A | Discharge status: Home / Self Care | Payer: Medicaid Other | Attending: Emergency Medicine | Admitting: Emergency Medicine

## 2017-12-10 ENCOUNTER — Encounter (HOSPITAL_COMMUNITY): Payer: Self-pay | Admitting: *Deleted

## 2017-12-10 DIAGNOSIS — F13930 Sedative, hypnotic or anxiolytic use, unspecified with withdrawal, uncomplicated: Secondary | ICD-10-CM

## 2017-12-10 DIAGNOSIS — I10 Essential (primary) hypertension: Secondary | ICD-10-CM | POA: Insufficient documentation

## 2017-12-10 DIAGNOSIS — Z760 Encounter for issue of repeat prescription: Secondary | ICD-10-CM | POA: Insufficient documentation

## 2017-12-10 DIAGNOSIS — Z79899 Other long term (current) drug therapy: Secondary | ICD-10-CM | POA: Diagnosis not present

## 2017-12-10 DIAGNOSIS — F13230 Sedative, hypnotic or anxiolytic dependence with withdrawal, uncomplicated: Secondary | ICD-10-CM | POA: Insufficient documentation

## 2017-12-10 MED ORDER — SODIUM CHLORIDE 0.9 % IV BOLUS
500.0000 mL | Freq: Once | INTRAVENOUS | Status: AC
  Administered 2017-12-10: 500 mL via INTRAVENOUS

## 2017-12-10 MED ORDER — LORAZEPAM 2 MG/ML IJ SOLN
1.0000 mg | Freq: Once | INTRAMUSCULAR | Status: AC
  Administered 2017-12-10: 1 mg via INTRAVENOUS
  Filled 2017-12-10: qty 1

## 2017-12-10 MED ORDER — LORAZEPAM 1 MG PO TABS: 1.0000 mg | ORAL_TABLET | ORAL | 0 refills | Status: DC

## 2017-12-10 MED ORDER — LORAZEPAM 1 MG PO TABS
1.0000 mg | ORAL_TABLET | Freq: Once | ORAL | Status: AC
  Administered 2017-12-10: 1 mg via ORAL
  Filled 2017-12-10: qty 1

## 2017-12-10 MED ORDER — RISPERIDONE 1 MG PO TABS
3.0000 mg | ORAL_TABLET | Freq: Once | ORAL | Status: AC
  Administered 2017-12-11: 3 mg via ORAL
  Filled 2017-12-10: qty 1

## 2017-12-10 NOTE — Discharge Instructions (Addendum)
Get your medicines filled tomorrow and discuss with your primary care doctor as needed.

## 2017-12-10 NOTE — ED Provider Notes (Signed)
[place] DEPT Provider Note   CSN: [id number] Arrival date & time: 12/10/17  1639     History   Chief Complaint Chief Complaint  Patient presents with  . Medication Refill  . Anxiety    HPI Tara Hurst is a 57 y.o. female.  HPI Patient presents in withdrawal from her Ativan.  Last took yesterday morning.  States she is been having a difficult time getting a refill because she cannot call her doctor like she used to.  Seems Dr. Katherine Roan.  Had been on 3 mg of Ativan throughout the day.  During recent admission had given a few day supply and states she used the last 1 yesterday morning.  Now tremulous and anxious.  No shortness of breath.  States she was going to go to the pharmacy today and thinks she has a refill of it there. Past Medical History:  Diagnosis Date  . Hypertension   . Schizoaffective disorder [facility])     Patient Active Problem List   Diagnosis Date Noted  . Tachycardia 12/03/2017  . Acute lower UTI 12/03/2017  . GAD (generalized anxiety disorder) 10/19/2017  . Anxiety   . UTI due to Klebsiella species   . Hypernatremia   . Acute encephalopathy 05/03/2017  . ARF (acute renal failure) ([place]) 05/03/2017  . HCAP (healthcare-associated pneumonia) 09/25/2013  . Hypertension 09/25/2013  . Abnormal lithium level in blood 09/25/2013    History reviewed. No pertinent surgical history.   OB History   None      Home Medications    Prior to Admission medications   Medication Sig Start Date End Date Taking? Authorizing Provider  buPROPion (WELLBUTRIN XL) 300 MG 24 hr tablet Take 300 mg daily by mouth.   Yes [provider]  LORazepam (ATIVAN) 1 MG tablet Take 1mg  by mouth in the morning and take 2mg  by mouth at bedtime 12/04/17  Yes Aline August, MD  losartan (COZAAR) 50 MG tablet Take 1 tablet by mouth daily.   Yes [provider]  risperiDONE (RISPERDAL) 3 MG tablet Take 1 tablet (3 mg total) by mouth 2  (two) times daily. Patient taking differently: Take 6 mg by mouth at bedtime.  12/04/17  Yes Aline August, MD  benztropine (COGENTIN) 0.5 MG tablet Take 1 tablet (0.5 mg total) by mouth 2 (two) times daily. Patient not taking: Reported on 10/18/2017 09/28/13   Kinnie Feil, MD  LORazepam (ATIVAN) 1 MG tablet Take 1 tablet (1 mg total) by mouth every morning. 12/10/17   Davonna Belling, MD    Family History Family History  Problem Relation Age of Onset  . Heart disease Mother   . Diabetic kidney disease Mother     Social History Social History   Tobacco Use  . Smoking status: Never Smoker  . Smokeless tobacco: Never Used  Substance Use Topics  . Alcohol use: No  . Drug use: No     Allergies   Patient has no known allergies.   Review of Systems Review of Systems  Constitutional: Negative for appetite change.  HENT: Negative for congestion.   Respiratory: Negative for shortness of breath.   Cardiovascular: Negative for chest pain.  Gastrointestinal: Negative for abdominal pain.  Genitourinary: Negative for dysuria and hematuria.  Musculoskeletal: Negative for back pain.  Skin: Negative for rash.  Neurological: Negative for weakness.  Psychiatric/Behavioral: Negative for suicidal ideas. The patient is nervous/anxious.      Physical Exam Updated Vital Signs BP (!) 152/97  Pulse (!) 101   Temp 97.8 F (36.6 C) (Oral)   Resp 16   Ht 5\' 4"  (1.626 m)   Wt 69.9 kg (154 lb)   SpO2 100%   BMI 26.43 kg/m   Physical Exam  Constitutional: She appears well-developed.  HENT:  Head: Normocephalic.  Eyes: Pupils are equal, round, and reactive to light.  Neck: Neck supple.  Cardiovascular: Normal rate.  Tachycardia  Pulmonary/Chest: Effort normal.  Abdominal: There is no tenderness.  Musculoskeletal: She exhibits no tenderness.  Neurological: She is alert.  Skin: Skin is warm. Capillary refill takes less than 2 seconds.     ED Treatments / Results   Labs (all labs ordered are listed, but only abnormal results are displayed) Labs Reviewed - No data to display  EKG EKG Interpretation  Date/Time:  Monday December 10 2017 19:29:08 EDT Ventricular Rate:  129 PR Interval:  158 QRS Duration: 66 QT Interval:  290 QTC Calculation: 424 R Axis:   -11 Text Interpretation:  Sinus tachycardia Biatrial enlargement Anterior infarct , age undetermined Abnormal ECG Confirmed by Davonna Belling [phone]) on 12/10/2017 7:54:16 PM   Radiology No results found.  Procedures Procedures (including critical care time)  Medications Ordered in ED Medications  sodium chloride 0.9 % bolus 500 mL (0 mLs Intravenous Stopped 12/10/17 2143)  LORazepam (ATIVAN) injection 1 mg (1 mg Intravenous Given 12/10/17 2043)  LORazepam (ATIVAN) tablet 1 mg (1 mg Oral Given 12/10/17 2319)     Initial Impression / Assessment and Plan / ED Course  I have reviewed the triage vital signs and the nursing notes.  Pertinent labs & imaging results that were available during my care of the patient were reviewed by me and considered in my medical decision making (see chart for details).     Patient benzodiazepine withdrawal.  History of same.  Has run out of the supply she was given from recent admission.  States she has pills at the pharmacy from her primary care doctor.  States she has been unable to get into see him.  Patient given 1 mg of IV Ativan here has improvement in heart rate and vitals.  Given another oral and a prescription for 1 pill for home.  Reviewed drug database.  Will need to pick up the medicines from her pharmacy or talk to her primary care doctor.  Final Clinical Impressions(s) / ED Diagnoses   Final diagnoses:  Benzodiazepine withdrawal without complication [facility])    ED Discharge Orders        Ordered    LORazepam (ATIVAN) 1 MG tablet  BH-each morning     12/10/17 2246       Davonna Belling, MD 12/10/17 2333

## 2017-12-10 NOTE — ED Notes (Signed)
Bed: [facility] Expected date:  Expected time:  Means of arrival:  Comments: 

## 2017-12-10 NOTE — ED Notes (Signed)
Pt is anxious, tachycardic in triage. Pt states she last took lorazepam yesterday morning.

## 2017-12-10 NOTE — ED Triage Notes (Signed)
Per EMS, out of meds for 2 days-BP and HR elevated patient is upset because she is out of meds

## 2017-12-11 ENCOUNTER — Emergency Department (HOSPITAL_COMMUNITY)
Admission: EM | Admit: 2017-12-11 | Discharge: 2017-12-12 | Disposition: A | Discharge status: Home / Self Care | Payer: Medicaid Other | Source: Home / Self Care | Attending: Emergency Medicine | Admitting: Emergency Medicine

## 2017-12-11 ENCOUNTER — Other Ambulatory Visit: Payer: Self-pay

## 2017-12-11 ENCOUNTER — Encounter (HOSPITAL_COMMUNITY): Payer: Self-pay

## 2017-12-11 DIAGNOSIS — F419 Anxiety disorder, unspecified: Secondary | ICD-10-CM

## 2017-12-11 NOTE — ED Notes (Signed)
[facility] here to transport patient back to facility 

## 2017-12-11 NOTE — ED Triage Notes (Signed)
Pt BIB [facility] for eval of anxiety. Pt reports she was seen yesterday for same complaint, at that time given a script for anxiety meds but her social worker has not filled them and delivered them to her yet. She is anxious about still not having medication. HR elevated, pt denies any acute physical complaints, denies any chest pain/SOB, N/V

## 2017-12-12 MED ORDER — RISPERIDONE 3 MG PO TABS
3.0000 mg | ORAL_TABLET | Freq: Once | ORAL | Status: AC
  Administered 2017-12-12: 3 mg via ORAL
  Filled 2017-12-12: qty 1

## 2017-12-12 MED ORDER — LORAZEPAM 1 MG PO TABS
2.0000 mg | ORAL_TABLET | Freq: Once | ORAL | Status: AC
  Administered 2017-12-12: 2 mg via ORAL
  Filled 2017-12-12: qty 2

## 2017-12-12 NOTE — ED Notes (Signed)
Pt requesting to see provider, PA Up[facility] informed of request

## 2017-12-12 NOTE — ED Provider Notes (Signed)
[place] EMERGENCY DEPARTMENT Provider Note   CSN: [id number] Arrival date & time: 12/11/17  2334     History   Chief Complaint Chief Complaint  Patient presents with  . Anxiety    HPI Tara Hurst is a 57 y.o. female.  Patient here for medication refill. She has regularly taken Risperdal and Ativan for anxiety and has been unable to obtain her regular prescriptions for reasons that unclear. She states her social worker has not brought already written Rxs to her yet. Per note review of 12/04/17, the patient has been signed up with [facility] and has transportation available but does not like to use it. No other social work notes are available for review. She was seen on 12/10/17 in the emergency department for same problem with being out of her medications. She states it is anxiety that she is having a problem with. She denies hallucinations, SI/HI or feeling mentally unstable. No nausea, vomiting. She has tremors but states this is chronic for her. No SOB, CP, abdominal pain.   The history is provided by the patient. No language interpreter was used.  Anxiety     Past Medical History:  Diagnosis Date  . Hypertension   . Schizoaffective disorder [facility])     Patient Active Problem List   Diagnosis Date Noted  . Tachycardia 12/03/2017  . Acute lower UTI 12/03/2017  . GAD (generalized anxiety disorder) 10/19/2017  . Anxiety   . UTI due to Klebsiella species   . Hypernatremia   . Acute encephalopathy 05/03/2017  . ARF (acute renal failure) ([place]) 05/03/2017  . HCAP (healthcare-associated pneumonia) 09/25/2013  . Hypertension 09/25/2013  . Abnormal lithium level in blood 09/25/2013    History reviewed. No pertinent surgical history.   OB History   None      Home Medications    Prior to Admission medications   Medication Sig Start Date End Date Taking? Authorizing Provider  buPROPion (WELLBUTRIN XL) 300 MG 24 hr tablet Take 300 mg daily by mouth.   Yes  [provider]  losartan (COZAAR) 50 MG tablet Take 1 tablet by mouth daily.   Yes [provider]  risperiDONE (RISPERDAL) 3 MG tablet Take 1 tablet (3 mg total) by mouth 2 (two) times daily. Patient taking differently: Take 6 mg by mouth at bedtime.  12/04/17  Yes Aline August, MD  benztropine (COGENTIN) 0.5 MG tablet Take 1 tablet (0.5 mg total) by mouth 2 (two) times daily. Patient not taking: Reported on 12/11/2017 09/28/13   Kinnie Feil, MD  LORazepam (ATIVAN) 1 MG tablet Take 1mg  by mouth in the morning and take 2mg  by mouth at bedtime Patient not taking: Reported on 12/11/2017 12/04/17   Aline August, MD  LORazepam (ATIVAN) 1 MG tablet Take 1 tablet (1 mg total) by mouth every morning. 12/10/17   Davonna Belling, MD    Family History Family History  Problem Relation Age of Onset  . Heart disease Mother   . Diabetic kidney disease Mother     Social History Social History   Tobacco Use  . Smoking status: Never Smoker  . Smokeless tobacco: Never Used  Substance Use Topics  . Alcohol use: No  . Drug use: No     Allergies   Patient has no known allergies.   Review of Systems Review of Systems  Constitutional: Negative for chills and fever.  HENT: Negative.   Respiratory: Negative.   Cardiovascular: Negative.   Gastrointestinal: Negative.  Musculoskeletal: Negative.   Skin: Negative.   Neurological: Positive for tremors (Chronic).  Psychiatric/Behavioral: Negative for hallucinations and suicidal ideas. The patient is nervous/anxious.      Physical Exam Updated Vital Signs BP (!) 160/96 (BP Location: Right Arm)   Pulse (!) 120   Temp 98.8 F (37.1 C) (Oral)   Resp (!) 24   Ht 5\' 4"  (1.626 m)   Wt 70.3 kg (155 lb)   SpO2 99%   BMI 26.61 kg/m   Physical Exam  Constitutional: She is oriented to person, place, and time. She appears well-developed and well-nourished.  HENT:  Head: Normocephalic.  Neck: Normal range of motion. Neck  supple.  Cardiovascular: Regular rhythm. Tachycardia present.  Pulmonary/Chest: Effort normal and breath sounds normal. No respiratory distress.  Abdominal: Soft. Bowel sounds are normal. There is no tenderness. There is no rebound and no guarding.  Musculoskeletal: Normal range of motion.  Neurological: She is alert and oriented to person, place, and time.  Resting tremor in UE's.   Skin: Skin is warm and dry. No rash noted.  Psychiatric: She has a normal mood and affect.     ED Treatments / Results  Labs (all labs ordered are listed, but only abnormal results are displayed) Labs Reviewed - No data to display  EKG None  Radiology No results found.  Procedures Procedures (including critical care time)  Medications Ordered in ED Medications  LORazepam (ATIVAN) tablet 2 mg (has no administration in time range)  risperiDONE (RISPERDAL) tablet 3 mg (has no administration in time range)     Initial Impression / Assessment and Plan / ED Course  I have reviewed the triage vital signs and the nursing notes.  Pertinent labs & imaging results that were available during my care of the patient were reviewed by me and considered in my medical decision making (see chart for details).     Patient here again for medication refill of her Ativan and Risperdal. She again reports her medications are at the pharmacy but she is having trouble retrieving them. Strongly encouraged calling the PCP tomorrow for assistance in working that out. Dose of each medication provided in ED. The patient states she feels comfortable with going home.   Final Clinical Impressions(s) / ED Diagnoses   Final diagnoses:  None   1. Anxiety  ED Discharge Orders    None       Charlann Lange, Hershal Coria 12/12/17 Rodman Key, MD 12/12/17 [phone]

## 2017-12-12 NOTE — Discharge Instructions (Addendum)
IT IS IMPORTANT THAT YOU FOLLOW UP WITH YOUR DOCTOR FOR ASSISTANCE IN OBTAINING YOUR REGULAR MEDICATION. THESE MEDICATIONS CANNOT BE ROUTINELY PRESCRIBED FROM THE EMERGENCY DEPARTMENT.

## 2018-05-20 ENCOUNTER — Emergency Department (HOSPITAL_COMMUNITY)
Admission: EM | Admit: 2018-05-20 | Discharge: 2018-05-20 | Disposition: A | Discharge status: Home / Self Care | Payer: Medicaid Other | Attending: Emergency Medicine | Admitting: Emergency Medicine

## 2018-05-20 ENCOUNTER — Other Ambulatory Visit: Payer: Self-pay

## 2018-05-20 ENCOUNTER — Encounter (HOSPITAL_COMMUNITY): Payer: Self-pay | Admitting: Emergency Medicine

## 2018-05-20 DIAGNOSIS — E876 Hypokalemia: Secondary | ICD-10-CM | POA: Insufficient documentation

## 2018-05-20 DIAGNOSIS — I10 Essential (primary) hypertension: Secondary | ICD-10-CM | POA: Insufficient documentation

## 2018-05-20 DIAGNOSIS — Z79899 Other long term (current) drug therapy: Secondary | ICD-10-CM | POA: Insufficient documentation

## 2018-05-20 DIAGNOSIS — R4689 Other symptoms and signs involving appearance and behavior: Secondary | ICD-10-CM | POA: Diagnosis present

## 2018-05-20 DIAGNOSIS — F419 Anxiety disorder, unspecified: Secondary | ICD-10-CM | POA: Diagnosis not present

## 2018-05-20 LAB — CBC WITH DIFFERENTIAL/PLATELET
Abs Immature Granulocytes: 0.02 10*3/uL (ref 0.00–0.07)
BASOS PCT: 1 %
Basophils Absolute: 0 10*3/uL (ref 0.0–0.1)
Eosinophils Absolute: 0.2 10*3/uL (ref 0.0–0.5)
Eosinophils Relative: 2 %
HEMATOCRIT: 35.2 % — AB (ref 36.0–46.0)
Hemoglobin: 11.3 g/dL — ABNORMAL LOW (ref 12.0–15.0)
IMMATURE GRANULOCYTES: 0 %
LYMPHS ABS: 1.5 10*3/uL (ref 0.7–4.0)
Lymphocytes Relative: 21 %
MCH: 27 pg (ref 26.0–34.0)
MCHC: 32.1 g/dL (ref 30.0–36.0)
MCV: 84 fL (ref 80.0–100.0)
MONO ABS: 0.6 10*3/uL (ref 0.1–1.0)
MONOS PCT: 8 %
NEUTROS ABS: 4.9 10*3/uL (ref 1.7–7.7)
NEUTROS PCT: 68 %
PLATELETS: 226 10*3/uL (ref 150–400)
RBC: 4.19 MIL/uL (ref 3.87–5.11)
RDW: 13.6 % (ref 11.5–15.5)
WBC MORPHOLOGY: REACTIVE
WBC: 7.2 10*3/uL (ref 4.0–10.5)
nRBC: 0 % (ref 0.0–0.2)

## 2018-05-20 LAB — BASIC METABOLIC PANEL
Anion gap: 9 (ref 5–15)
BUN: 5 mg/dL — ABNORMAL LOW (ref 6–20)
CALCIUM: 10.4 mg/dL — AB (ref 8.9–10.3)
CO2: 31 mmol/L (ref 22–32)
CREATININE: 1.33 mg/dL — AB (ref 0.44–1.00)
Chloride: 105 mmol/L (ref 98–111)
GFR calc Af Amer: 50 mL/min — ABNORMAL LOW (ref >60)
GFR, EST NON AFRICAN AMERICAN: 43 mL/min — AB (ref >60)
Glucose, Bld: 104 mg/dL — ABNORMAL HIGH (ref 70–99)
Potassium: 2.6 mmol/L — CL (ref 3.5–5.1)
SODIUM: 145 mmol/L (ref 135–145)

## 2018-05-20 LAB — SALICYLATE LEVEL: Salicylate Lvl: <7 mg/dL (ref 2.8–30.0)

## 2018-05-20 LAB — ETHANOL: Alcohol, Ethyl (B): <10 mg/dL (ref <10)

## 2018-05-20 LAB — ACETAMINOPHEN LEVEL

## 2018-05-20 MED ORDER — POTASSIUM CHLORIDE CRYS ER 20 MEQ PO TBCR: 20.0000 meq | EXTENDED_RELEASE_TABLET | Freq: Two times a day (BID) | ORAL | 0 refills | Status: DC

## 2018-05-20 MED ORDER — POTASSIUM CHLORIDE CRYS ER 20 MEQ PO TBCR
40.0000 meq | EXTENDED_RELEASE_TABLET | Freq: Once | ORAL | Status: AC
  Administered 2018-05-20: 40 meq via ORAL
  Filled 2018-05-20: qty 2

## 2018-05-20 NOTE — ED Triage Notes (Signed)
Pt arriving from home with complaint of anxiety/depression. Pt reports taking medications for such as prescribed. Pt states she came in because residents at the facility she lives in were complaining about how she smells. Pt hypertensive but claims she has been taking her medications as prescribed for such.

## 2018-05-20 NOTE — Discharge Instructions (Signed)
Have your potassium level rechecked by your doctor within the next 7 days. Follow up with your mental health doctors as scheduled.

## 2018-05-20 NOTE — ED Provider Notes (Signed)
[facility] DEPT Provider Note   CSN: [id number] Arrival date & time: 05/20/18  0308     History   Chief Complaint Chief Complaint  Patient presents with  . Anxiety  . Depression    HPI Tara Hurst is a 57 y.o. female.  57 year old female with history of schizoaffective disorder and hypertension presents to the emergency department for evaluation.  States that she was told by residents in her building that she was malodorous and should be evaluated.  Endorses anxiety and depression, but states they are at baseline.  She has been compliant with her medications.  Denies any suicidal or homicidal thoughts.  Has scheduled follow-up with her doctors in 2 days.  Further denies any pain complaints, injuries, sores.  The history is provided by the patient. No language interpreter was used.  Anxiety   Depression     Past Medical History:  Diagnosis Date  . Hypertension   . Schizoaffective disorder [facility])     Patient Active Problem List   Diagnosis Date Noted  . Tachycardia 12/03/2017  . Acute lower UTI 12/03/2017  . GAD (generalized anxiety disorder) 10/19/2017  . Anxiety   . UTI due to Klebsiella species   . Hypernatremia   . Acute encephalopathy 05/03/2017  . ARF (acute renal failure) ([place]) 05/03/2017  . HCAP (healthcare-associated pneumonia) 09/25/2013  . Hypertension 09/25/2013  . Abnormal lithium level in blood 09/25/2013    History reviewed. No pertinent surgical history.   OB History   None      Home Medications    Prior to Admission medications   Medication Sig Start Date End Date Taking? Authorizing Provider  benztropine (COGENTIN) 0.5 MG tablet Take 1 tablet (0.5 mg total) by mouth 2 (two) times daily. Patient not taking: Reported on 12/11/2017 09/28/13   Kinnie Feil, MD  buPROPion (WELLBUTRIN XL) 300 MG 24 hr tablet Take 300 mg daily by mouth.    [provider]  LORazepam (ATIVAN) 1 MG tablet Take 1mg  by  mouth in the morning and take 2mg  by mouth at bedtime Patient not taking: Reported on 12/11/2017 12/04/17   Aline August, MD  LORazepam (ATIVAN) 1 MG tablet Take 1 tablet (1 mg total) by mouth every morning. 12/10/17   Davonna Belling, MD  losartan (COZAAR) 50 MG tablet Take 1 tablet by mouth daily.    [provider]  potassium chloride SA (K-DUR,KLOR-CON) 20 MEQ tablet Take 1 tablet (20 mEq total) by mouth 2 (two) times daily. 05/20/18   Antonietta Breach, PA-C  risperiDONE (RISPERDAL) 3 MG tablet Take 1 tablet (3 mg total) by mouth 2 (two) times daily. Patient taking differently: Take 6 mg by mouth at bedtime.  12/04/17   Aline August, MD    Family History Family History  Problem Relation Age of Onset  . Heart disease Mother   . Diabetic kidney disease Mother     Social History Social History   Tobacco Use  . Smoking status: Never Smoker  . Smokeless tobacco: Never Used  Substance Use Topics  . Alcohol use: No  . Drug use: No     Allergies   Patient has no known allergies.   Review of Systems Review of Systems  Psychiatric/Behavioral: Positive for depression.   Ten systems reviewed and are negative for acute change, except as noted in the HPI.    Physical Exam Updated Vital Signs BP (!) 156/105 (BP Location: Right Arm)   Pulse 88   Temp 98.6  F (37 C) (Oral)   Resp 16   SpO2 99%   Physical Exam  Constitutional: She is oriented to person, place, and time. She appears well-developed and well-nourished. No distress.  Nontoxic appearing  HENT:  Head: Normocephalic and atraumatic.  Eyes: Conjunctivae and EOM are normal. No scleral icterus.  Neck: Normal range of motion.  Pulmonary/Chest: Effort normal. No respiratory distress.  Musculoskeletal: Normal range of motion.  Neurological: She is alert and oriented to person, place, and time. She exhibits normal muscle tone. Coordination normal.  Skin: Skin is warm and dry. No rash noted. She is not diaphoretic.  No erythema. No pallor.  Psychiatric: Her mood appears anxious (mild). She is slowed. She expresses no homicidal and no suicidal ideation.  Denies SI/HI  Nursing note and vitals reviewed.    ED Treatments / Results  Labs (all labs ordered are listed, but only abnormal results are displayed) Labs Reviewed  CBC WITH DIFFERENTIAL/PLATELET - Abnormal; Notable for the following components:      Result Value   Hemoglobin 11.3 (*)    HCT 35.2 (*)    All other components within normal limits  BASIC METABOLIC PANEL - Abnormal; Notable for the following components:   Potassium 2.6 (*)    Glucose, Bld 104 (*)    BUN 5 (*)    Creatinine, Ser 1.33 (*)    Calcium 10.4 (*)    GFR calc non Af Amer 43 (*)    GFR calc Af Amer 50 (*)    All other components within normal limits  ACETAMINOPHEN LEVEL - Abnormal; Notable for the following components:   Acetaminophen (Tylenol), Serum <10 (*)    All other components within normal limits  SALICYLATE LEVEL  ETHANOL    EKG EKG Interpretation  Date/Time:  Monday May 20 2018 05:08:55 EST Ventricular Rate:  96 PR Interval:    QRS Duration: 84 QT Interval:  346 QTC Calculation: 438 R Axis:   -12 Text Interpretation:  Sinus rhythm Probable left atrial enlargement Artifact Otherwise no significant change Confirmed by Addison Lank [phone]) on 05/20/2018 5:15:37 AM   Radiology No results found.  Procedures Procedures (including critical care time)  Medications Ordered in ED Medications  potassium chloride SA (K-DUR,KLOR-CON) CR tablet 40 mEq (40 mEq Oral Given 05/20/18 0510)     Initial Impression / Assessment and Plan / ED Course  I have reviewed the triage vital signs and the nursing notes.  Pertinent labs & imaging results that were available during my care of the patient were reviewed by me and considered in my medical decision making (see chart for details).     Patient presents for evaluation.  States that she was told by  residents in her building that she was malodorous and should be evaluated.  Has no physical complaints or complaints of pain.  Notes baseline anxiety and depression without worsening.  She has been compliant with her medications.  Denies suicidal and homicidal thoughts.  Screening labs ordered in triage which note hypokalemia.  Does run slightly low at baseline.  Was given supplemental potassium in the ED.  Will send home with 5-day course of potassium tablets.  Encouraged primary care follow-up.  I do not believe further emergent evaluation is indicated at this time.  Return precautions discussed and provided. Patient discharged in stable condition with no unaddressed concerns.   Final Clinical Impressions(s) / ED Diagnoses   Final diagnoses:  Hypokalemia  Anxiety    ED Discharge Orders  Ordered    potassium chloride SA (K-DUR,KLOR-CON) 20 MEQ tablet  2 times daily     05/20/18 0439           Antonietta Breach, PA-C 05/20/18 0518    Fatima Blank, MD 05/20/18 [phone]

## 2018-06-06 ENCOUNTER — Encounter (HOSPITAL_COMMUNITY): Payer: Self-pay | Admitting: Emergency Medicine

## 2018-06-06 ENCOUNTER — Other Ambulatory Visit: Payer: Self-pay

## 2018-06-06 ENCOUNTER — Emergency Department (HOSPITAL_COMMUNITY): Payer: Medicaid Other

## 2018-06-06 ENCOUNTER — Inpatient Hospital Stay (HOSPITAL_COMMUNITY)
Admission: EM | Admit: 2018-06-06 | Discharge: 2018-06-14 | DRG: 871 | Disposition: A | Discharge status: Skilled Nursing Facility | Payer: Medicaid Other | Attending: Internal Medicine | Admitting: Internal Medicine

## 2018-06-06 DIAGNOSIS — R6510 Systemic inflammatory response syndrome (SIRS) of non-infectious origin without acute organ dysfunction: Secondary | ICD-10-CM | POA: Diagnosis present

## 2018-06-06 DIAGNOSIS — F411 Generalized anxiety disorder: Secondary | ICD-10-CM | POA: Diagnosis present

## 2018-06-06 DIAGNOSIS — Z841 Family history of disorders of kidney and ureter: Secondary | ICD-10-CM | POA: Diagnosis not present

## 2018-06-06 DIAGNOSIS — I444 Left anterior fascicular block: Secondary | ICD-10-CM | POA: Diagnosis present

## 2018-06-06 DIAGNOSIS — Z8249 Family history of ischemic heart disease and other diseases of the circulatory system: Secondary | ICD-10-CM

## 2018-06-06 DIAGNOSIS — F259 Schizoaffective disorder, unspecified: Secondary | ICD-10-CM | POA: Diagnosis present

## 2018-06-06 DIAGNOSIS — I10 Essential (primary) hypertension: Secondary | ICD-10-CM | POA: Diagnosis present

## 2018-06-06 DIAGNOSIS — E876 Hypokalemia: Secondary | ICD-10-CM | POA: Diagnosis present

## 2018-06-06 DIAGNOSIS — D72829 Elevated white blood cell count, unspecified: Secondary | ICD-10-CM

## 2018-06-06 DIAGNOSIS — Z833 Family history of diabetes mellitus: Secondary | ICD-10-CM | POA: Diagnosis not present

## 2018-06-06 DIAGNOSIS — E440 Moderate protein-calorie malnutrition: Secondary | ICD-10-CM

## 2018-06-06 DIAGNOSIS — I129 Hypertensive chronic kidney disease with stage 1 through stage 4 chronic kidney disease, or unspecified chronic kidney disease: Secondary | ICD-10-CM | POA: Diagnosis present

## 2018-06-06 DIAGNOSIS — D649 Anemia, unspecified: Secondary | ICD-10-CM | POA: Diagnosis present

## 2018-06-06 DIAGNOSIS — E860 Dehydration: Secondary | ICD-10-CM | POA: Diagnosis present

## 2018-06-06 DIAGNOSIS — N179 Acute kidney failure, unspecified: Secondary | ICD-10-CM | POA: Diagnosis present

## 2018-06-06 DIAGNOSIS — Z79899 Other long term (current) drug therapy: Secondary | ICD-10-CM

## 2018-06-06 DIAGNOSIS — A419 Sepsis, unspecified organism: Secondary | ICD-10-CM | POA: Diagnosis not present

## 2018-06-06 DIAGNOSIS — E870 Hyperosmolality and hypernatremia: Secondary | ICD-10-CM | POA: Diagnosis present

## 2018-06-06 DIAGNOSIS — G9341 Metabolic encephalopathy: Secondary | ICD-10-CM | POA: Diagnosis present

## 2018-06-06 DIAGNOSIS — E871 Hypo-osmolality and hyponatremia: Secondary | ICD-10-CM | POA: Diagnosis present

## 2018-06-06 DIAGNOSIS — R000 Tachycardia, unspecified: Secondary | ICD-10-CM | POA: Diagnosis present

## 2018-06-06 DIAGNOSIS — D696 Thrombocytopenia, unspecified: Secondary | ICD-10-CM | POA: Diagnosis present

## 2018-06-06 DIAGNOSIS — G9340 Encephalopathy, unspecified: Secondary | ICD-10-CM | POA: Diagnosis not present

## 2018-06-06 DIAGNOSIS — I959 Hypotension, unspecified: Secondary | ICD-10-CM | POA: Diagnosis present

## 2018-06-06 DIAGNOSIS — N183 Chronic kidney disease, stage 3 (moderate): Secondary | ICD-10-CM | POA: Diagnosis present

## 2018-06-06 LAB — COMPREHENSIVE METABOLIC PANEL
ALT: 19 U/L (ref 0–44)
AST: 23 U/L (ref 15–41)
Albumin: 5.8 g/dL — ABNORMAL HIGH (ref 3.5–5.0)
Alkaline Phosphatase: 97 U/L (ref 38–126)
Anion gap: 25 — ABNORMAL HIGH (ref 5–15)
BUN: 103 mg/dL — ABNORMAL HIGH (ref 6–20)
CHLORIDE: 119 mmol/L — AB (ref 98–111)
CO2: 25 mmol/L (ref 22–32)
CREATININE: 5.03 mg/dL — AB (ref 0.44–1.00)
Calcium: 12.1 mg/dL — ABNORMAL HIGH (ref 8.9–10.3)
GFR calc non Af Amer: 9 mL/min — ABNORMAL LOW (ref >60)
GFR, EST AFRICAN AMERICAN: 10 mL/min — AB (ref >60)
Glucose, Bld: 133 mg/dL — ABNORMAL HIGH (ref 70–99)
POTASSIUM: 4.2 mmol/L (ref 3.5–5.1)
Sodium: 169 mmol/L (ref 135–145)
Total Bilirubin: 2 mg/dL — ABNORMAL HIGH (ref 0.3–1.2)
Total Protein: 9.9 g/dL — ABNORMAL HIGH (ref 6.5–8.1)

## 2018-06-06 LAB — BASIC METABOLIC PANEL
Anion gap: 14 (ref 5–15)
Anion gap: 14 (ref 5–15)
BUN: 97 mg/dL — ABNORMAL HIGH (ref 6–20)
BUN: 90 mg/dL — ABNORMAL HIGH (ref 6–20)
CO2: 22 mmol/L (ref 22–32)
CO2: 24 mmol/L (ref 22–32)
Calcium: 9.8 mg/dL (ref 8.9–10.3)
Calcium: 9.9 mg/dL (ref 8.9–10.3)
Chloride: 130 mmol/L — ABNORMAL HIGH (ref 98–111)
Chloride: 126 mmol/L — ABNORMAL HIGH (ref 98–111)
Creatinine, Ser: 4.42 mg/dL — ABNORMAL HIGH (ref 0.44–1.00)
Creatinine, Ser: 4 mg/dL — ABNORMAL HIGH (ref 0.44–1.00)
GFR calc Af Amer: 12 mL/min — ABNORMAL LOW (ref >60)
GFR calc Af Amer: 14 mL/min — ABNORMAL LOW (ref >60)
GFR calc non Af Amer: 10 mL/min — ABNORMAL LOW (ref >60)
GFR calc non Af Amer: 12 mL/min — ABNORMAL LOW (ref >60)
Glucose, Bld: 188 mg/dL — ABNORMAL HIGH (ref 70–99)
Glucose, Bld: 101 mg/dL — ABNORMAL HIGH (ref 70–99)
Potassium: 4.2 mmol/L (ref 3.5–5.1)
Potassium: 3.7 mmol/L (ref 3.5–5.1)
SODIUM: 166 mmol/L — AB (ref 135–145)
Sodium: 164 mmol/L (ref 135–145)

## 2018-06-06 LAB — URINALYSIS, ROUTINE W REFLEX MICROSCOPIC
Bilirubin Urine: NEGATIVE
Glucose, UA: NEGATIVE mg/dL
Hgb urine dipstick: NEGATIVE
KETONES UR: NEGATIVE mg/dL
Leukocytes, UA: NEGATIVE
Nitrite: NEGATIVE
PROTEIN: 30 mg/dL — AB
Specific Gravity, Urine: 1.014 (ref 1.005–1.030)
pH: 5 (ref 5.0–8.0)

## 2018-06-06 LAB — CBC
HCT: 49.1 % — ABNORMAL HIGH (ref 36.0–46.0)
Hemoglobin: 14.7 g/dL (ref 12.0–15.0)
MCH: 26.3 pg (ref 26.0–34.0)
MCHC: 29.9 g/dL — ABNORMAL LOW (ref 30.0–36.0)
MCV: 87.7 fL (ref 80.0–100.0)
Platelets: 284 10*3/uL (ref 150–400)
RBC: 5.6 MIL/uL — ABNORMAL HIGH (ref 3.87–5.11)
RDW: 15.3 % (ref 11.5–15.5)
WBC: 18.7 10*3/uL — ABNORMAL HIGH (ref 4.0–10.5)
nRBC: 0 % (ref 0.0–0.2)

## 2018-06-06 LAB — MAGNESIUM: Magnesium: 3.9 mg/dL — ABNORMAL HIGH (ref 1.7–2.4)

## 2018-06-06 LAB — I-STAT BETA HCG BLOOD, ED (MC, WL, AP ONLY): I-stat hCG, quantitative: <5 m[IU]/mL (ref <5)

## 2018-06-06 LAB — RAPID URINE DRUG SCREEN, HOSP PERFORMED
AMPHETAMINES: NOT DETECTED
BARBITURATES: NOT DETECTED
Benzodiazepines: NOT DETECTED
Cocaine: NOT DETECTED
Opiates: NOT DETECTED
Tetrahydrocannabinol: NOT DETECTED

## 2018-06-06 LAB — CBG MONITORING, ED: Glucose-Capillary: 126 mg/dL — ABNORMAL HIGH (ref 70–99)

## 2018-06-06 LAB — I-STAT CG4 LACTIC ACID, ED
Lactic Acid, Venous: 4.97 mmol/L (ref 0.5–1.9)
Lactic Acid, Venous: 3.77 mmol/L (ref 0.5–1.9)

## 2018-06-06 LAB — ETHANOL: Alcohol, Ethyl (B): <10 mg/dL (ref <10)

## 2018-06-06 LAB — MRSA PCR SCREENING: MRSA by PCR: NEGATIVE

## 2018-06-06 LAB — LACTIC ACID, PLASMA: Lactic Acid, Venous: 2.6 mmol/L (ref 0.5–1.9)

## 2018-06-06 LAB — I-STAT TROPONIN, ED: Troponin i, poc: 0.06 ng/mL (ref 0.00–0.08)

## 2018-06-06 LAB — CK: Total CK: 76 U/L (ref 38–234)

## 2018-06-06 MED ORDER — SODIUM CHLORIDE 0.9 % IV BOLUS (SEPSIS)
1000.0000 mL | Freq: Once | INTRAVENOUS | Status: DC
  Administered 2018-06-06: 1000 mL via INTRAVENOUS

## 2018-06-06 MED ORDER — ONDANSETRON HCL 4 MG PO TABS
4.0000 mg | ORAL_TABLET | Freq: Four times a day (QID) | ORAL | Status: DC | PRN
  Filled 2018-06-06: qty 1

## 2018-06-06 MED ORDER — SODIUM CHLORIDE 0.9 % IV BOLUS
1000.0000 mL | Freq: Once | INTRAVENOUS | Status: AC
  Administered 2018-06-06: 1000 mL via INTRAVENOUS

## 2018-06-06 MED ORDER — SODIUM CHLORIDE 0.9 % IV SOLN
INTRAVENOUS | Status: DC | PRN
  Administered 2018-06-06 – 2018-06-07 (×2): 500 mL via INTRAVENOUS

## 2018-06-06 MED ORDER — ACETAMINOPHEN 325 MG PO TABS: 650.0000 mg | ORAL_TABLET | Freq: Four times a day (QID) | ORAL | Status: DC | PRN

## 2018-06-06 MED ORDER — SODIUM CHLORIDE 0.9 % IV BOLUS (SEPSIS): 1000.0000 mL | Freq: Once | INTRAVENOUS | Status: DC

## 2018-06-06 MED ORDER — ONDANSETRON HCL 4 MG/2ML IJ SOLN: 4.0000 mg | Freq: Four times a day (QID) | INTRAMUSCULAR | Status: DC | PRN

## 2018-06-06 MED ORDER — SODIUM CHLORIDE 0.9 % IV SOLN
2.0000 g | Freq: Once | INTRAVENOUS | Status: DC
  Filled 2018-06-06: qty 2

## 2018-06-06 MED ORDER — SODIUM CHLORIDE 0.9 % IV BOLUS (SEPSIS): 250.0000 mL | Freq: Once | INTRAVENOUS | Status: DC

## 2018-06-06 MED ORDER — VANCOMYCIN HCL 10 G IV SOLR
1500.0000 mg | INTRAVENOUS | Status: AC
  Administered 2018-06-06: 1500 mg via INTRAVENOUS
  Filled 2018-06-06: qty 1500

## 2018-06-06 MED ORDER — SODIUM CHLORIDE 0.9 % IV SOLN
INTRAVENOUS | Status: DC | PRN
  Administered 2018-06-06: 500 mL via INTRAVENOUS

## 2018-06-06 MED ORDER — VANCOMYCIN VARIABLE DOSE PER UNSTABLE RENAL FUNCTION (PHARMACIST DOSING): Status: DC

## 2018-06-06 MED ORDER — METRONIDAZOLE IN NACL 5-0.79 MG/ML-% IV SOLN
500.0000 mg | Freq: Three times a day (TID) | INTRAVENOUS | Status: DC
  Administered 2018-06-06 – 2018-06-10 (×11): 500 mg via INTRAVENOUS
  Filled 2018-06-06 (×11): qty 100

## 2018-06-06 MED ORDER — ACETAMINOPHEN 650 MG RE SUPP
650.0000 mg | Freq: Four times a day (QID) | RECTAL | Status: DC | PRN
  Administered 2018-06-06: 650 mg via RECTAL
  Filled 2018-06-06: qty 1

## 2018-06-06 MED ORDER — DEXTROSE 5 % IV SOLN
INTRAVENOUS | Status: AC
  Administered 2018-06-06: 18:00:00 via INTRAVENOUS

## 2018-06-06 MED ORDER — HEPARIN SODIUM (PORCINE) 5000 UNIT/ML IJ SOLN
5000.0000 [IU] | Freq: Three times a day (TID) | INTRAMUSCULAR | Status: DC
  Administered 2018-06-06 – 2018-06-07 (×4): 5000 [IU] via SUBCUTANEOUS
  Filled 2018-06-06 (×5): qty 1

## 2018-06-06 MED ORDER — SODIUM CHLORIDE 0.45 % IV BOLUS: 1000.0000 mL | Freq: Once | INTRAVENOUS | Status: DC

## 2018-06-06 MED ORDER — SODIUM CHLORIDE 0.9 % IV SOLN
1.0000 g | INTRAVENOUS | Status: DC
  Administered 2018-06-06 – 2018-06-09 (×4): 1 g via INTRAVENOUS
  Filled 2018-06-06 (×4): qty 1

## 2018-06-06 MED ORDER — DEXTROSE 5 % IV SOLN: INTRAVENOUS | Status: DC

## 2018-06-06 MED ORDER — DEXTROSE 5 % IV SOLN
INTRAVENOUS | Status: DC
  Administered 2018-06-07 (×4): via INTRAVENOUS

## 2018-06-06 MED ORDER — VANCOMYCIN HCL IN DEXTROSE 1-5 GM/200ML-% IV SOLN: 1000.0000 mg | Freq: Once | INTRAVENOUS | Status: DC

## 2018-06-06 MED ORDER — SODIUM CHLORIDE 0.45 % IV BOLUS
1000.0000 mL | Freq: Once | INTRAVENOUS | Status: AC
  Administered 2018-06-06: 1000 mL via INTRAVENOUS

## 2018-06-06 NOTE — Progress Notes (Signed)
Admission note: Pt arrived, AxO x 1.  Shivering, multiple scabs and bruises on knees, feet, elbows, and arms.  Tara Hurst [id number] Admission Data: 06/06/2018 5:33 PM Attending Provider: Reubin Milan, MD  GBT:[id number], Iona Beard, MD Consults/ Treatment Team:   DAIANNA Hurst is a 57 y.o. female patient admitted from ED awake, alert  & orientated  X 3,  Full Code, VSS - Blood pressure (!) 121/95, pulse (!) 123, temperature 98.7 F (37.1 C), temperature source Rectal, resp. rate 12, height 5\' 4"  (1.626 m), weight 70.3 kg, SpO2 100 %.,  RA, no c/o shortness of breath, no c/o chest pain, no distress noted. MC5W M07 placed and pt is currently running:111.   IV site WDL: right AC, left hand with a transparent dsgs that are clean dry and intact.  Allergies:  No Known Allergies   Past Medical History:  Diagnosis Date  . Hypertension   . Schizoaffective disorder ([place])       Pt orientation to unit, room and routine. Information packet given to patient/family and safety video watched.  Admission INP armband ID verified with patient/family, and in place. SR up x 2, fall risk assessment complete with Patient and family verbalizing understanding of risks associated with falls. Pt verbalizes an understanding of how to use the call bell and to call for help before getting out of bed.  Skin, clean-dry- intact without evidence of bruising, or skin tears.        Will cont to monitor and assist as needed.  Hassan Rowan, RN 06/06/2018 5:33 PM

## 2018-06-06 NOTE — ED Notes (Signed)
Admitting at bedside 

## 2018-06-06 NOTE — ED Triage Notes (Signed)
Pt arrived via [facility] from home after wellness check by social worker that stops in to visit her every 2 weeks. LSN 2 weeks prior per EMS. Pt has equal strength bilaterally with minimal communication. Pt is able to follow commands.

## 2018-06-06 NOTE — ED Notes (Signed)
DISREGARD ALL CHARTING BY [place] Mokry- ERROR- NOT CORRECT PATIENT

## 2018-06-06 NOTE — H&P (Signed)
4        History and Physical    Tara Hurst XBD:[id number] DOB: [birth year] DOA: 06/06/2018  PCP: [name], MD   Patient coming from: Home.  I have personally briefly reviewed patient's old medical records in [place]  Chief Complaint: AMS.  HPI: Tara Hurst is a 57 y.o. female with medical history significant of hypertension, generalized anxiety disorder, schizoaffective disorder, history of benzodiazepine use who was found by by social worker who went to perform a wellness check.  She was found on dressed on her bed/couch.  The last time she was known to be normal was about 2 weeks ago, when she was seen during a social work Tax adviser.  She is unable to provide further history at this time.  ED Course: Initial vital signs temperature 97.5 F, pulse 122, respirations 21, blood pressure 115/94 mmHg O2 sat 99% on room.  He received fluids, cefepime and vancomycin.  UDS was negative.  Urinalysis shows hazy appearance, rare bacteria and 30 mg/dL of proteinuria.  White count is 18.7, hemoglobin 14.7 g/dL and platelets 284.  Her hemoglobin level is usually around 11 g/dL.  Troponin was normal.  Lactic acid #1 was 4.97 and lactic acid #2 was 3.77 mmol/L.  Sodium 169, potassium 4.2, chloride 119 and CO2 25 mmol/L.  Bilirubin 2.0 g/dL, calcium 12.1, glucose 133, BUN 103 and creatinine 5.03 mg/dL.  Total protein was 9.9 and albumin 5.8 g/dL.  Transaminases are within normal limits.  Chest radiograph did not show any active cardiopulmonary disease.  CT head without contrast did not show any acute intracranial pathology.  See images on full cardiology report for further detail.  Review of Systems: Unable to obtain.  Past Medical History:  Diagnosis Date  . Hypertension   . Schizoaffective disorder ([place])     History reviewed. No pertinent surgical history.   reports that she has never smoked. She has never used smokeless tobacco. She reports that she does not drink  alcohol or use drugs.  No Known Allergies  Family History  Problem Relation Age of Onset  . Heart disease Mother   . Diabetic kidney disease Mother    Prior to Admission medications   Medication Sig Start Date End Date Taking? Authorizing Provider  buPROPion (WELLBUTRIN XL) 150 MG 24 hr tablet Take 450 mg by mouth daily.    Yes [provider]  losartan (COZAAR) 50 MG tablet Take 1 tablet by mouth daily.   Yes [provider]  Lurasidone HCl (LATUDA) 60 MG TABS Take 60 mg by mouth daily.   Yes [provider]  propranolol (INDERAL) 10 MG tablet Take 10 mg by mouth 2 (two) times daily.   Yes [provider]  benztropine (COGENTIN) 0.5 MG tablet Take 1 tablet (0.5 mg total) by mouth 2 (two) times daily. Patient not taking: Reported on 12/11/2017 09/28/13   Kinnie Feil, MD  LORazepam (ATIVAN) 1 MG tablet Take 1mg  by mouth in the morning and take 2mg  by mouth at bedtime Patient not taking: Reported on 12/11/2017 12/04/17   Aline August, MD  LORazepam (ATIVAN) 1 MG tablet Take 1 tablet (1 mg total) by mouth every morning. Patient not taking: Reported on 06/06/2018 12/10/17   Davonna Belling, MD  potassium chloride SA (K-DUR,KLOR-CON) 20 MEQ tablet Take 1 tablet (20 mEq total) by mouth 2 (two) times daily. Patient not taking: Reported on 06/06/2018 05/20/18   Antonietta Breach, PA-C  risperiDONE (RISPERDAL) 3 MG tablet  Take 1 tablet (3 mg total) by mouth 2 (two) times daily. Patient not taking: Reported on 06/06/2018 12/04/17   Aline August, MD    Physical Exam: Vitals:   06/06/18 1537 06/06/18 1545 06/06/18 1630 06/06/18 1700  BP:  (!) 122/99 (!) 121/95 119/78  Pulse:   (!) 123 (!) 124  Resp:    18  Temp: 98.7 F (37.1 C)   97.6 F (36.4 C)  TempSrc: Rectal   Oral  SpO2:   100% 98%  Weight:    70.3 kg  Height:    5\' 5"  (1.651 m)    Constitutional: Somnolent, but in NAD. Eyes: PERRL, lids and conjunctivae normal ENMT: Mucous membranes are  severely dry.  Her lips look cracked.  Posterior pharynx clear of any exudate or lesions. Neck: normal, supple, no masses, no thyromegaly Respiratory: Decreased breath sounds on bases, otherwise clear to auscultation bilaterally, no wheezing, no crackles. Normal respiratory effort. No accessory muscle use.  Cardiovascular: Regular rate and rhythm, no murmurs / rubs / gallops. No extremity edema. 2+ pedal pulses. No carotid bruits.  Abdomen: Soft, no tenderness, no masses palpated. No hepatosplenomegaly. Bowel sounds positive.  Musculoskeletal: no clubbing / cyanosis.  Good ROM, no contractures.  Mildly relaxed muscle tone.  Skin: no rashes, lesions, ulcers on limited dermatological examination. Neurologic: Somnolent.  Moves all extremities. Psychiatric:  Alert and oriented x 1.   Labs on Admission: I have personally reviewed following labs and imaging studies  CBC: Recent Labs  Lab 06/06/18 1322  WBC 18.7*  HGB 14.7  HCT 49.1*  MCV 87.7  PLT 151   Basic Metabolic Panel: Recent Labs  Lab 06/06/18 1322 06/06/18 1841  NA 169* 166*  K 4.2 4.2  CL 119* 130*  CO2 25 22  GLUCOSE 133* 188*  BUN 103* 97*  CREATININE 5.03* 4.42*  CALCIUM 12.1* 9.8  MG 3.9*  --    GFR: Estimated Creatinine Clearance: 13.8 mL/min (A) (by C-G formula based on SCr of 4.42 mg/dL (H)). Liver Function Tests: Recent Labs  Lab 06/06/18 1322  AST 23  ALT 19  ALKPHOS 97  BILITOT 2.0*  PROT 9.9*  ALBUMIN 5.8*   No results for input(s): LIPASE, AMYLASE in the last 168 hours. No results for input(s): AMMONIA in the last 168 hours. Coagulation Profile: No results for input(s): INR, PROTIME in the last 168 hours. Cardiac Enzymes: No results for input(s): CKTOTAL, CKMB, CKMBINDEX, TROPONINI in the last 168 hours. BNP (last 3 results) No results for input(s): PROBNP in the last 8760 hours. HbA1C: No results for input(s): HGBA1C in the last 72 hours. CBG: Recent Labs  Lab 06/06/18 1227  GLUCAP 126*    Lipid Profile: No results for input(s): CHOL, HDL, LDLCALC, TRIG, CHOLHDL, LDLDIRECT in the last 72 hours. Thyroid Function Tests: No results for input(s): TSH, T4TOTAL, FREET4, T3FREE, THYROIDAB in the last 72 hours. Anemia Panel: No results for input(s): VITAMINB12, FOLATE, FERRITIN, TIBC, IRON, RETICCTPCT in the last 72 hours. Urine analysis:    Component Value Date/Time   COLORURINE YELLOW 06/06/2018 1542   APPEARANCEUR HAZY (A) 06/06/2018 1542   LABSPEC 1.014 06/06/2018 1542   PHURINE 5.0 06/06/2018 1542   GLUCOSEU NEGATIVE 06/06/2018 1542   HGBUR NEGATIVE 06/06/2018 1542   BILIRUBINUR NEGATIVE 06/06/2018 1542   KETONESUR NEGATIVE 06/06/2018 1542   PROTEINUR 30 (A) 06/06/2018 1542   UROBILINOGEN 0.2 09/25/2013 1322   NITRITE NEGATIVE 06/06/2018 1542   LEUKOCYTESUR NEGATIVE 06/06/2018 [place]  on Admission: Dg Chest 2 View  Result Date: 06/06/2018 CLINICAL DATA:  Altered mental status. EXAM: CHEST - 2 VIEW COMPARISON:  CT chest and chest x-ray dated October 18, 2017. FINDINGS: The heart size and mediastinal contours are within normal limits. Normal pulmonary vascularity. No focal consolidation, pleural effusion, or pneumothorax. No acute osseous abnormality. Foot IMPRESSION: No active cardiopulmonary disease. Electronically Signed   By: Titus Dubin M.D.   On: 06/06/2018 13:04   Ct Head Wo Contrast  Result Date: 06/06/2018 CLINICAL DATA:  Altered mental status EXAM: CT HEAD WITHOUT CONTRAST TECHNIQUE: Contiguous axial images were obtained from the base of the skull through the vertex without intravenous contrast. COMPARISON:  12/03/2017 FINDINGS: Brain: No evidence of acute infarction, hemorrhage, hydrocephalus, extra-axial collection or mass lesion/mass effect. Vascular: No hyperdense vessel or unexpected calcification. Skull: Normal. Negative for fracture or focal lesion. Sinuses/Orbits: No acute finding. Other: None. IMPRESSION: No acute intracranial  abnormality noted. Electronically Signed   By: Inez Catalina M.D.   On: 06/06/2018 13:37    EKG: Independently reviewed. Vent. rate 130 BPM PR interval * ms QRS duration 70 ms QT/QTc 321/472 ms P-R-T axes 86 -78 84 Sinus tachycardia Consider right atrial enlargement Left anterior fascicular block Anterior infarct, old  Assessment/Plan Principal Problem:   Hypernatremia Water deficit is 3.3 L. Admit to progressive unit/amputation. Continue dextrose infusion. Monitor sodium and renal function closely. Monitor intake and output.  Active Problems:   AKI (acute kidney injury) ([place]) This is prerenal. Check total CK. Continue IV fluids. Monitor intake and output. Follow-up renal function and electrolytes    Sepsis due to undetermined organism ([place]) Continue vancomycin and cefepime per pharmacy. Leukocytosis and increased lactic acid could also be due to hemoconcentration. Consider discontinuing antibiotics if above resolves or improves. Follow-up blood cultures and sensitivity.    Hypertension Hold antihypertensives for now. Monitor blood pressure.    Acute encephalopathy Neurochecks every 4 hours. Secondary to sepsis and hypernatremia. Continue treatment of those 2 conditions.    Tachycardia Continue IV hydration. Continue cardio monitoring.     DVT prophylaxis: SQ heparin. Code Status: Full code. Family Communication: None bedside. Disposition Plan: Admit for IV hydration and IV antibiotic therapy. Consults called:  Admission status: Inpatient/progressive unit.  Reubin Milan MD [facility] Pager [phone].  If 7PM-7AM, please contact night-coverage [email or web address] Password [email or web address]  06/06/2018, 7:35 PM

## 2018-06-06 NOTE — ED Notes (Signed)
Notified EDP IV no longer working, will place IV team consult. Lab at bedside attempting to collect blood cultures.

## 2018-06-06 NOTE — Progress Notes (Signed)
This note also relates to the following rows which could not be included: Pulse Rate - Cannot attach notes to unvalidated device data ECG Heart Rate - Cannot attach notes to unvalidated device data Resp - Cannot attach notes to unvalidated device data SpO2 - Cannot attach notes to unvalidated device data

## 2018-06-06 NOTE — ED Notes (Signed)
Patient transported to CT 

## 2018-06-06 NOTE — ED Provider Notes (Signed)
[facility] EMERGENCY DEPARTMENT Provider Note   CSN: [id number] Arrival date & time: 06/06/18  1203     History   Chief Complaint Chief Complaint  Patient presents with  . Altered Mental Status    HPI Tara Hurst is a 57 y.o. female with h/o schizoaffective disorder, anxiety, HTN, chronic benzodiazapine use, brought to ER via EMS for AMS.  History obtained from EMS and some from patient.  Per EMS social worker visited patient for wellness check, she was found undressed on bed/couch.  She has been moaning en route, tachycardic, not following commands.  Last known normal was 2 weeks ago by social work wellness check.  Pt is oriented to self, birth date. She keeps her eyes closed during exam. She denies pain. States she has not been eating much and has not taken any of her medications for several days.  She is spontaneously repositioning in bed without significant disability or difficulty. Level 5 caveat due to AMS vs psych illness.   HPI  Past Medical History:  Diagnosis Date  . Hypertension   . Schizoaffective disorder [facility])     Patient Active Problem List   Diagnosis Date Noted  . Tachycardia 12/03/2017  . Acute lower UTI 12/03/2017  . GAD (generalized anxiety disorder) 10/19/2017  . Anxiety   . UTI due to Klebsiella species   . Hypernatremia   . Acute encephalopathy 05/03/2017  . ARF (acute renal failure) ([facility]) 05/03/2017  . HCAP (healthcare-associated pneumonia) 09/25/2013  . Hypertension 09/25/2013  . Abnormal lithium level in blood 09/25/2013    History reviewed. No pertinent surgical history.   OB History   No obstetric history on file.      Home Medications    Prior to Admission medications   Medication Sig Start Date End Date Taking? Authorizing Provider  benztropine (COGENTIN) 0.5 MG tablet Take 1 tablet (0.5 mg total) by mouth 2 (two) times daily. Patient not taking: Reported on 12/11/2017 09/28/13   Kinnie Feil, MD  buPROPion  (WELLBUTRIN XL) 300 MG 24 hr tablet Take 300 mg daily by mouth.    [provider]  LORazepam (ATIVAN) 1 MG tablet Take 1mg  by mouth in the morning and take 2mg  by mouth at bedtime Patient not taking: Reported on 12/11/2017 12/04/17   Aline August, MD  LORazepam (ATIVAN) 1 MG tablet Take 1 tablet (1 mg total) by mouth every morning. 12/10/17   Davonna Belling, MD  losartan (COZAAR) 50 MG tablet Take 1 tablet by mouth daily.    [provider]  potassium chloride SA (K-DUR,KLOR-CON) 20 MEQ tablet Take 1 tablet (20 mEq total) by mouth 2 (two) times daily. 05/20/18   Antonietta Breach, PA-C  risperiDONE (RISPERDAL) 3 MG tablet Take 1 tablet (3 mg total) by mouth 2 (two) times daily. Patient taking differently: Take 6 mg by mouth at bedtime.  12/04/17   Aline August, MD    Family History Family History  Problem Relation Age of Onset  . Heart disease Mother   . Diabetic kidney disease Mother     Social History Social History   Tobacco Use  . Smoking status: Never Smoker  . Smokeless tobacco: Never Used  Substance Use Topics  . Alcohol use: No  . Drug use: No     Allergies   Patient has no known allergies.   Review of Systems Review of Systems  Unable to perform ROS: Mental status change     Physical Exam Updated Vital Signs  BP (!) 122/99   Pulse (!) 124   Temp 98.7 F (37.1 C) (Rectal)   Resp 12   Ht 5\' 4"  (1.626 m)   Wt 70.3 kg   SpO2 100%   BMI 26.60 kg/m   Physical Exam Vitals signs and nursing note reviewed.  Constitutional:      Appearance: She is well-developed.     Comments: Non toxic, unkept. Keeps eyes closed during exam.   HENT:     Head: Normocephalic and atraumatic.     Comments: No facial, scalp tenderness or obvious deformity. Poor dentition throughout. Lips are dry.  Collection of thick saliva to corners of the lips.  No intraoral or tongue injury.    Nose: Nose normal.  Eyes:     Conjunctiva/sclera: Conjunctivae normal.      Comments: PERRLA and EOMs intact. 2-3 mm reactive pupils, symmetric.   Neck:     Musculoskeletal: Normal range of motion.  Cardiovascular:     Rate and Rhythm: Regular rhythm. Tachycardia present.     Comments: 1+ radial and DP pulses bilaterally.  1+ pitting edema up to knees bilaterally.  No calf tenderness. Pulmonary:     Effort: Pulmonary effort is normal.     Breath sounds: Normal breath sounds.     Comments: Slight diminished lung sounds to lower bases bilaterally.  No crackles, wheezing.  Normal work of breathing. Abdominal:     General: Bowel sounds are normal.     Palpations: Abdomen is soft.     Tenderness: There is no abdominal tenderness.     Comments: No G/R/R. No suprapubic or CVA tenderness. Negative Murphy's and McBurney's. Active BS to lower quadrants.   Musculoskeletal: Normal range of motion.  Skin:    General: Skin is warm and dry.     Capillary Refill: Capillary refill takes less than 2 seconds.     Comments: Multiple bruises to lower extremities in different stages of healing  Neurological:     Mental Status: She is alert and oriented to person, place, and time.     Comments: Alert and oriented to self and birth date.  Speech is slowed but without obvious dysarthria or aphasia. Strength 5/5 in upper and lower extremities   Sensation to light touch intact in bilateral face, upper and lower extremities Can sit up for exam without truncal sway.  Unable to assess visual field due to somnolence, otherwise CN II-XII grossly intact bilaterally.   Psychiatric:        Behavior: Behavior normal.      ED Treatments / Results  Labs (all labs ordered are listed, but only abnormal results are displayed) Labs Reviewed  COMPREHENSIVE METABOLIC PANEL - Abnormal; Notable for the following components:      Result Value   Sodium 169 (*)    Chloride 119 (*)    Glucose, Bld 133 (*)    BUN 103 (*)    Creatinine, Ser 5.03 (*)    Calcium 12.1 (*)    Total Protein 9.9 (*)      Albumin 5.8 (*)    Total Bilirubin 2.0 (*)    GFR calc non Af Amer 9 (*)    GFR calc Af Amer 10 (*)    Anion gap 25 (*)    All other components within normal limits  CBC - Abnormal; Notable for the following components:   WBC 18.7 (*)    RBC 5.60 (*)    HCT 49.1 (*)    MCHC 29.9 (*)  All other components within normal limits  MAGNESIUM - Abnormal; Notable for the following components:   Magnesium 3.9 (*)    All other components within normal limits  CBG MONITORING, ED - Abnormal; Notable for the following components:   Glucose-Capillary 126 (*)    All other components within normal limits  I-STAT CG4 LACTIC ACID, ED - Abnormal; Notable for the following components:   Lactic Acid, Venous 4.97 (*)    All other components within normal limits  I-STAT CG4 LACTIC ACID, ED - Abnormal; Notable for the following components:   Lactic Acid, Venous 3.77 (*)    All other components within normal limits  URINE CULTURE  CULTURE, BLOOD (ROUTINE X 2)  CULTURE, BLOOD (ROUTINE X 2)  ETHANOL  URINALYSIS, ROUTINE W REFLEX MICROSCOPIC  RAPID URINE DRUG SCREEN, HOSP PERFORMED  BASIC METABOLIC PANEL  BASIC METABOLIC PANEL  BASIC METABOLIC PANEL  BASIC METABOLIC PANEL  BASIC METABOLIC PANEL  I-STAT BETA HCG BLOOD, ED ([facility], [facility], [facility] ONLY)  I-STAT TROPONIN, ED    EKG None  Radiology Dg Chest 2 View  Result Date: 06/06/2018 CLINICAL DATA:  Altered mental status. EXAM: CHEST - 2 VIEW COMPARISON:  CT chest and chest x-ray dated October 18, 2017. FINDINGS: The heart size and mediastinal contours are within normal limits. Normal pulmonary vascularity. No focal consolidation, pleural effusion, or pneumothorax. No acute osseous abnormality. Foot IMPRESSION: No active cardiopulmonary disease. Electronically Signed   By: Titus Dubin M.D.   On: 06/06/2018 13:04   Ct Head Wo Contrast  Result Date: 06/06/2018 CLINICAL DATA:  Altered mental status EXAM: CT HEAD WITHOUT CONTRAST TECHNIQUE: Contiguous  axial images were obtained from the base of the skull through the vertex without intravenous contrast. COMPARISON:  12/03/2017 FINDINGS: Brain: No evidence of acute infarction, hemorrhage, hydrocephalus, extra-axial collection or mass lesion/mass effect. Vascular: No hyperdense vessel or unexpected calcification. Skull: Normal. Negative for fracture or focal lesion. Sinuses/Orbits: No acute finding. Other: None. IMPRESSION: No acute intracranial abnormality noted. Electronically Signed   By: Inez Catalina M.D.   On: 06/06/2018 13:37    Procedures .Critical Care Performed by: Kinnie Feil, PA-C Authorized by: Kinnie Feil, PA-C   Critical care provider statement:    Critical care time (minutes):  45   Critical care was necessary to treat or prevent imminent or life-threatening deterioration of the following conditions:  Renal failure   Critical care was time spent personally by me on the following activities:  Discussions with consultants, evaluation of patient's response to treatment, examination of patient, ordering and performing treatments and interventions, ordering and review of laboratory studies, ordering and review of radiographic studies, pulse oximetry, re-evaluation of patient's condition, obtaining history from patient or surrogate, review of old charts, vascular access procedures and development of treatment plan with patient or surrogate   I assumed direction of critical care for this patient from another provider in my specialty: no     (including critical care time)  EMERGENCY DEPARTMENT  US GUIDANCE EXAM Emergency Ultrasound:  US Guidance for Needle Guidance  INDICATIONS: difficult IV  access Linear probe used in real-time to visualize location of needle entry through skin.   PERFORMED BY: Myself IMAGES ARCHIVED?: No LIMITATIONS: Pain VIEWS USED: Transverse and Longitudinal INTERPRETATION: Needle visualized within vein, Right arm and Left arm   Medications  Ordered in ED Medications  metroNIDAZOLE (FLAGYL) IVPB 500 mg (500 mg Intravenous New Bag/Given 06/06/18 1529)  vancomycin (VANCOCIN) 1,500 mg in sodium chloride 0.9 %  500 mL IVPB (1,500 mg Intravenous New Bag/Given 06/06/18 1537)  ceFEPIme (MAXIPIME) 1 g in sodium chloride 0.9 % 100 mL IVPB (has no administration in time range)  vancomycin variable dose per unstable renal function (pharmacist dosing) (has no administration in time range)  0.9 %  sodium chloride infusion (500 mLs Intravenous New Bag/Given 06/06/18 1528)  0.9 %  sodium chloride infusion (500 mLs Intravenous New Bag/Given 06/06/18 1535)  sodium chloride 0.45 % bolus 1,000 mL (has no administration in time range)  dextrose 5 % solution (has no administration in time range)  sodium chloride 0.9 % bolus 1,000 mL (1,000 mLs Intravenous New Bag/Given 06/06/18 1335)     Initial Impression / Assessment and Plan / ED Course  I have reviewed the triage vital signs and the nursing notes.  Pertinent labs & imaging results that were available during my care of the patient were reviewed by me and considered in my medical decision making (see chart for details).  Clinical Course as of Jun 06 1548  Thu Jun 06, 2018  1359 WBC(!): 18.7 [CG]  1359 Lactic Acid, Venous(!!): 4.97 [CG]  1359 Pulse Rate(!): 134 [CG]  1420 Sodium(!!): 169 [CG]  1420 Creatinine(!): 5.03 [CG]  1420 Magnesium(!): 3.9 [CG]    Clinical Course User Index [CG] Kinnie Feil, PA-C    Ddx includes infectious process vs head trauma vs polypharmacy vs BZD use vs metabolic/toxic etiology.  No obvious unilateral neuro deficits. She can follow basic commands.   1607: WBC 18.7, lactic 4.97, HR 134.  CXR w/o infection. Pending I&O for UA. Will initiate broad spectrum abx, IVF @ 30 cc/kg bolus for suspected SIRS with GU source.  Will need admission for acute kidney injury, SIRS response.   1538: Na 169.  [place] IV by me x 2 finally able to initiate IVF, abx. Blood  cultures sent. Spoke to Dr Olevia Bowens who has accepted patient.  Final Clinical Impressions(s) / ED Diagnoses   Final diagnoses:  Acute kidney injury [facility])  Hypernatremia    ED Discharge Orders    None       Arlean Hopping 06/06/18 1549    Charlesetta Shanks, MD 06/16/18 1549

## 2018-06-06 NOTE — ED Notes (Signed)
ED Provider at bedside. 

## 2018-06-06 NOTE — ED Notes (Signed)
Patient transported to X-ray 

## 2018-06-06 NOTE — ED Provider Notes (Signed)
Medical screening examination/treatment/procedure(s) were conducted as a shared visit with non-physician practitioner(s) and myself.  I personally evaluated the patient during the encounter.  EKG Interpretation  Date/Time:  Thursday June 06 2018 12:12:39 EST Ventricular Rate:  130 PR Interval:    QRS Duration: 70 QT Interval:  321 QTC Calculation: 472 R Axis:   -78 Text Interpretation:  Sinus tachycardia Consider right atrial enlargement Left anterior fascicular block Nonspecific T wave abnormality Confirmed by Lajean Saver [id number]) on 06/07/2018 2:17:13 PM  Found by social worker extremely confused and ill in appearance in her apartment.  There is very limited immediate history.  Reportedly patient was well in appearance 2 weeks ago but that is the last time she was seen.  Patient is very confused in appearance.  She is disheveled.  No obvious head injury.  [place] very dry.  Tachycardia.  1+ peripheral pulses.  Patient is not have respiratory distress.  No gross wheeze rhonchi rales.  Abdomen soft without guarding.  Patient answer some questions.  Speech is slow and confused.  He is moving extremities but not cooperative for full neurologic exam.  Patient presents acutely ill is outlined.  Sepsis protocol initiated.  I agree with plan and management.   Charlesetta Shanks, MD 06/16/18 [phone]

## 2018-06-06 NOTE — ED Notes (Signed)
Claudia PA attempting [place] IV.

## 2018-06-06 NOTE — Progress Notes (Addendum)
Pharmacy Antibiotic Note  Tara Hurst is a 57 y.o. female admitted on 06/06/2018 with sepsis.  Pharmacy has been consulted for Vancomycin and Cefepime dosing. Pt also on Flagyl.  SCr 5, estimated CrCl ~84ml/min. Noted that 3 weeks ago, pt's SCr was 1.33.  Plan: Cefepime 1gm IV q24h Vancomycin 1500mg  now  Will f/u SCr trend to schedule further doses Will f/u micro data, renal function, and pt's clinical condition Vanc levels prn   Height: 5\' 4"  (162.6 cm) Weight: 154 lb 15.7 oz (70.3 kg) IBW/kg (Calculated) : 54.7  Temp (24hrs), Avg:97.5 F (36.4 C), Min:97.5 F (36.4 C), Max:97.5 F (36.4 C)  Recent Labs  Lab 06/06/18 1322 06/06/18 1340  WBC 18.7*  --   LATICACIDVEN  --  4.97*    Estimated Creatinine Clearance: 44.9 mL/min (A) (by C-G formula based on SCr of 1.33 mg/dL (H)).    No Known Allergies  Antimicrobials this admission: 12/12 Vanc >>  12/12 Cefepime >>  12/12 Flagyl>>  Dose adjustments this admission: n/a  Microbiology results: 12/12 BCx:  12/12 UCx:    Thank you for allowing pharmacy to be a part of this patient's care.  Sherlon Handing, PharmD, BCPS Clinical pharmacist  **Pharmacist phone directory can now be found on [email or web address] ([email or web address]).  Listed under [place]. 06/06/2018 2:08 PM

## 2018-06-07 LAB — URINE CULTURE: Culture: NO GROWTH

## 2018-06-07 LAB — BASIC METABOLIC PANEL
Anion gap: 11 (ref 5–15)
BUN: 84 mg/dL — ABNORMAL HIGH (ref 6–20)
CO2: 26 mmol/L (ref 22–32)
Calcium: 9.7 mg/dL (ref 8.9–10.3)
Chloride: 123 mmol/L — ABNORMAL HIGH (ref 98–111)
Creatinine, Ser: 3.74 mg/dL — ABNORMAL HIGH (ref 0.44–1.00)
GFR calc Af Amer: 15 mL/min — ABNORMAL LOW (ref >60)
GFR calc non Af Amer: 13 mL/min — ABNORMAL LOW (ref >60)
Glucose, Bld: 153 mg/dL — ABNORMAL HIGH (ref 70–99)
Potassium: 3.3 mmol/L — ABNORMAL LOW (ref 3.5–5.1)
Sodium: 160 mmol/L — ABNORMAL HIGH (ref 135–145)

## 2018-06-07 LAB — BLOOD GAS, ARTERIAL
Acid-base deficit: 1.5 mmol/L (ref 0.0–2.0)
Bicarbonate: 22.7 mmol/L (ref 20.0–28.0)
Drawn by: 398981
FIO2: 21
O2 Saturation: 97.2 %
Patient temperature: 98.6
pCO2 arterial: 38.4 mmHg (ref 32.0–48.0)
pH, Arterial: 7.389 (ref 7.350–7.450)
pO2, Arterial: 88.1 mmHg (ref 83.0–108.0)

## 2018-06-07 LAB — COMPREHENSIVE METABOLIC PANEL
ALT: 13 U/L (ref 0–44)
ANION GAP: 12 (ref 5–15)
AST: 20 U/L (ref 15–41)
Albumin: 3.8 g/dL (ref 3.5–5.0)
Alkaline Phosphatase: 63 U/L (ref 38–126)
BUN: 78 mg/dL — ABNORMAL HIGH (ref 6–20)
CO2: 23 mmol/L (ref 22–32)
Calcium: 9.9 mg/dL (ref 8.9–10.3)
Chloride: 121 mmol/L — ABNORMAL HIGH (ref 98–111)
Creatinine, Ser: 3.62 mg/dL — ABNORMAL HIGH (ref 0.44–1.00)
GFR calc Af Amer: 15 mL/min — ABNORMAL LOW (ref >60)
GFR calc non Af Amer: 13 mL/min — ABNORMAL LOW (ref >60)
Glucose, Bld: 164 mg/dL — ABNORMAL HIGH (ref 70–99)
Potassium: 3.3 mmol/L — ABNORMAL LOW (ref 3.5–5.1)
Sodium: 156 mmol/L — ABNORMAL HIGH (ref 135–145)
TOTAL PROTEIN: 6.1 g/dL — AB (ref 6.5–8.1)
Total Bilirubin: 1.3 mg/dL — ABNORMAL HIGH (ref 0.3–1.2)

## 2018-06-07 LAB — CBC
HCT: 33.4 % — ABNORMAL LOW (ref 36.0–46.0)
Hemoglobin: 9.7 g/dL — ABNORMAL LOW (ref 12.0–15.0)
MCH: 25.5 pg — ABNORMAL LOW (ref 26.0–34.0)
MCHC: 29 g/dL — ABNORMAL LOW (ref 30.0–36.0)
MCV: 87.7 fL (ref 80.0–100.0)
NRBC: 0 % (ref 0.0–0.2)
PLATELETS: 151 10*3/uL (ref 150–400)
RBC: 3.81 MIL/uL — ABNORMAL LOW (ref 3.87–5.11)
RDW: 15 % (ref 11.5–15.5)
WBC: 14.4 10*3/uL — ABNORMAL HIGH (ref 4.0–10.5)

## 2018-06-07 LAB — LACTIC ACID, PLASMA
Lactic Acid, Venous: 2.1 mmol/L (ref 0.5–1.9)
Lactic Acid, Venous: 2.7 mmol/L (ref 0.5–1.9)

## 2018-06-07 LAB — AMMONIA: Ammonia: 45 umol/L — ABNORMAL HIGH (ref 9–35)

## 2018-06-07 LAB — HIV ANTIBODY (ROUTINE TESTING W REFLEX): HIV Screen 4th Generation wRfx: NONREACTIVE

## 2018-06-07 LAB — GLUCOSE, CAPILLARY: Glucose-Capillary: 138 mg/dL — ABNORMAL HIGH (ref 70–99)

## 2018-06-07 MED ORDER — LORAZEPAM 2 MG/ML IJ SOLN
1.0000 mg | Freq: Once | INTRAMUSCULAR | Status: AC
  Administered 2018-06-07: 1 mg via INTRAVENOUS
  Filled 2018-06-07: qty 1

## 2018-06-07 MED ORDER — SODIUM CHLORIDE 0.9 % IV BOLUS
500.0000 mL | Freq: Once | INTRAVENOUS | Status: AC
  Administered 2018-06-07: 500 mL via INTRAVENOUS

## 2018-06-07 MED ORDER — ORAL CARE MOUTH RINSE
15.0000 mL | Freq: Two times a day (BID) | OROMUCOSAL | Status: DC
  Administered 2018-06-07 – 2018-06-14 (×14): 15 mL via OROMUCOSAL

## 2018-06-07 MED ORDER — BOOST / RESOURCE BREEZE PO LIQD CUSTOM
1.0000 | Freq: Three times a day (TID) | ORAL | Status: DC
  Administered 2018-06-07 – 2018-06-12 (×14): 1 via ORAL

## 2018-06-07 NOTE — Progress Notes (Signed)
CRITICAL VALUE ALERT  Critical Value:  Na 164 . Lactic acid 2.6  Date & Time Notied:  06/06/18 2300  Provider Notified: Schorr, NP  Orders Received/Actions taken: No new orders

## 2018-06-07 NOTE — Progress Notes (Signed)
PROGRESS NOTE    Tara Hurst  IOE:[id number] DOB: 02-11-1961 DOA: 06/06/2018 PCP: [name], MD  Outpatient Specialists:   Brief Narrative:  Tara Hurst is a 57 y.o. female with medical history significant of hypertension, generalized anxiety disorder, schizoaffective disorder, history of benzodiazepine use who was found by by social worker who went to perform a wellness check.  She was found undressed on her bed/couch.  The last time she was known to be normal was about 2 weeks ago, when she was seen during a social work Tax adviser.  She is unable to provide further history at this time.  ED Course: Initial vital signs temperature 97.5 F, pulse 122, respirations 21, blood pressure 115/94 mmHg O2 sat 99% on room.  He received fluids, cefepime and vancomycin.  UDS was negative.  Urinalysis shows hazy appearance, rare bacteria and 30 mg/dL of proteinuria.  White count is 18.7, hemoglobin 14.7 g/dL and platelets 284.  Her hemoglobin level is usually around 11 g/dL.  Troponin was normal.  Lactic acid #1 was 4.97 and lactic acid #2 was 3.77 mmol/L.  Sodium 169, potassium 4.2, chloride 119 and CO2 25 mmol/L.  Bilirubin 2.0 g/dL, calcium 12.1, glucose 133, BUN 103 and creatinine 5.03 mg/dL.  Total protein was 9.9 and albumin 5.8 g/dL.  Transaminases are within normal limits.  Chest radiograph did not show any active cardiopulmonary disease.  CT head without contrast did not show any acute intracranial pathology.    06/07/2018: Patient seen.  No history available from the patient.  Latest renal panel reveals sodium of 156, potassium of 3.4, chloride of 121, BUN of 73 with serum creatinine of 3.62.  Albumin is 3.8.  Lactic acid is 2.7.  Latest CBC reveals WBC of 14.4, hemoglobin of 9.7, hematocrit of 33.4, MCV of 87.7, blood count of 151.  Will check urine sodium, urine osmolality and plasma osmolality.  We will continue to monitor and correct abnormal electrolytes.  Will adjust IV fluid  as deemed necessary.  No fever documented.  WBC is on the downward trend.  Nasal swab for MRSA came back negative.  Will check procalcitonin.  Will review patient's antibiotics.  Further management will depend on hospital course.  Long-term prognosis remains guarded.  Assessment & Plan:   Principal Problem:   Hypernatremia Active Problems:   Hypertension   Acute encephalopathy   Tachycardia   AKI (acute kidney injury) ([place])   Sepsis due to undetermined organism ([place])   Acute encephalopathy: Cause is unclear, but suspect metabolic. Cannot rule out toxic etiology for the encephalopathy. Continue neurochecks every 4 hours. Strict I's and O's.   -Monitor sodium level closely, and the rate of resolution, with goal of 0.5 mEq/L decreasing sodium every hour. -Ammonia level is 45. -Follow ABG level. -No documented fever -Leukocytosis is resolving.  WBC is down from 18-14. -Sodium level has gone from 169 to 156. -Decrease D5 water to 125 cc/h -Correct hypokalemia -Check procalcitonin -Repeat chest x-ray -Nasal swab for MRSA is negative.  Streamline antibiotics, if that will indicated. -Check urine sodium, urine osmolality and plasma osmolality. -Rule out DI as possible cause of her of hypernatremia -Check cortisol level -Further management will depend on hospital course.  Guarded long-term prognosis.   Hypernatremia  -Water deficit by calculation was at least greater than 7 L on presentation.  -Current water deficit by calculation is at least greater than 4 L -Sodium has gone from 169 to 156. -Decrease D5 water from 200 cc an hour  to 125 cc an hour -Monitor renal panel every 8 hours, and adjust IV fluids accordingly -Check urine sodium, urine and plasma osmolality. -As documented above, however low threshold to rule out possible DI. -Strict I's and O's. -Low threshold to consult nephrology -Reviewing patient's records, patient has had significant intermittent hyperntremia over  several years -Further management depend on hospital course.  AKI on CKD stage III: AKI is resolving. Continue to monitor closely. Low threshold to work-up further.  Possible SIRS/Sepsis: Presentation seems more like SIRS Continue to assess. Check procalcitonin Repeat chest x-ray Follow cultures Reassess need for antibiotics Cultures are negative to date Nasal swab for MRSA is negative Patient is currently on antibiotics for sepsis syndrome (will review need for antibiotics)  Hypertension Hold antihypertensives for now. Monitor blood pressure.  Tachycardia Continue IV hydration. Continue cardiac monitoring.   DVT prophylaxis: SQ heparin. Code Status: Full code. Family Communication:   Consultants:   None  Procedures:   None  Antimicrobials:   Cefepime  Vancomycin  Flagyl   Subjective: History from patient   Objective: Vitals:   06/06/18 1950 06/06/18 2018 06/06/18 2327 06/07/18 0551  BP:  (!) 130/98 97/63   Pulse: (!) 126 (!) 120 (!) 110   Resp: 13 (!) 25 (!) 21   Temp:  (!) 97.4 F (36.3 C) 97.6 F (36.4 C) 98 F (36.7 C)  TempSrc:  Oral Oral Oral  SpO2: 100% 100% 100% 100%  Weight:      Height:        Intake/Output Summary (Last 24 hours) at 06/07/2018 1257 Last data filed at 06/07/2018 1250 Gross per 24 hour  Intake 5944.87 ml  Output 1400 ml  Net 4544.87 ml   Filed Weights   06/06/18 1219 06/06/18 1700  Weight: 70.3 kg 70.3 kg    Examination:  General exam: Appears calm and comfortable. Respiratory system: Clear to auscultation.  Cardiovascular system: S1 & S2, tachycardic. No pedal edema. Gastrointestinal system: Abdomen is nondistended, soft and nontender. No organomegaly or masses felt. Normal bowel sounds heard. Central nervous system: Intermittently awake.  Follows command to limited extent.  Extremities: No leg edema.     Data Reviewed: I have personally reviewed following labs and imaging  studies  CBC: Recent Labs  Lab 06/06/18 1322 06/07/18 0805  WBC 18.7* 14.4*  HGB 14.7 9.7*  HCT 49.1* 33.4*  MCV 87.7 87.7  PLT 284 341   Basic Metabolic Panel: Recent Labs  Lab 06/06/18 1322 06/06/18 1841 06/06/18 2138 06/07/18 0048 06/07/18 0453  NA 169* 166* 164* 160* 156*  K 4.2 4.2 3.7 3.3* 3.3*  CL 119* 130* 126* 123* 121*  CO2 25 22 24 26 23   GLUCOSE 133* 188* 101* 153* 164*  BUN 103* 97* 90* 84* 78*  CREATININE 5.03* 4.42* 4.00* 3.74* 3.62*  CALCIUM 12.1* 9.8 9.9 9.7 9.9  MG 3.9*  --   --   --   --    GFR: Estimated Creatinine Clearance: 16.9 mL/min (A) (by C-G formula based on SCr of 3.62 mg/dL (H)). Liver Function Tests: Recent Labs  Lab 06/06/18 1322 06/07/18 0453  AST 23 20  ALT 19 13  ALKPHOS 97 63  BILITOT 2.0* 1.3*  PROT 9.9* 6.1*  ALBUMIN 5.8* 3.8   No results for input(s): LIPASE, AMYLASE in the last 168 hours. No results for input(s): AMMONIA in the last 168 hours. Coagulation Profile: No results for input(s): INR, PROTIME in the last 168 hours. Cardiac Enzymes: Recent Labs  Lab 06/06/18  2138  CKTOTAL 76   BNP (last 3 results) No results for input(s): PROBNP in the last 8760 hours. HbA1C: No results for input(s): HGBA1C in the last 72 hours. CBG: Recent Labs  Lab 06/06/18 1227  GLUCAP 126*   Lipid Profile: No results for input(s): CHOL, HDL, LDLCALC, TRIG, CHOLHDL, LDLDIRECT in the last 72 hours. Thyroid Function Tests: No results for input(s): TSH, T4TOTAL, FREET4, T3FREE, THYROIDAB in the last 72 hours. Anemia Panel: No results for input(s): VITAMINB12, FOLATE, FERRITIN, TIBC, IRON, RETICCTPCT in the last 72 hours. Urine analysis:    Component Value Date/Time   COLORURINE YELLOW 06/06/2018 1542   APPEARANCEUR HAZY (A) 06/06/2018 1542   LABSPEC 1.014 06/06/2018 1542   PHURINE 5.0 06/06/2018 1542   GLUCOSEU NEGATIVE 06/06/2018 1542   HGBUR NEGATIVE 06/06/2018 1542   BILIRUBINUR NEGATIVE 06/06/2018 1542   KETONESUR  NEGATIVE 06/06/2018 1542   PROTEINUR 30 (A) 06/06/2018 1542   UROBILINOGEN 0.2 09/25/2013 1322   NITRITE NEGATIVE 06/06/2018 1542   LEUKOCYTESUR NEGATIVE 06/06/2018 1542   Sepsis Labs: @LABRCNTIP (procalcitonin:4,lacticidven:4)  ) Recent Results (from the past 240 hour(s))  Blood Culture (routine x 2)     Status: None (Preliminary result)   Collection Time: 06/06/18  2:53 PM  Result Value Ref Range Status   Specimen Description SITE NOT SPECIFIED  Final   Special Requests   Final    BOTTLES DRAWN AEROBIC ONLY Blood Culture results may not be optimal due to an inadequate volume of blood received in culture bottles   Culture   Final    NO GROWTH < 24 HOURS Performed at [facility], [place] [address] [place], [place] [address]    Report Status PENDING  Incomplete  Blood Culture (routine x 2)     Status: None (Preliminary result)   Collection Time: 06/06/18  3:10 PM  Result Value Ref Range Status   Specimen Description BLOOD RIGHT ARM UPPER  Final   Special Requests   Final    BOTTLES DRAWN AEROBIC AND ANAEROBIC Blood Culture adequate volume   Culture   Final    NO GROWTH < 24 HOURS Performed at [facility], [place] [address], [place], [place] [address]    Report Status PENDING  Incomplete  MRSA PCR Screening     Status: None   Collection Time: 06/06/18  6:01 PM  Result Value Ref Range Status   MRSA by PCR NEGATIVE NEGATIVE Final    Comment:        The GeneXpert MRSA Assay (FDA approved for NASAL specimens only), is one component of a comprehensive MRSA colonization surveillance program. It is not intended to diagnose MRSA infection nor to guide or monitor treatment for MRSA infections. Performed at [facility], [place] [address], [place], [place] [address]          Radiology Studies: Dg Chest 2 View  Result Date: 06/06/2018 CLINICAL DATA:  Altered mental status. EXAM: CHEST - 2 VIEW COMPARISON:  CT chest and chest x-ray dated October 18, 2017.  FINDINGS: The heart size and mediastinal contours are within normal limits. Normal pulmonary vascularity. No focal consolidation, pleural effusion, or pneumothorax. No acute osseous abnormality. Foot IMPRESSION: No active cardiopulmonary disease. Electronically Signed   By: Titus Dubin M.D.   On: 06/06/2018 13:04   Ct Head Wo Contrast  Result Date: 06/06/2018 CLINICAL DATA:  Altered mental status EXAM: CT HEAD WITHOUT CONTRAST TECHNIQUE: Contiguous axial images were obtained from the base of the skull  through the vertex without intravenous contrast. COMPARISON:  12/03/2017 FINDINGS: Brain: No evidence of acute infarction, hemorrhage, hydrocephalus, extra-axial collection or mass lesion/mass effect. Vascular: No hyperdense vessel or unexpected calcification. Skull: Normal. Negative for fracture or focal lesion. Sinuses/Orbits: No acute finding. Other: None. IMPRESSION: No acute intracranial abnormality noted. Electronically Signed   By: Inez Catalina M.D.   On: 06/06/2018 13:37        Scheduled Meds: . heparin  5,000 Units Subcutaneous Q8H  . mouth rinse  15 mL Mouth Rinse BID  . vancomycin variable dose per unstable renal function (pharmacist dosing)   Does not apply See admin instructions   Continuous Infusions: . sodium chloride Stopped (06/06/18 1948)  . sodium chloride 500 mL (06/07/18 0950)  . ceFEPime (MAXIPIME) IV Stopped (06/06/18 1815)  . dextrose 200 mL/hr at 06/07/18 1250  . metronidazole 500 mg (06/07/18 0541)  . sodium chloride       LOS: 1 day    Time spent: Greater than 35 minutes.   Dana Allan, MD  [facility] Pager #: [phone] 7PM-7AM contact night coverage as above

## 2018-06-07 NOTE — Progress Notes (Signed)
Notified Dr. Marcie Bal that Lactate was 2.7 today.

## 2018-06-07 NOTE — Progress Notes (Signed)
Patient confused. Patient was so restless in bed that she was found with IV tubing and PC monitor wires wrapped around her body twice. Patient not trying to get out of bed and bed alarm not going off. Patient repositioned in bed and wires and tubing adjusted. Patient was then resting still in bed, but this intermittent restlessness has been occurring all shift. Soft mitts applied to hands. Schorr, NP notified. Ativan 1mg  given IV per one time order. Patient resting in bed at this time, no distress noted. Will continue to monitor.

## 2018-06-07 NOTE — Progress Notes (Addendum)
Initial Nutrition Assessment  DOCUMENTATION CODES:   Not applicable  INTERVENTION:    Boost Breeze po TID, each supplement provides 250 kcal and 9 grams of protein  NUTRITION DIAGNOSIS:   Increased nutrient needs related to acute illness as evidenced by estimated needs  GOAL:   Patient will meet greater than or equal to 90% of their needs  MONITOR:   PO intake, Supplement acceptance, Labs, Weight trends, Skin  REASON FOR ASSESSMENT:   Malnutrition Screening Tool  ASSESSMENT:   57 yo [place] with h/o schizoaffective disorder, anxiety, HTN, chronic benzodiazapine use, brought to ER via EMS for AMS.  History obtained from EMS and some from patient.  Per EMS social worker visited patient for wellness check, she was found undressed on bed/couch.    Admit Dx: Hypernatremia [E87.0] Acute kidney injury ([place]) [N17.9]  Pt sleeping at time of visit; safety mittens on. Has safety mittens on; not responsive to name call x 2. Currently on Clear Liquids; PO intake 100% per flowsheets.  Per PA-C note in ED, pt hasn't been eating much for several days. Nutrition screen revealed pt also with unintentional weight loss. Labs & medications reviewed. CBG 126.  NUTRITION - FOCUSED PHYSICAL EXAM:  Unable to complete at this time, however, pt appeared well nourished.  Diet Order:   Diet Order            Diet clear liquid Room service appropriate? Yes; Fluid consistency: Thin  Diet effective now             EDUCATION NEEDS:   Not appropriate for education at this time  Skin:  Skin Assessment: Reviewed RN Assessment  Last BM:  N/A  Height:   Ht Readings from Last 1 Encounters:  06/06/18 5\' 5"  (1.651 m)   Weight:   Wt Readings from Last 1 Encounters:  06/06/18 70.3 kg   Wt Readings from Last 10 Encounters:  06/06/18 70.3 kg  12/11/17 70.3 kg  12/10/17 69.9 kg  12/04/17 68.7 kg  05/10/17 72.2 kg  09/25/13 69.5 kg   BMI:  Body mass index is 25.79 kg/m.  Estimated  Nutritional Needs:   Kcal:  4098-1191  Protein:  80-95 gm  Fluid:  1.7-1.9 L  Arthur Holms, RD, LDN Pager #: [phone] After-Hours Pager #: [phone]

## 2018-06-07 NOTE — Progress Notes (Signed)
Patient's Na noted to be 166. Previous Na was 169, so admitting MD already aware. Will continue to monitor.

## 2018-06-07 NOTE — Progress Notes (Signed)
CRITICAL VALUE ALERT  Critical Value:  Lactic acid 2.1   Date & Time Notied:  06/07/18 9324  Provider Notified: Hilbert Bible, NP  Orders Received/Actions taken: Awaiting response.

## 2018-06-08 ENCOUNTER — Inpatient Hospital Stay (HOSPITAL_COMMUNITY): Payer: Medicaid Other

## 2018-06-08 LAB — RENAL FUNCTION PANEL
Albumin: 3.2 g/dL — ABNORMAL LOW (ref 3.5–5.0)
Albumin: 3.1 g/dL — ABNORMAL LOW (ref 3.5–5.0)
Albumin: 3 g/dL — ABNORMAL LOW (ref 3.5–5.0)
Anion gap: 9 (ref 5–15)
Anion gap: 7 (ref 5–15)
Anion gap: 6 (ref 5–15)
BUN: 44 mg/dL — ABNORMAL HIGH (ref 6–20)
BUN: 35 mg/dL — ABNORMAL HIGH (ref 6–20)
BUN: 31 mg/dL — ABNORMAL HIGH (ref 6–20)
CO2: 23 mmol/L (ref 22–32)
CO2: 22 mmol/L (ref 22–32)
CO2: 23 mmol/L (ref 22–32)
Calcium: 9.4 mg/dL (ref 8.9–10.3)
Calcium: 9.3 mg/dL (ref 8.9–10.3)
Calcium: 9.4 mg/dL (ref 8.9–10.3)
Chloride: 119 mmol/L — ABNORMAL HIGH (ref 98–111)
Chloride: 124 mmol/L — ABNORMAL HIGH (ref 98–111)
Chloride: 124 mmol/L — ABNORMAL HIGH (ref 98–111)
Creatinine, Ser: 2.65 mg/dL — ABNORMAL HIGH (ref 0.44–1.00)
Creatinine, Ser: 2.54 mg/dL — ABNORMAL HIGH (ref 0.44–1.00)
Creatinine, Ser: 2.39 mg/dL — ABNORMAL HIGH (ref 0.44–1.00)
GFR calc Af Amer: 22 mL/min — ABNORMAL LOW (ref >60)
GFR calc Af Amer: 23 mL/min — ABNORMAL LOW (ref >60)
GFR calc Af Amer: 25 mL/min — ABNORMAL LOW (ref >60)
GFR calc non Af Amer: 19 mL/min — ABNORMAL LOW (ref >60)
GFR calc non Af Amer: 20 mL/min — ABNORMAL LOW (ref >60)
GFR calc non Af Amer: 22 mL/min — ABNORMAL LOW (ref >60)
Glucose, Bld: 128 mg/dL — ABNORMAL HIGH (ref 70–99)
Glucose, Bld: 104 mg/dL — ABNORMAL HIGH (ref 70–99)
Glucose, Bld: 140 mg/dL — ABNORMAL HIGH (ref 70–99)
Phosphorus: 2.6 mg/dL (ref 2.5–4.6)
Phosphorus: 2.5 mg/dL (ref 2.5–4.6)
Phosphorus: 2.4 mg/dL — ABNORMAL LOW (ref 2.5–4.6)
Potassium: 2.8 mmol/L — ABNORMAL LOW (ref 3.5–5.1)
Potassium: 2.9 mmol/L — ABNORMAL LOW (ref 3.5–5.1)
Potassium: 2.7 mmol/L — CL (ref 3.5–5.1)
Sodium: 151 mmol/L — ABNORMAL HIGH (ref 135–145)
Sodium: 153 mmol/L — ABNORMAL HIGH (ref 135–145)
Sodium: 153 mmol/L — ABNORMAL HIGH (ref 135–145)

## 2018-06-08 LAB — CBC WITH DIFFERENTIAL/PLATELET
Abs Immature Granulocytes: 0.04 10*3/uL (ref 0.00–0.07)
Basophils Absolute: 0 10*3/uL (ref 0.0–0.1)
Basophils Relative: 0 %
Eosinophils Absolute: 0.2 10*3/uL (ref 0.0–0.5)
Eosinophils Relative: 3 %
HCT: 29.7 % — ABNORMAL LOW (ref 36.0–46.0)
Hemoglobin: 9 g/dL — ABNORMAL LOW (ref 12.0–15.0)
Immature Granulocytes: 1 %
Lymphocytes Relative: 17 %
Lymphs Abs: 1.4 10*3/uL (ref 0.7–4.0)
MCH: 26 pg (ref 26.0–34.0)
MCHC: 30.3 g/dL (ref 30.0–36.0)
MCV: 85.8 fL (ref 80.0–100.0)
Monocytes Absolute: 0.7 10*3/uL (ref 0.1–1.0)
Monocytes Relative: 9 %
Neutro Abs: 6.1 10*3/uL (ref 1.7–7.7)
Neutrophils Relative %: 70 %
Platelets: 119 10*3/uL — ABNORMAL LOW (ref 150–400)
RBC: 3.46 MIL/uL — ABNORMAL LOW (ref 3.87–5.11)
RDW: 14.8 % (ref 11.5–15.5)
WBC: 8.6 10*3/uL (ref 4.0–10.5)
nRBC: 0 % (ref 0.0–0.2)

## 2018-06-08 LAB — URINALYSIS, ROUTINE W REFLEX MICROSCOPIC
Bilirubin Urine: NEGATIVE
Glucose, UA: NEGATIVE mg/dL
Ketones, ur: NEGATIVE mg/dL
Nitrite: NEGATIVE
Protein, ur: NEGATIVE mg/dL
Specific Gravity, Urine: 1.003 — ABNORMAL LOW (ref 1.005–1.030)
pH: 6 (ref 5.0–8.0)

## 2018-06-08 LAB — OSMOLALITY, URINE: Osmolality, Ur: 149 mOsm/kg — ABNORMAL LOW (ref 300–900)

## 2018-06-08 LAB — OSMOLALITY: Osmolality: 325 mOsm/kg (ref 275–295)

## 2018-06-08 LAB — MAGNESIUM: Magnesium: 2.2 mg/dL (ref 1.7–2.4)

## 2018-06-08 LAB — CK: Total CK: 54 U/L (ref 38–234)

## 2018-06-08 LAB — APTT: aPTT: 59 seconds — ABNORMAL HIGH (ref 24–36)

## 2018-06-08 LAB — SODIUM, URINE, RANDOM: Sodium, Ur: 30 mmol/L

## 2018-06-08 LAB — CORTISOL: Cortisol, Plasma: 14 ug/dL

## 2018-06-08 LAB — PROCALCITONIN: Procalcitonin: 0.1 ng/mL

## 2018-06-08 MED ORDER — POTASSIUM CHLORIDE 10 MEQ/100ML IV SOLN
10.0000 meq | INTRAVENOUS | Status: AC
  Administered 2018-06-08 (×2): 10 meq via INTRAVENOUS
  Filled 2018-06-08 (×2): qty 100

## 2018-06-08 MED ORDER — SODIUM CHLORIDE 0.9% FLUSH: 3.0000 mL | INTRAVENOUS | Status: DC | PRN

## 2018-06-08 MED ORDER — DEXTROSE 5 % IV SOLN
INTRAVENOUS | Status: DC
  Administered 2018-06-08 – 2018-06-09 (×7): via INTRAVENOUS
  Administered 2018-06-09: 125 mL/h via INTRAVENOUS
  Administered 2018-06-10 – 2018-06-11 (×3): via INTRAVENOUS

## 2018-06-08 MED ORDER — VANCOMYCIN HCL IN DEXTROSE 1-5 GM/200ML-% IV SOLN
1000.0000 mg | INTRAVENOUS | Status: DC
  Administered 2018-06-08: 1000 mg via INTRAVENOUS
  Filled 2018-06-08: qty 200

## 2018-06-08 MED ORDER — SODIUM CHLORIDE 0.9 % IV SOLN: 250.0000 mL | INTRAVENOUS | Status: DC | PRN

## 2018-06-08 MED ORDER — ARGATROBAN 50 MG/50ML IV SOLN
1.0000 ug/kg/min | INTRAVENOUS | Status: DC
  Administered 2018-06-08 – 2018-06-11 (×6): 1 ug/kg/min via INTRAVENOUS
  Filled 2018-06-08 (×6): qty 50

## 2018-06-08 MED ORDER — POTASSIUM CHLORIDE CRYS ER 20 MEQ PO TBCR
40.0000 meq | EXTENDED_RELEASE_TABLET | Freq: Once | ORAL | Status: AC
  Administered 2018-06-08: 40 meq via ORAL
  Filled 2018-06-08: qty 2

## 2018-06-08 MED ORDER — SODIUM CHLORIDE 0.9% FLUSH
3.0000 mL | Freq: Two times a day (BID) | INTRAVENOUS | Status: DC
  Administered 2018-06-08 – 2018-06-09 (×2): 3 mL via INTRAVENOUS

## 2018-06-08 NOTE — Progress Notes (Signed)
MD notify of critical  lab value of K 2.7 at 5:21 pm.

## 2018-06-08 NOTE — Progress Notes (Signed)
PROGRESS NOTE    Tara Hurst  XBD:[id number] DOB: 1960-11-17 DOA: 06/06/2018 PCP: [name], MD  Outpatient Specialists:   Brief Narrative:  Tara Hurst is a 57 y.o. female with medical history significant of hypertension, generalized anxiety disorder, schizoaffective disorder, history of benzodiazepine use who was found by by social worker who went to perform a wellness check.  She was found undressed on her bed/couch.  The last time she was known to be normal was about 2 weeks ago, when she was seen during a social work Tax adviser.  She is unable to provide further history at this time.  ED Course: Initial vital signs temperature 97.5 F, pulse 122, respirations 21, blood pressure 115/94 mmHg O2 sat 99% on room.  He received fluids, cefepime and vancomycin.  UDS was negative.  Urinalysis shows hazy appearance, rare bacteria and 30 mg/dL of proteinuria.  White count is 18.7, hemoglobin 14.7 g/dL and platelets 284.  Her hemoglobin level is usually around 11 g/dL.  Troponin was normal.  Lactic acid #1 was 4.97 and lactic acid #2 was 3.77 mmol/L.  Sodium 169, potassium 4.2, chloride 119 and CO2 25 mmol/L.  Bilirubin 2.0 g/dL, calcium 12.1, glucose 133, BUN 103 and creatinine 5.03 mg/dL.  Total protein was 9.9 and albumin 5.8 g/dL.  Transaminases are within normal limits.  Chest radiograph did not show any active cardiopulmonary disease.  CT head without contrast did not show any acute intracranial pathology.    06/07/2018: Patient seen.  No history available from the patient.  Latest renal panel reveals sodium of 156, potassium of 3.4, chloride of 121, BUN of 73 with serum creatinine of 3.62.  Albumin is 3.8.  Lactic acid is 2.7.  Latest CBC reveals WBC of 14.4, hemoglobin of 9.7, hematocrit of 33.4, MCV of 87.7, blood count of 151.  Will check urine sodium, urine osmolality and plasma osmolality.  We will continue to monitor and correct abnormal electrolytes.  Will adjust IV fluid  as deemed necessary.  No fever documented.  WBC is on the downward trend.  Nasal swab for MRSA came back negative.  Will check procalcitonin.  Will review patient's antibiotics.  Further management will depend on hospital course.  Long-term prognosis remains guarded.  06/08/2018: Patient looks much better today.  Patient is awake and alert.  However, patient cannot tell me events leading to the hospital admission.  Patient moves all limbs.  Sodium is down to 153.  Serum creatinine is down to 2.39.  Potassium is 2.7.  We will continue to replete.  Will repeat urine osmolality and plasma osmolality if hypernatremia persists after the AKI has resolved.  Assessment & Plan:   Principal Problem:   Hypernatremia Active Problems:   Hypertension   Acute encephalopathy   Tachycardia   AKI (acute kidney injury) ([place])   Sepsis due to undetermined organism ([place])   Acute encephalopathy: Cause is unclear, but suspect metabolic. Cannot rule out toxic etiology for the encephalopathy. Continue neurochecks every 4 hours. Strict I's and O's.   -Monitor sodium level closely, and the rate of resolution, with goal of 0.5 mEq/L decreasing sodium every hour. -Ammonia level is 45. -Follow ABG level. -No documented fever -Leukocytosis is resolving.  WBC is down from 18-14. -Sodium level has gone from 169 to 156. -Decrease D5 water to 125 cc/h -Correct hypokalemia -Check procalcitonin -Repeat chest x-ray -Nasal swab for MRSA is negative.  Streamline antibiotics, if that will indicated. -Check urine sodium, urine osmolality and plasma osmolality. -  Rule out DI as possible cause of her of hypernatremia -Check cortisol level -Further management will depend on hospital course. 06/08/2018: Acute encephalopathy has resolved significantly.  Continue current management.  Guarded long-term prognosis.   Hypernatremia  -Water deficit by calculation was at least greater than 7 L on presentation.  -Current water  deficit by calculation is at least greater than 4 L -Sodium has gone from 169 to 156. -Decrease D5 water from 200 cc an hour to 125 cc an hour -Monitor renal panel every 8 hours, and adjust IV fluids accordingly -Check urine sodium, urine and plasma osmolality. -As documented above, however low threshold to rule out possible DI. -Strict I's and O's. -Low threshold to consult nephrology -Reviewing patient's records, patient has had significant intermittent hyperntremia over several years -Further management depend on hospital course. 06/08/2018: Hypernatremia is improving.  Remains down to 153.  AKI on CKD stage III: AKI is resolving. Continue to monitor closely. Low threshold to work-up further.  Possible SIRS/Sepsis: Presentation seems more like SIRS Continue to assess. Check procalcitonin Repeat chest x-ray Follow cultures Reassess need for antibiotics Cultures are negative to date Nasal swab for MRSA is negative Patient is currently on antibiotics for sepsis syndrome (will review need for antibiotics)  Hypertension Hold antihypertensives for now. Monitor blood pressure.  Tachycardia Continue IV hydration. Continue cardiac monitoring.   DVT prophylaxis: SQ heparin. Code Status: Full code. Family Communication:   Consultants:   None  Procedures:   None  Antimicrobials:   Cefepime  Vancomycin  Flagyl   Subjective: No complaints. Poor historian. No fever or chills.  Objective: Vitals:   06/08/18 0056 06/08/18 0309 06/08/18 1037 06/08/18 1636  BP: (!) 81/67  110/62 (!) 87/51  Pulse:   74 76  Resp: 20  13 17   Temp:    98.4 F (36.9 C)  TempSrc:    Oral  SpO2:   98% 98%  Weight:  59.7 kg    Height:        Intake/Output Summary (Last 24 hours) at 06/08/2018 1742 Last data filed at 06/08/2018 1639 Gross per 24 hour  Intake 3995.98 ml  Output 4080 ml  Net -84.02 ml   Filed Weights   06/06/18 1219 06/06/18 1700 06/08/18 0309  Weight:  70.3 kg 70.3 kg 59.7 kg    Examination:  General exam: Appears calm and comfortable. Respiratory system: Clear to auscultation.  Cardiovascular system: S1 & S2, tachycardic. No pedal edema. Gastrointestinal system: Abdomen is nondistended, soft and nontender. No organomegaly or masses felt. Normal bowel sounds heard. Central nervous system: Follows command.  Patient moves all limbs. Extremities: No leg edema.     Data Reviewed: I have personally reviewed following labs and imaging studies  CBC: Recent Labs  Lab 06/06/18 1322 06/07/18 0805 06/08/18 0230  WBC 18.7* 14.4* 8.6  NEUTROABS  --   --  6.1  HGB 14.7 9.7* 9.0*  HCT 49.1* 33.4* 29.7*  MCV 87.7 87.7 85.8  PLT 284 151 073*   Basic Metabolic Panel: Recent Labs  Lab 06/06/18 1322  06/07/18 0048 06/07/18 0453 06/08/18 0230 06/08/18 0958 06/08/18 1636  NA 169*   < > 160* 156* 151* 153* 153*  K 4.2   < > 3.3* 3.3* 2.8* 2.9* 2.7*  CL 119*   < > 123* 121* 119* 124* 124*  CO2 25   < > 26 23 23 22 23   GLUCOSE 133*   < > 153* 164* 128* 104* 140*  BUN 103*   < >  84* 78* 44* 35* 31*  CREATININE 5.03*   < > 3.74* 3.62* 2.65* 2.54* 2.39*  CALCIUM 12.1*   < > 9.7 9.9 9.4 9.3 9.4  MG 3.9*  --   --   --  2.2  --   --   PHOS  --   --   --   --  2.6 2.5 2.4*   < > = values in this interval not displayed.   GFR: Estimated Creatinine Clearance: 23.4 mL/min (A) (by C-G formula based on SCr of 2.39 mg/dL (H)). Liver Function Tests: Recent Labs  Lab 06/06/18 1322 06/07/18 0453 06/08/18 0230 06/08/18 0958 06/08/18 1636  AST 23 20  --   --   --   ALT 19 13  --   --   --   ALKPHOS 97 63  --   --   --   BILITOT 2.0* 1.3*  --   --   --   PROT 9.9* 6.1*  --   --   --   ALBUMIN 5.8* 3.8 3.2* 3.1* 3.0*   No results for input(s): LIPASE, AMYLASE in the last 168 hours. Recent Labs  Lab 06/07/18 1759  AMMONIA 45*   Coagulation Profile: No results for input(s): INR, PROTIME in the last 168 hours. Cardiac Enzymes: Recent  Labs  Lab 06/06/18 2138 06/08/18 0230  CKTOTAL 76 54   BNP (last 3 results) No results for input(s): PROBNP in the last 8760 hours. HbA1C: No results for input(s): HGBA1C in the last 72 hours. CBG: Recent Labs  Lab 06/06/18 1227 06/07/18 1753  GLUCAP 126* 138*   Lipid Profile: No results for input(s): CHOL, HDL, LDLCALC, TRIG, CHOLHDL, LDLDIRECT in the last 72 hours. Thyroid Function Tests: No results for input(s): TSH, T4TOTAL, FREET4, T3FREE, THYROIDAB in the last 72 hours. Anemia Panel: No results for input(s): VITAMINB12, FOLATE, FERRITIN, TIBC, IRON, RETICCTPCT in the last 72 hours. Urine analysis:    Component Value Date/Time   COLORURINE STRAW (A) 06/08/2018 0322   APPEARANCEUR CLEAR 06/08/2018 0322   LABSPEC 1.003 (L) 06/08/2018 0322   PHURINE 6.0 06/08/2018 0322   GLUCOSEU NEGATIVE 06/08/2018 0322   HGBUR MODERATE (A) 06/08/2018 0322   BILIRUBINUR NEGATIVE 06/08/2018 0322   KETONESUR NEGATIVE 06/08/2018 0322   PROTEINUR NEGATIVE 06/08/2018 0322   UROBILINOGEN 0.2 09/25/2013 1322   NITRITE NEGATIVE 06/08/2018 0322   LEUKOCYTESUR TRACE (A) 06/08/2018 0322   Sepsis Labs: @LABRCNTIP (procalcitonin:4,lacticidven:4)  ) Recent Results (from the past 240 hour(s))  Blood Culture (routine x 2)     Status: None (Preliminary result)   Collection Time: 06/06/18  2:53 PM  Result Value Ref Range Status   Specimen Description SITE NOT SPECIFIED  Final   Special Requests   Final    BOTTLES DRAWN AEROBIC ONLY Blood Culture results may not be optimal due to an inadequate volume of blood received in culture bottles   Culture   Final    NO GROWTH 2 DAYS Performed at [facility], [place] [address], [place], [place] [address]    Report Status PENDING  Incomplete  Blood Culture (routine x 2)     Status: None (Preliminary result)   Collection Time: 06/06/18  3:10 PM  Result Value Ref Range Status   Specimen Description BLOOD RIGHT ARM UPPER  Final   Special Requests    Final    BOTTLES DRAWN AEROBIC AND ANAEROBIC Blood Culture adequate volume   Culture   Final    NO GROWTH 2  DAYS Performed at [facility], [place] [address], [place], [place] [address]    Report Status PENDING  Incomplete  Urine culture     Status: None   Collection Time: 06/06/18  3:42 PM  Result Value Ref Range Status   Specimen Description URINE, CATHETERIZED  Final   Special Requests NONE  Final   Culture   Final    NO GROWTH Performed at [facility], [address] [place], [place] [address]    Report Status 06/07/2018 FINAL  Final  MRSA PCR Screening     Status: None   Collection Time: 06/06/18  6:01 PM  Result Value Ref Range Status   MRSA by PCR NEGATIVE NEGATIVE Final    Comment:        The GeneXpert MRSA Assay (FDA approved for NASAL specimens only), is one component of a comprehensive MRSA colonization surveillance program. It is not intended to diagnose MRSA infection nor to guide or monitor treatment for MRSA infections. Performed at [facility], [place] [address] [place],  [address]          Radiology Studies: Dg Chest Port 1 View  Result Date: 06/08/2018 CLINICAL DATA:  Leukocytosis. EXAM: PORTABLE CHEST 1 VIEW COMPARISON:  Radiographs of June 06, 2018. FINDINGS: The heart size and mediastinal contours are within normal limits. Both lungs are clear. The visualized skeletal structures are unremarkable. IMPRESSION: No active disease. Electronically Signed   By: Marijo Conception, M.D.   On: 06/08/2018 09:09        Scheduled Meds: . feeding supplement  1 Container Oral TID BM  . mouth rinse  15 mL Mouth Rinse BID  . potassium chloride  40 mEq Oral Once  . sodium chloride flush  3 mL Intravenous Q12H  . vancomycin variable dose per unstable renal function (pharmacist dosing)   Does not apply See admin instructions   Continuous Infusions: . sodium chloride Stopped (06/06/18 1948)  . sodium chloride 500 mL (06/07/18 0950)    . sodium chloride    . argatroban 1 mcg/kg/min (06/08/18 1254)  . ceFEPime (MAXIPIME) IV 1 g (06/08/18 1624)  . dextrose 125 mL/hr at 06/08/18 1624  . metronidazole 500 mg (06/08/18 1408)  . sodium chloride    . vancomycin       LOS: 2 days    Time spent: Greater than 35 minutes.   Dana Allan, MD  [facility] Pager #: [phone] 7PM-7AM contact night coverage as above

## 2018-06-08 NOTE — Progress Notes (Addendum)
ANTICOAGULATION CONSULT NOTE - Follow Up Consult  Pharmacy Consult for Argatroban  Indication: ? HIT  No Known Allergies  Patient Measurements: Height: 5\' 5"  (165.1 cm) Weight: 131 lb 9.8 oz (59.7 kg) IBW/kg (Calculated) : 57   Vital Signs: BP: 110/62 (12/14 1037) Pulse Rate: 74 (12/14 1037)  Labs: Recent Labs    06/06/18 1322  06/06/18 2138  06/07/18 0453 06/07/18 0805 06/08/18 0230 06/08/18 0958  HGB 14.7  --   --   --   --  9.7* 9.0*  --   HCT 49.1*  --   --   --   --  33.4* 29.7*  --   PLT 284  --   --   --   --  151 119*  --   CREATININE 5.03*   < > 4.00*   < > 3.62*  --  2.65* 2.54*  CKTOTAL  --   --  76  --   --   --  54  --    < > = values in this interval not displayed.    Estimated Creatinine Clearance: 22 mL/min (A) (by C-G formula based on SCr of 2.54 mg/dL (H)).  Assessment: 57 year old female admitted with acute encephalopathy who continues on antibiotics for possible sepsis.  Previously on sq heparin with a drop in platelet count from 284 to 119.  T score with intermediate probability for HIT.  HIT labs ordered and to begin Argatroban for treatment due to poor kidney function.  Goal of Therapy:  PTT goal of 50 to 90 seconds Monitor platelets by anticoagulation protocol: Yes   Plan:  Argatroban at 1 mcg/kg/min 2 hour PTT Follow up daily PTT, CBC Follow up HIT labs  Thank you Anette Guarneri, PharmD [phone]  PM: Initial PTT therapeutic at 59 seconds Next level in AM  Thank you Anette Guarneri, PharmD  Tad Moore 06/08/2018,12:11 PM

## 2018-06-09 LAB — RENAL FUNCTION PANEL
Albumin: 3 g/dL — ABNORMAL LOW (ref 3.5–5.0)
Albumin: 2.9 g/dL — ABNORMAL LOW (ref 3.5–5.0)
Albumin: 2.8 g/dL — ABNORMAL LOW (ref 3.5–5.0)
Anion gap: 9 (ref 5–15)
Anion gap: 7 (ref 5–15)
Anion gap: 8 (ref 5–15)
BUN: 23 mg/dL — ABNORMAL HIGH (ref 6–20)
BUN: 21 mg/dL — ABNORMAL HIGH (ref 6–20)
BUN: 17 mg/dL (ref 6–20)
CO2: 23 mmol/L (ref 22–32)
CO2: 23 mmol/L (ref 22–32)
CO2: 21 mmol/L — ABNORMAL LOW (ref 22–32)
Calcium: 9.2 mg/dL (ref 8.9–10.3)
Calcium: 9.3 mg/dL (ref 8.9–10.3)
Calcium: 8.8 mg/dL — ABNORMAL LOW (ref 8.9–10.3)
Chloride: 120 mmol/L — ABNORMAL HIGH (ref 98–111)
Chloride: 121 mmol/L — ABNORMAL HIGH (ref 98–111)
Chloride: 119 mmol/L — ABNORMAL HIGH (ref 98–111)
Creatinine, Ser: 2.15 mg/dL — ABNORMAL HIGH (ref 0.44–1.00)
Creatinine, Ser: 1.99 mg/dL — ABNORMAL HIGH (ref 0.44–1.00)
Creatinine, Ser: 1.9 mg/dL — ABNORMAL HIGH (ref 0.44–1.00)
GFR calc Af Amer: 29 mL/min — ABNORMAL LOW (ref >60)
GFR calc Af Amer: 32 mL/min — ABNORMAL LOW (ref >60)
GFR calc Af Amer: 33 mL/min — ABNORMAL LOW (ref >60)
GFR calc non Af Amer: 25 mL/min — ABNORMAL LOW (ref >60)
GFR calc non Af Amer: 27 mL/min — ABNORMAL LOW (ref >60)
GFR calc non Af Amer: 29 mL/min — ABNORMAL LOW (ref >60)
Glucose, Bld: 124 mg/dL — ABNORMAL HIGH (ref 70–99)
Glucose, Bld: 122 mg/dL — ABNORMAL HIGH (ref 70–99)
Glucose, Bld: 111 mg/dL — ABNORMAL HIGH (ref 70–99)
Phosphorus: 2.3 mg/dL — ABNORMAL LOW (ref 2.5–4.6)
Phosphorus: 2 mg/dL — ABNORMAL LOW (ref 2.5–4.6)
Phosphorus: 1.5 mg/dL — ABNORMAL LOW (ref 2.5–4.6)
Potassium: 2.9 mmol/L — ABNORMAL LOW (ref 3.5–5.1)
Potassium: 2.8 mmol/L — ABNORMAL LOW (ref 3.5–5.1)
Potassium: 4.1 mmol/L (ref 3.5–5.1)
Sodium: 152 mmol/L — ABNORMAL HIGH (ref 135–145)
Sodium: 151 mmol/L — ABNORMAL HIGH (ref 135–145)
Sodium: 148 mmol/L — ABNORMAL HIGH (ref 135–145)

## 2018-06-09 LAB — APTT: aPTT: 57 seconds — ABNORMAL HIGH (ref 24–36)

## 2018-06-09 MED ORDER — POTASSIUM CHLORIDE CRYS ER 20 MEQ PO TBCR
40.0000 meq | EXTENDED_RELEASE_TABLET | ORAL | Status: AC
  Administered 2018-06-09 (×2): 40 meq via ORAL
  Filled 2018-06-09 (×2): qty 2

## 2018-06-09 NOTE — Progress Notes (Signed)
PROGRESS NOTE    Tara Hurst  URK:[id number] DOB: 1961/03/27 DOA: 06/06/2018 PCP: [name], MD  Outpatient Specialists:   Brief Narrative: Patient is a 57 year old African-American female, with past medical history significant for hypertension, chronic kidney disease stage III, chronic anemia, chronic but intermittent hypernatremia, generalized anxiety disorder, schizoaffective disorder, history of benzodiazepine.  Patient may have been on several psychiatric medications in the past, and review of prior records indicate patient has had chronically, but mainly intermittently elevated sodium level for which exact etiology has not been documented as far as I can tell.  Patient was found by social worker who went to perform a wellness check.    Patient was found undressed on her bed/couch.  The last time patient was known to be normal was about 2 weeks prior to presentation, when she was seen during a social work wellness check.    Patient has been unable to provide history.  On presentation to the hospital, patient was unresponsive, afebrile, heart rate was 122 bpm blood pressure was 115/94 mmHg. Significantly, on presentation, white blood count was 18.7, hemoglobin 14.7 g/dL and platelets 284 (platelet count is now down to 97, and work-up for possible HIT is in process an patient's hemoglobin level is usually around 11 g/dL), troponin was normal, lactic acid was initially 4.97 but improved to 3.77, sodium 169, potassium 4.2, chloride 119 and CO2 25 mmol/L, BUN 103 and creatinine 5.03 mg/dL, total protein was 9.9 and albumin 5.8 g/dL, transaminases were within normal limits, chest radiograph did not show any active cardiopulmonary disease, CT head without contrast did not show any acute intracranial pathology.  Patient was admitted for further assessment and management.  Patient was pancultured, and no growth to date.  Patient was initially started on IV vancomycin, cefepime and Flagyl.   Antibiotics have been discontinued.  Kindly continue to follow culture results.  Procalcitonin is less than 0.1.  Patient has been afebrile.  Repeat chest x-ray has not revealed any significant findings.  With aggressive hydration, last sodium level is down to 148, BUN has improved to 17 and serum creatinine of 1.9 (patient's baseline serum creatinine ranges from 1.2 to 1.4).  Patient's mental status has improved significantly.  Patient is now awake and alert.  Will consider working patient up for possible diabetes insipidus when patient's renal function is determined to be back to baseline (patient may have new baseline serum creatinine after this episode).  Assessment & Plan:   Principal Problem:   Hypernatremia Active Problems:   Hypertension   Acute encephalopathy   Tachycardia   AKI (acute kidney injury) ([place])   Sepsis due to undetermined organism ([place])   Acute encephalopathy: Cause is unclear, but suspect metabolic. Cannot rule out toxic etiology for the encephalopathy. Encephalopathy has resolved significantly.  Patient is now awake and alert. Continue strict I's and O's.   -Monitor sodium level closely, and the rate of resolution, with goal of 0.5 mEq/L decreasing sodium every hour. -Ammonia level is 45. -No significantly elevated PCO2 noted (To account for the encephalopathy). -No documented fever -Leukocytosis has resolved (likely reactive). -Sodium level has gone from 169 to 148. -D5 water to 125 cc/h -Correct hypokalemia -Procalcitonin is 0.1.  -Repeat chest x-ray is non revealing -Nasal swab for MRSA is negative.  -Antibiotics (Vanc, Cefepime and Flagyl) discontinued.   -Urine urine sodium, urine osmolality and plasma osmolality result noted (Consider repeating when renal function is back to baseline.  Consider work up for possible Diabetes Insipidus when AKI  resolves) -Rule out DI as possible cause of her of hypernatremia -Cortisol level is within normal range -Further  management will depend on hospital course.  Guarded long-term prognosis.   Hypernatremia  -Water deficit by calculation was at least greater than 7 L on presentation.  -Sodium has gone from 169 to 148. -D5 water at 125 cc an hour -Monitor renal panel closely, and adjust IV fluids accordingly -Repeat urine sodium, urine and plasma osmolality when AKI resolves. -Strict I's and O's. -Low threshold to consult nephrology -Reviewing patient's records, patient has had significant intermittent hyperntremia over several years -Further management depend on hospital course.  AKI on CKD stage III: AKI is resolving. Continue to monitor closely. Low threshold to work-up further.  Possible SIRS/Sepsis: Presentation seems more like SIRS Continue to assess. See above please. Antibiotics discontinued Cultures are negative to date Nasal swab for MRSA is negative  Hypertension Hold antihypertensives for now. Monitor blood pressure.  Tachycardia Continue IV hydration. Continue cardiac monitoring.  DVT prophylaxis: SQ heparin. Code Status: Full code. Family Communication:   Consultants:   None  Procedures:   None  Antimicrobials:   Cefepime  Vancomycin  Flagyl   Subjective: No complaints. Poor historian. No fever or chills.  Objective: Vitals:   06/09/18 0844 06/09/18 1227 06/09/18 1533 06/09/18 1632  BP: 101/64 98/64 101/88 97/67  Pulse: 75  83   Resp:   16   Temp: 98.2 F (36.8 C) 98.8 F (37.1 C)  98.5 F (36.9 C)  TempSrc: Oral Oral  Oral  SpO2: 100% 100% 99%   Weight:      Height:        Intake/Output Summary (Last 24 hours) at 06/09/2018 1814 Last data filed at 06/09/2018 1500 Gross per 24 hour  Intake 3409.74 ml  Output 2600 ml  Net 809.74 ml   Filed Weights   06/06/18 1219 06/06/18 1700 06/08/18 0309  Weight: 70.3 kg 70.3 kg 59.7 kg    Examination:  General exam: Appears calm and comfortable. Respiratory system: Clear to  auscultation.  Cardiovascular system: S1 & S2, tachycardic. No pedal edema. Gastrointestinal system: Abdomen is nondistended, soft and nontender. No organomegaly or masses felt. Normal bowel sounds heard. Central nervous system: Follows command.  Patient moves all limbs. Extremities: No leg edema.     Data Reviewed: I have personally reviewed following labs and imaging studies  CBC: Recent Labs  Lab 06/06/18 1322 06/07/18 0805 06/08/18 0230  WBC 18.7* 14.4* 8.6  NEUTROABS  --   --  6.1  HGB 14.7 9.7* 9.0*  HCT 49.1* 33.4* 29.7*  MCV 87.7 87.7 85.8  PLT 284 151 569*   Basic Metabolic Panel: Recent Labs  Lab 06/06/18 1322  06/08/18 0230 06/08/18 0958 06/08/18 1636 06/09/18 0220 06/09/18 0920  NA 169*   < > 151* 153* 153* 152* 151*  K 4.2   < > 2.8* 2.9* 2.7* 2.9* 2.8*  CL 119*   < > 119* 124* 124* 120* 121*  CO2 25   < > 23 22 23 23 23   GLUCOSE 133*   < > 128* 104* 140* 124* 122*  BUN 103*   < > 44* 35* 31* 23* 21*  CREATININE 5.03*   < > 2.65* 2.54* 2.39* 2.15* 1.99*  CALCIUM 12.1*   < > 9.4 9.3 9.4 9.2 9.3  MG 3.9*  --  2.2  --   --   --   --   PHOS  --   --  2.6 2.5  2.4* 2.3* 2.0*   < > = values in this interval not displayed.   GFR: Estimated Creatinine Clearance: 28.1 mL/min (A) (by C-G formula based on SCr of 1.99 mg/dL (H)). Liver Function Tests: Recent Labs  Lab 06/06/18 1322 06/07/18 0453 06/08/18 0230 06/08/18 0958 06/08/18 1636 06/09/18 0220 06/09/18 0920  AST 23 20  --   --   --   --   --   ALT 19 13  --   --   --   --   --   ALKPHOS 97 63  --   --   --   --   --   BILITOT 2.0* 1.3*  --   --   --   --   --   PROT 9.9* 6.1*  --   --   --   --   --   ALBUMIN 5.8* 3.8 3.2* 3.1* 3.0* 3.0* 2.9*   No results for input(s): LIPASE, AMYLASE in the last 168 hours. Recent Labs  Lab 06/07/18 1759  AMMONIA 45*   Coagulation Profile: No results for input(s): INR, PROTIME in the last 168 hours. Cardiac Enzymes: Recent Labs  Lab 06/06/18 2138  06/08/18 0230  CKTOTAL 76 54   BNP (last 3 results) No results for input(s): PROBNP in the last 8760 hours. HbA1C: No results for input(s): HGBA1C in the last 72 hours. CBG: Recent Labs  Lab 06/06/18 1227 06/07/18 1753  GLUCAP 126* 138*   Lipid Profile: No results for input(s): CHOL, HDL, LDLCALC, TRIG, CHOLHDL, LDLDIRECT in the last 72 hours. Thyroid Function Tests: No results for input(s): TSH, T4TOTAL, FREET4, T3FREE, THYROIDAB in the last 72 hours. Anemia Panel: No results for input(s): VITAMINB12, FOLATE, FERRITIN, TIBC, IRON, RETICCTPCT in the last 72 hours. Urine analysis:    Component Value Date/Time   COLORURINE STRAW (A) 06/08/2018 0322   APPEARANCEUR CLEAR 06/08/2018 0322   LABSPEC 1.003 (L) 06/08/2018 0322   PHURINE 6.0 06/08/2018 0322   GLUCOSEU NEGATIVE 06/08/2018 0322   HGBUR MODERATE (A) 06/08/2018 0322   BILIRUBINUR NEGATIVE 06/08/2018 0322   KETONESUR NEGATIVE 06/08/2018 0322   PROTEINUR NEGATIVE 06/08/2018 0322   UROBILINOGEN 0.2 09/25/2013 1322   NITRITE NEGATIVE 06/08/2018 0322   LEUKOCYTESUR TRACE (A) 06/08/2018 0322   Sepsis Labs: @LABRCNTIP (procalcitonin:4,lacticidven:4)  ) Recent Results (from the past 240 hour(s))  Blood Culture (routine x 2)     Status: None (Preliminary result)   Collection Time: 06/06/18  2:53 PM  Result Value Ref Range Status   Specimen Description SITE NOT SPECIFIED  Final   Special Requests   Final    BOTTLES DRAWN AEROBIC ONLY Blood Culture results may not be optimal due to an inadequate volume of blood received in culture bottles   Culture   Final    NO GROWTH 2 DAYS Performed at [facility], [place] [address] [place], [place] [address]    Report Status PENDING  Incomplete  Blood Culture (routine x 2)     Status: None (Preliminary result)   Collection Time: 06/06/18  3:10 PM  Result Value Ref Range Status   Specimen Description BLOOD RIGHT ARM UPPER  Final   Special Requests   Final    BOTTLES DRAWN  AEROBIC AND ANAEROBIC Blood Culture adequate volume   Culture   Final    NO GROWTH 2 DAYS Performed at [facility], [place] [address] [place], [place] [address]    Report Status PENDING  Incomplete  Urine culture  Status: None   Collection Time: 06/06/18  3:42 PM  Result Value Ref Range Status   Specimen Description URINE, CATHETERIZED  Final   Special Requests NONE  Final   Culture   Final    NO GROWTH Performed at [facility], [address] [place], [place] [address]    Report Status 06/07/2018 FINAL  Final  MRSA PCR Screening     Status: None   Collection Time: 06/06/18  6:01 PM  Result Value Ref Range Status   MRSA by PCR NEGATIVE NEGATIVE Final    Comment:        The GeneXpert MRSA Assay (FDA approved for NASAL specimens only), is one component of a comprehensive MRSA colonization surveillance program. It is not intended to diagnose MRSA infection nor to guide or monitor treatment for MRSA infections. Performed at [facility], [place] [address] [place], [place] [address]          Radiology Studies: Dg Chest Port 1 View  Result Date: 06/08/2018 CLINICAL DATA:  Leukocytosis. EXAM: PORTABLE CHEST 1 VIEW COMPARISON:  Radiographs of June 06, 2018. FINDINGS: The heart size and mediastinal contours are within normal limits. Both lungs are clear. The visualized skeletal structures are unremarkable. IMPRESSION: No active disease. Electronically Signed   By: Marijo Conception, M.D.   On: 06/08/2018 09:09        Scheduled Meds: . feeding supplement  1 Container Oral TID BM  . mouth rinse  15 mL Mouth Rinse BID  . sodium chloride flush  3 mL Intravenous Q12H   Continuous Infusions: . sodium chloride Stopped (06/06/18 1948)  . sodium chloride 500 mL (06/07/18 0950)  . sodium chloride    . argatroban 1 mcg/kg/min (06/09/18 1022)  . ceFEPime (MAXIPIME) IV 1 g (06/09/18 1414)  . dextrose 125 mL/hr at 06/09/18 1415  . metronidazole 500 mg  (06/09/18 1311)  . sodium chloride    . vancomycin 1,000 mg (06/08/18 1813)     LOS: 3 days    Time spent: Greater than 35 minutes.   Dana Allan, MD  [facility] Pager #: [phone] 7PM-7AM contact night coverage as above

## 2018-06-09 NOTE — Progress Notes (Signed)
Pharmacy Antibiotic Note  Tara Hurst is a 57 y.o. female admitted on 06/06/2018 with sepsis.  Pharmacy has been consulted for Vancomycin and Cefepime dosing. Pt also on Flagyl.  Scr much improved Cultures negative to date Confusion improved Afebrile  Plan: Cefepime 1gm IV q24h Metronidazole 500 mg iv Q 8 Vancomycin 1 gram iv Q 48 hours   Begin to de-escalate antibiotics in next 24 to 48 hours ?  Height: 5\' 5"  (165.1 cm) Weight: 131 lb 9.8 oz (59.7 kg) IBW/kg (Calculated) : 57  Temp (24hrs), Avg:98.1 F (36.7 C), Min:97.6 F (36.4 C), Max:98.8 F (37.1 C)  Recent Labs  Lab 06/06/18 1322 06/06/18 1340 06/06/18 1520  06/06/18 2138  06/07/18 0453 06/07/18 0805 06/08/18 0230 06/08/18 0958 06/08/18 1636 06/09/18 0220 06/09/18 0920  WBC 18.7*  --   --   --   --   --   --  14.4* 8.6  --   --   --   --   CREATININE 5.03*  --   --    < > 4.00*   < > 3.62*  --  2.65* 2.54* 2.39* 2.15* 1.99*  LATICACIDVEN  --  4.97* 3.77*  --  2.6*  --  2.1* 2.7*  --   --   --   --   --    < > = values in this interval not displayed.    Estimated Creatinine Clearance: 28.1 mL/min (A) (by C-G formula based on SCr of 1.99 mg/dL (H)).    Allergies  Allergen Reactions  . Heparin     Heparin induce thrombocytopenia      Antimicrobials this admission: 12/12 Vanc >>  12/12 Cefepime >>  12/12 Flagyl>>  Dose adjustments this admission: n/a  Microbiology results: 12/12 BCx: NGTD 12/12 UCx:  NG  Thank you for allowing pharmacy to be a part of this patient's care.  Anette Guarneri, PharmD  **Pharmacist phone directory can now be found on [email or web address] ([email or web address]).  Listed under [place]. 06/09/2018 12:42 PM

## 2018-06-09 NOTE — Progress Notes (Signed)
ANTICOAGULATION CONSULT NOTE - Follow Up Consult  Pharmacy Consult for Argatroban  Indication: ? HIT  Allergies  Allergen Reactions  . Heparin     Heparin induce thrombocytopenia      Patient Measurements: Height: 5\' 5"  (165.1 cm) Weight: 131 lb 9.8 oz (59.7 kg) IBW/kg (Calculated) : 57   Vital Signs: Temp: 98.8 F (37.1 C) (12/15 1227) Temp Source: Oral (12/15 1227) BP: 98/64 (12/15 1227) Pulse Rate: 75 (12/15 0844)  Labs: Recent Labs    06/06/18 1322  06/06/18 2138  06/07/18 0805 06/08/18 0230  06/08/18 1636 06/09/18 0220 06/09/18 0920  HGB 14.7  --   --   --  9.7* 9.0*  --   --   --   --   HCT 49.1*  --   --   --  33.4* 29.7*  --   --   --   --   PLT 284  --   --   --  151 119*  --   --   --   --   APTT  --   --   --   --   --   --   --  59* 57*  --   CREATININE 5.03*   < > 4.00*   < >  --  2.65*   < > 2.39* 2.15* 1.99*  CKTOTAL  --   --  76  --   --  54  --   --   --   --    < > = values in this interval not displayed.    Estimated Creatinine Clearance: 28.1 mL/min (A) (by C-G formula based on SCr of 1.99 mg/dL (H)).  Assessment: 57 year old female admitted with acute encephalopathy who continues on antibiotics for possible sepsis.  Previously on sq heparin with a drop in platelet count from 284 to 119.  T score with intermediate probability for HIT.  HIT labs ordered and to begin Argatroban for treatment due to poor kidney function.  PTT = 57 seconds (therapeutic)  Goal of Therapy:  PTT goal of 50 to 90 seconds Monitor platelets by anticoagulation protocol: Yes   Plan:  Continue Argatroban at 1 mcg/kg/min Follow up daily PTT, CBC Follow up HIT labs  Thank you Anette Guarneri, PharmD [phone]  06/09/2018,12:41 PM

## 2018-06-10 LAB — CBC
HCT: 29.5 % — ABNORMAL LOW (ref 36.0–46.0)
Hemoglobin: 8.7 g/dL — ABNORMAL LOW (ref 12.0–15.0)
MCH: 25.7 pg — ABNORMAL LOW (ref 26.0–34.0)
MCHC: 29.5 g/dL — ABNORMAL LOW (ref 30.0–36.0)
MCV: 87.3 fL (ref 80.0–100.0)
Platelets: 97 10*3/uL — ABNORMAL LOW (ref 150–400)
RBC: 3.38 MIL/uL — ABNORMAL LOW (ref 3.87–5.11)
RDW: 15.1 % (ref 11.5–15.5)
WBC: 5.7 10*3/uL (ref 4.0–10.5)
nRBC: 0 % (ref 0.0–0.2)

## 2018-06-10 LAB — MAGNESIUM: Magnesium: 1.9 mg/dL (ref 1.7–2.4)

## 2018-06-10 LAB — RENAL FUNCTION PANEL
Albumin: 2.8 g/dL — ABNORMAL LOW (ref 3.5–5.0)
Anion gap: 7 (ref 5–15)
BUN: 14 mg/dL (ref 6–20)
CO2: 22 mmol/L (ref 22–32)
Calcium: 9.3 mg/dL (ref 8.9–10.3)
Chloride: 122 mmol/L — ABNORMAL HIGH (ref 98–111)
Creatinine, Ser: 1.95 mg/dL — ABNORMAL HIGH (ref 0.44–1.00)
GFR calc Af Amer: 32 mL/min — ABNORMAL LOW (ref >60)
GFR calc non Af Amer: 28 mL/min — ABNORMAL LOW (ref >60)
Glucose, Bld: 116 mg/dL — ABNORMAL HIGH (ref 70–99)
Phosphorus: 2.8 mg/dL (ref 2.5–4.6)
Potassium: 3.9 mmol/L (ref 3.5–5.1)
Sodium: 151 mmol/L — ABNORMAL HIGH (ref 135–145)

## 2018-06-10 LAB — HEPARIN INDUCED PLATELET AB (HIT ANTIBODY): Heparin Induced Plt Ab: 0.176 OD (ref 0.000–0.400)

## 2018-06-10 LAB — APTT: aPTT: 56 seconds — ABNORMAL HIGH (ref 24–36)

## 2018-06-10 MED ORDER — ACETAMINOPHEN 325 MG PO TABS
650.0000 mg | ORAL_TABLET | Freq: Four times a day (QID) | ORAL | Status: DC | PRN
  Filled 2018-06-10 (×2): qty 2

## 2018-06-10 MED ORDER — K PHOS MONO-SOD PHOS DI & MONO 155-852-130 MG PO TABS
500.0000 mg | ORAL_TABLET | Freq: Two times a day (BID) | ORAL | Status: DC
  Administered 2018-06-10 (×2): 500 mg via ORAL
  Filled 2018-06-10 (×2): qty 2

## 2018-06-10 NOTE — Progress Notes (Addendum)
[facility] TEAM 1 - Stepdown/ICU TEAM  Tara Hurst  QQI:[id number] DOB: March 29, 1961 DOA: 06/06/2018 PCP: [name], MD    Brief Narrative:  57yo F w/ a hx of HTN, CKD stage III, chronic anemia, chronic intermittent hypernatremia, generalized anxiety disorder, and schizoaffective disorder who was found altered by a social worker who went to perform a wellness check.  On presentation to the hospital she was unresponsive, afebrile, heart rate 122 bpm blood pressure was 115/94 mmHg. Lactic acid was 4.97, sodium 169, BUN 103, and creatinine 5.03. CT head without contrast did not show any acute intracranial pathology.    Subjective: Alert and conversant. C/o generalized weakness. Denies SSCP, n/v, abdom pain. Is very hungry. Appears mildly confused, but is oriented.   Assessment & Plan:  Acute encephalopathy Due primarily to uremia and hypernatremia, though initial etiology not fully clear - cont to volume expand - begin diet - check B12 and folate - significance of elevated ammonia not clear  Hypernatremia  Free water deficit by calculation was greater than7 L on presentation - Na slowly improving - cont free water - her baseline Na appears to be ~148  AKI on CKD stage III crt stabilizing at ~1.9 - follow trend  Recent Labs  Lab 06/08/18 1636 06/09/18 0220 06/09/18 0920 06/09/18 1824 06/10/18 0218  CREATININE 2.39* 2.15* 1.99* 1.90* 1.95*    SIRS No evidence of acute infection - now off abx - follow   Hypertension Not an active issue for now - cont to hydrate and follow trend   ?HIT Plt count dropped signif while on SQ heparin - ?simply due to dilution w/ IVF resucitation - HIT panel pending - cont argatraban for now   DVT prophylaxis: argatraban   Code Status: FULL CODE Family Communication: no family present at time of exam  Disposition Plan:  Stable for tele bed - begin PT/OT - follow Na, BP, and renal fxn   Consultants:  None   Antimicrobials:  None  presently   Objective: Blood pressure 95/63, pulse 83, temperature 98.8 F (37.1 C), temperature source Oral, resp. rate 10, height 5\' 5"  (1.651 m), weight 59.7 kg, SpO2 100 %.  Intake/Output Summary (Last 24 hours) at 06/10/2018 1536 Last data filed at 06/10/2018 1500 Gross per 24 hour  Intake 3167.83 ml  Output 3175 ml  Net -7.17 ml   Filed Weights   06/06/18 1219 06/06/18 1700 06/08/18 0309  Weight: 70.3 kg 70.3 kg 59.7 kg    Examination: General: No acute respiratory distress Lungs: Clear to auscultation bilaterally without wheezes or crackles Cardiovascular: Regular rate and rhythm without murmur gallop or rub normal S1 and S2 Abdomen: Nontender, nondistended, soft, bowel sounds positive, no rebound, no ascites, no appreciable mass Extremities: No significant cyanosis, clubbing, or edema bilateral lower extremities  CBC: Recent Labs  Lab 06/07/18 0805 06/08/18 0230 06/10/18 0218  WBC 14.4* 8.6 5.7  NEUTROABS  --  6.1  --   HGB 9.7* 9.0* 8.7*  HCT 33.4* 29.7* 29.5*  MCV 87.7 85.8 87.3  PLT 151 119* 97*   Basic Metabolic Panel: Recent Labs  Lab 06/06/18 1322  06/08/18 0230  06/09/18 0920 06/09/18 1824 06/10/18 0218  NA 169*   < > 151*   < > 151* 148* 151*  K 4.2   < > 2.8*   < > 2.8* 4.1 3.9  CL 119*   < > 119*   < > 121* 119* 122*  CO2 25   < > 23   < >  23 21* 22  GLUCOSE 133*   < > 128*   < > 122* 111* 116*  BUN 103*   < > 44*   < > 21* 17 14  CREATININE 5.03*   < > 2.65*   < > 1.99* 1.90* 1.95*  CALCIUM 12.1*   < > 9.4   < > 9.3 8.8* 9.3  MG 3.9*  --  2.2  --   --   --  1.9  PHOS  --   --  2.6   < > 2.0* 1.5* 2.8   < > = values in this interval not displayed.   GFR: Estimated Creatinine Clearance: 28.6 mL/min (A) (by C-G formula based on SCr of 1.95 mg/dL (H)).  Liver Function Tests: Recent Labs  Lab 06/06/18 1322 06/07/18 0453  06/09/18 0220 06/09/18 0920 06/09/18 1824 06/10/18 0218  AST 23 20  --   --   --   --   --   ALT 19 13  --   --    --   --   --   ALKPHOS 97 63  --   --   --   --   --   BILITOT 2.0* 1.3*  --   --   --   --   --   PROT 9.9* 6.1*  --   --   --   --   --   ALBUMIN 5.8* 3.8   < > 3.0* 2.9* 2.8* 2.8*   < > = values in this interval not displayed.    Recent Labs  Lab 06/07/18 1759  AMMONIA 45*    Cardiac Enzymes: Recent Labs  Lab 06/06/18 2138 06/08/18 0230  CKTOTAL 76 54   CBG: Recent Labs  Lab 06/06/18 1227 06/07/18 1753  GLUCAP 126* 138*    Recent Results (from the past 240 hour(s))  Blood Culture (routine x 2)     Status: None (Preliminary result)   Collection Time: 06/06/18  2:53 PM  Result Value Ref Range Status   Specimen Description SITE NOT SPECIFIED  Final   Special Requests   Final    BOTTLES DRAWN AEROBIC ONLY Blood Culture results may not be optimal due to an inadequate volume of blood received in culture bottles   Culture   Final    NO GROWTH 4 DAYS Performed at [facility], [place] [address] [place], [place] [address]    Report Status PENDING  Incomplete  Blood Culture (routine x 2)     Status: None (Preliminary result)   Collection Time: 06/06/18  3:10 PM  Result Value Ref Range Status   Specimen Description BLOOD RIGHT ARM UPPER  Final   Special Requests   Final    BOTTLES DRAWN AEROBIC AND ANAEROBIC Blood Culture adequate volume   Culture   Final    NO GROWTH 4 DAYS Performed at [facility], [place] [address], [place], [place] [address]    Report Status PENDING  Incomplete  Urine culture     Status: None   Collection Time: 06/06/18  3:42 PM  Result Value Ref Range Status   Specimen Description URINE, CATHETERIZED  Final   Special Requests NONE  Final   Culture   Final    NO GROWTH Performed at [facility], [address], [place], [place] [address]    Report Status 06/07/2018 FINAL  Final  MRSA PCR Screening     Status: None   Collection Time: 06/06/18  6:01 PM  Result Value Ref Range Status   MRSA by PCR NEGATIVE NEGATIVE Final     Comment:        The GeneXpert MRSA Assay (FDA approved for NASAL specimens only), is one component of a comprehensive MRSA colonization surveillance program. It is not intended to diagnose MRSA infection nor to guide or monitor treatment for MRSA infections. Performed at [facility], [place] [address] [place], [place] [address]      Scheduled Meds: . feeding supplement  1 Container Oral TID BM  . mouth rinse  15 mL Mouth Rinse BID  . phosphorus  500 mg Oral BID   Continuous Infusions: . sodium chloride Stopped (06/06/18 1948)  . argatroban 1 mcg/kg/min (06/10/18 1529)  . dextrose 125 mL/hr at 06/10/18 1119  . sodium chloride       LOS: 4 days   Cherene Altes, MD [facility] Office  [phone] Pager - Text Page per Amion  If 7PM-7AM, please contact night-coverage per Amion 06/10/2018, 3:36 PM

## 2018-06-10 NOTE — Progress Notes (Signed)
ANTICOAGULATION CONSULT NOTE - Follow Up Consult  Pharmacy Consult for Argatroban  Indication: ? HIT  Allergies  Allergen Reactions  . Heparin     Heparin induce thrombocytopenia      Patient Measurements: Height: 5\' 5"  (165.1 cm) Weight: 131 lb 9.8 oz (59.7 kg) IBW/kg (Calculated) : 57   Vital Signs: Temp: 98.2 F (36.8 C) (12/16 0522) Temp Source: Oral (12/16 0522)  Labs: Recent Labs    06/08/18 0230  06/08/18 1636 06/09/18 0220 06/09/18 0920 06/09/18 1824 06/10/18 0218  HGB 9.0*  --   --   --   --   --  8.7*  HCT 29.7*  --   --   --   --   --  29.5*  PLT 119*  --   --   --   --   --  97*  APTT  --   --  59* 57*  --   --  56*  CREATININE 2.65*   < > 2.39* 2.15* 1.99* 1.90* 1.95*  CKTOTAL 54  --   --   --   --   --   --    < > = values in this interval not displayed.    Estimated Creatinine Clearance: 28.6 mL/min (A) (by C-G formula based on SCr of 1.95 mg/dL (H)).  Assessment: 57 year old female admitted with acute encephalopathy who continues on antibiotics for possible sepsis.  Previously on sq heparin with a drop in platelet count from 284 to 119.  T score with intermediate probability for HIT.  HIT labs ordered and to begin Argatroban for treatment due to poor kidney function.  aPTT therapeutic today at 56 sec, plts remain low at 97 from 115 on 12/14.  HIT ab still pending from 12/14.  Goal of Therapy:  PTT goal of 50 to 90 seconds Monitor platelets by anticoagulation protocol: Yes   Plan:  Continue argatrogan gtt at 1 mcg/kg/min Daily aPTT, CBC F/u HIT ab results  Bertis Ruddy, PharmD Clinical Pharmacist Please check AMION for all [place] numbers 06/10/2018 8:48 AM

## 2018-06-11 LAB — CBC
HCT: 26.8 % — ABNORMAL LOW (ref 36.0–46.0)
Hemoglobin: 8.6 g/dL — ABNORMAL LOW (ref 12.0–15.0)
MCH: 27.3 pg (ref 26.0–34.0)
MCHC: 32.1 g/dL (ref 30.0–36.0)
MCV: 85.1 fL (ref 80.0–100.0)
Platelets: 94 10*3/uL — ABNORMAL LOW (ref 150–400)
RBC: 3.15 MIL/uL — ABNORMAL LOW (ref 3.87–5.11)
RDW: 15.3 % (ref 11.5–15.5)
WBC: 7.5 10*3/uL (ref 4.0–10.5)
nRBC: 0 % (ref 0.0–0.2)

## 2018-06-11 LAB — CULTURE, BLOOD (ROUTINE X 2)
CULTURE: NO GROWTH
Culture: NO GROWTH
Special Requests: ADEQUATE

## 2018-06-11 LAB — APTT: aPTT: 50 seconds — ABNORMAL HIGH (ref 24–36)

## 2018-06-11 LAB — COMPREHENSIVE METABOLIC PANEL
ALBUMIN: 2.8 g/dL — AB (ref 3.5–5.0)
ALT: 17 U/L (ref 0–44)
AST: 17 U/L (ref 15–41)
Alkaline Phosphatase: 50 U/L (ref 38–126)
Anion gap: 8 (ref 5–15)
BUN: 10 mg/dL (ref 6–20)
CO2: 25 mmol/L (ref 22–32)
Calcium: 8.6 mg/dL — ABNORMAL LOW (ref 8.9–10.3)
Chloride: 111 mmol/L (ref 98–111)
Creatinine, Ser: 1.91 mg/dL — ABNORMAL HIGH (ref 0.44–1.00)
GFR calc Af Amer: 33 mL/min — ABNORMAL LOW (ref >60)
GFR calc non Af Amer: 29 mL/min — ABNORMAL LOW (ref >60)
Glucose, Bld: 125 mg/dL — ABNORMAL HIGH (ref 70–99)
Potassium: 3 mmol/L — ABNORMAL LOW (ref 3.5–5.1)
Sodium: 144 mmol/L (ref 135–145)
Total Bilirubin: 0.7 mg/dL (ref 0.3–1.2)
Total Protein: 5 g/dL — ABNORMAL LOW (ref 6.5–8.1)

## 2018-06-11 LAB — FOLATE: Folate: 5.5 ng/mL — ABNORMAL LOW (ref >5.9)

## 2018-06-11 LAB — AMMONIA: Ammonia: 47 umol/L — ABNORMAL HIGH (ref 9–35)

## 2018-06-11 LAB — VITAMIN B12: Vitamin B-12: 649 pg/mL (ref 180–914)

## 2018-06-11 LAB — PHOSPHORUS: Phosphorus: 2.6 mg/dL (ref 2.5–4.6)

## 2018-06-11 LAB — MAGNESIUM: MAGNESIUM: 1.6 mg/dL — AB (ref 1.7–2.4)

## 2018-06-11 MED ORDER — POTASSIUM CHLORIDE CRYS ER 20 MEQ PO TBCR
20.0000 meq | EXTENDED_RELEASE_TABLET | Freq: Once | ORAL | Status: AC
  Administered 2018-06-11: 20 meq via ORAL
  Filled 2018-06-11: qty 1

## 2018-06-11 MED ORDER — CLONAZEPAM 0.5 MG PO TABS
0.5000 mg | ORAL_TABLET | Freq: Two times a day (BID) | ORAL | Status: DC
  Administered 2018-06-11 – 2018-06-14 (×7): 0.5 mg via ORAL
  Filled 2018-06-11 (×7): qty 1

## 2018-06-11 MED ORDER — LACTATED RINGERS IV SOLN
INTRAVENOUS | Status: DC
  Administered 2018-06-11 – 2018-06-12 (×2): via INTRAVENOUS

## 2018-06-11 MED ORDER — BUPROPION HCL ER (XL) 150 MG PO TB24
450.0000 mg | ORAL_TABLET | Freq: Every day | ORAL | Status: DC
  Administered 2018-06-11 – 2018-06-14 (×4): 450 mg via ORAL
  Filled 2018-06-11 (×4): qty 3

## 2018-06-11 MED ORDER — POTASSIUM CHLORIDE CRYS ER 20 MEQ PO TBCR
40.0000 meq | EXTENDED_RELEASE_TABLET | Freq: Once | ORAL | Status: AC
  Administered 2018-06-11: 40 meq via ORAL
  Filled 2018-06-11: qty 2

## 2018-06-11 MED ORDER — CLONAZEPAM 0.5 MG PO TABS: 0.5000 mg | ORAL_TABLET | Freq: Every day | ORAL | Status: DC

## 2018-06-11 MED ORDER — MAGNESIUM SULFATE 2 GM/50ML IV SOLN
2.0000 g | Freq: Once | INTRAVENOUS | Status: AC
  Administered 2018-06-11: 2 g via INTRAVENOUS
  Filled 2018-06-11: qty 50

## 2018-06-11 MED ORDER — BENZTROPINE MESYLATE 1 MG PO TABS
0.5000 mg | ORAL_TABLET | Freq: Two times a day (BID) | ORAL | Status: DC
  Administered 2018-06-11 – 2018-06-14 (×7): 0.5 mg via ORAL
  Filled 2018-06-11 (×7): qty 1

## 2018-06-11 MED ORDER — LURASIDONE HCL 60 MG PO TABS: 60.0000 mg | ORAL_TABLET | Freq: Every day | ORAL | Status: DC

## 2018-06-11 MED ORDER — RISPERIDONE 0.5 MG PO TABS
3.0000 mg | ORAL_TABLET | Freq: Two times a day (BID) | ORAL | Status: DC
  Administered 2018-06-11 – 2018-06-14 (×7): 3 mg via ORAL
  Filled 2018-06-11 (×7): qty 6

## 2018-06-11 NOTE — Progress Notes (Signed)
CSW spoke with patient's emergency contact, Judson Roch. She reported that she has not seen patient in a while and does not have knowledge of her financials for SNF placement. She stated she would be happy to help in any other way.   Percell Locus Kallum Jorgensen LCSW [phone]

## 2018-06-11 NOTE — Evaluation (Signed)
Physical Therapy Evaluation Patient Details Name: Tara Hurst MRN: [id number] DOB: 01-18-1961 Today's Date: 06/11/2018   History of Present Illness  57yo F w/ a hx of HTN, CKD stage III, chronic anemia, chronic intermittent hypernatremia, generalized anxiety disorder, and schizoaffective disorder who was found altered by a Education officer, museum who went to perform a wellness check. Pt with AKI on CKD, hypernatremia, SIRS, CT head neg for acute event.    Clinical Impression  Pt admitted with above diagnosis. Pt currently with functional limitations due to the deficits listed below (see PT Problem List). Pt dizzy in supine, sitting, and standing, see orthostatics below. Pt ambulated 20' with min HHA +2 for safety at which point she became more dizzy and had LOB and was assisted to sitting in chair. Pt unsafe to be home alone at this point.  Pt will benefit from skilled PT to increase their independence and safety with mobility to allow discharge to the venue listed below.   BP supine 99/65  HR 83 bpm  SpO2 99%       Sitting  98/76   HR  93 bpm  SpO2 100%       Standing 99/84  HR 107 bpm SpO2 100%    Follow Up Recommendations SNF;Supervision/Assistance - 24 hour    Equipment Recommendations  Other (comment)(TBD)    Recommendations for Other Services       Precautions / Restrictions Precautions Precautions: Fall Precaution Comments: pt denies falls but has scabs on both knees Restrictions Weight Bearing Restrictions: No      Mobility  Bed Mobility Overal bed mobility: Needs Assistance Bed Mobility: Supine to Sit     Supine to sit: Supervision     General bed mobility comments: pt c/o dizziness in supine as well as sitting and keeps eyes closed unless cued  Transfers Overall transfer level: Needs assistance Equipment used: 1 person hand held assist Transfers: Sit to/from Stand Sit to Stand: Min assist         General transfer comment: min A to  steady  Ambulation/Gait Ambulation/Gait assistance: Min assist;+2 safety/equipment Gait Distance (Feet): 20 Feet Assistive device: 1 person hand held assist Gait Pattern/deviations: Step-through pattern;Wide base of support Gait velocity: decreased Gait velocity interpretation: <1.31 ft/sec, indicative of household ambulator General Gait Details: pt with very slow, unsteady gait, min A +2 for safety and equipment, HHA given. At 20', pt began to sway and grabbed to rail and chair brought for pt to sit.   Stairs            Wheelchair Mobility    Modified Rankin (Stroke Patients Only)       Balance Overall balance assessment: Needs assistance Sitting-balance support: Single extremity supported Sitting balance-[name]: Fair     Standing balance support: Single extremity supported Standing balance-[name]: Poor Standing balance comment: unsafe in standing without UE support at this time                             Pertinent Vitals/Pain Pain Assessment: No/denies pain    Home Living Family/patient expects to be discharged to:: Private residence Living Arrangements: Alone Available Help at Discharge: Friend(s);Available PRN/intermittently Type of Home: Apartment Home Access: Level entry     Home Layout: One level Home Equipment: None Additional Comments: Pt provided some information but unsure of accuracy.    Prior Function Level of Independence: Independent         Comments: Pt  states she is independent but has a friend that will take her to grocery store if she needs groceries or to appointments. Does not drive. Pt reports that she does own bill pay and housekeeping, has social worker whom was doing a Comptroller and found pt unresponsive prior to this admission.     Hand Dominance   Dominant Hand: Right    Extremity/Trunk Assessment   Upper Extremity Assessment Upper Extremity Assessment: Defer to OT evaluation    Lower Extremity  Assessment Lower Extremity Assessment: Generalized weakness    Cervical / Trunk Assessment Cervical / Trunk Assessment: Normal  Communication   Communication: No difficulties  Cognition Arousal/Alertness: Awake/alert Behavior During Therapy: Flat affect Overall Cognitive Status: No family/caregiver present to determine baseline cognitive functioning                                 General Comments: cannot give much detailed info, suspect STM deficits, no family present      General Comments      Exercises     Assessment/Plan    PT Assessment Patient needs continued PT services  PT Problem List Decreased strength;Decreased activity tolerance;Decreased balance;Decreased mobility;Decreased coordination;Decreased cognition;Decreased knowledge of use of DME;Decreased knowledge of precautions       PT Treatment Interventions DME instruction;Gait training;Functional mobility training;Therapeutic activities;Balance training;Therapeutic exercise;Patient/family education;Cognitive remediation    PT Goals (Current goals can be found in the Care Plan section)  Acute Rehab PT Goals Patient Stated Goal: return home PT Goal Formulation: With patient Time For Goal Achievement: 06/25/18 Potential to Achieve Goals: Good    Frequency Min 2X/week   Barriers to discharge Decreased caregiver support lives alone, does not verbalize any consistent support    Co-evaluation PT/OT/SLP Co-Evaluation/Treatment: Yes Reason for Co-Treatment: Necessary to address cognition/behavior during functional activity;For patient/therapist safety PT goals addressed during session: Mobility/safety with mobility;Balance         AM-PAC PT "6 Clicks" Mobility  Outcome Measure Help needed turning from your back to your side while in a flat bed without using bedrails?: None Help needed moving from lying on your back to sitting on the side of a flat bed without using bedrails?: None Help needed  moving to and from a bed to a chair (including a wheelchair)?: A Little Help needed standing up from a chair using your arms (e.g., wheelchair or bedside chair)?: A Little Help needed to walk in hospital room?: A Little Help needed climbing 3-5 steps with a railing? : A Little 6 Click Score: 20    End of Session Equipment Utilized During Treatment: Gait belt Activity Tolerance: Treatment limited secondary to medical complications (Comment)(dizziness) Patient left: in chair;with call bell/phone within reach;with chair alarm set Nurse Communication: Mobility status PT Visit Diagnosis: Unsteadiness on feet (R26.81);Dizziness and giddiness (R42);Difficulty in walking, not elsewhere classified (R26.2);Muscle weakness (generalized) (M62.81)    Time: 0263-7858 PT Time Calculation (min) (ACUTE ONLY): 31 min   Charges:   PT Evaluation $PT Eval Moderate Complexity: [place]  Pager [phone] Office [place] 06/11/2018, 10:12 AM

## 2018-06-11 NOTE — NC FL2 (Signed)
[place] LEVEL OF CARE SCREENING TOOL     IDENTIFICATION  Patient Name: Tara Hurst Birthdate: [birth year] Sex: female Admission Date (Current Location): 06/06/2018  [facility] and [place] Number:  Herbalist and Address:  [facility] [place], [place] [address] [place], [place] [address]      Provider Number: [id number]  Attending Physician Name and Address:  Thurnell Lose, MD  Relative Name and Phone Number:       Current Level of Care: Hospital Recommended Level of Care: [place] Prior Approval Number:    Date Approved/Denied:   PASRR Number:    Discharge Plan: SNF    Current Diagnoses: Patient Active Problem List   Diagnosis Date Noted  . AKI (acute kidney injury) ([place]) 06/06/2018  . Sepsis due to undetermined organism ([place]) 06/06/2018  . Tachycardia 12/03/2017  . Acute lower UTI 12/03/2017  . GAD (generalized anxiety disorder) 10/19/2017  . Anxiety   . UTI due to Klebsiella species   . Hypernatremia   . Acute encephalopathy 05/03/2017  . ARF (acute renal failure) ([place]) 05/03/2017  . HCAP (healthcare-associated pneumonia) 09/25/2013  . Hypertension 09/25/2013  . Abnormal lithium level in blood 09/25/2013    Orientation RESPIRATION BLADDER Height & Weight     Self, Situation, Place  Normal Indwelling catheter, Continent Weight: 59.7 kg Height:  5\' 5"  (165.1 cm)  BEHAVIORAL SYMPTOMS/MOOD NEUROLOGICAL BOWEL NUTRITION STATUS      Continent Diet(Please see DC Summary)  AMBULATORY STATUS COMMUNICATION OF NEEDS Skin   Limited Assist Verbally Normal                       Personal Care Assistance Level of Assistance  Bathing, Feeding, Dressing Bathing Assistance: Limited assistance Feeding assistance: Limited assistance Dressing Assistance: Limited assistance     Functional Limitations Info  Sight, Hearing, Speech Sight Info: Adequate Hearing Info: Adequate Speech Info: Adequate    SPECIAL  CARE FACTORS FREQUENCY  PT (By licensed PT), OT (By licensed OT)     PT Frequency: 3x/week OT Frequency: 3x/week            Contractures Contractures Info: Not present    Additional Factors Info  Psychotropic Code Status Info: Full Allergies Info: NKA Psychotropic Info: Risperdal;Wellbutrin;Klonopin         Current Medications (06/11/2018):  This is the current hospital active medication list Current Facility-Administered Medications  Medication Dose Route Frequency Provider Last Rate Last Dose  . 0.9 %  sodium chloride infusion   Intravenous PRN Reubin Milan, MD   Stopped at 06/06/18 1948  . acetaminophen (TYLENOL) tablet 650 mg  650 mg Oral Q6H PRN Joette Catching T, MD      . benztropine (COGENTIN) tablet 0.5 mg  0.5 mg Oral BID Thurnell Lose, MD   0.5 mg at 06/11/18 1111  . buPROPion (WELLBUTRIN XL) 24 hr tablet 450 mg  450 mg Oral Daily Thurnell Lose, MD   450 mg at 06/11/18 1111  . clonazePAM (KLONOPIN) tablet 0.5 mg  0.5 mg Oral BID Thurnell Lose, MD   0.5 mg at 06/11/18 1111  . feeding supplement (BOOST / RESOURCE BREEZE) liquid 1 Container  1 Container Oral TID BM Bonnell Public, MD   1 Container at 06/11/18 1523  . lactated ringers infusion   Intravenous Continuous Thurnell Lose, MD 75 mL/hr at 06/11/18 1026    . MEDLINE mouth rinse  15 mL  Mouth Rinse BID Reubin Milan, MD   15 mL at 06/11/18 1027  . ondansetron (ZOFRAN) tablet 4 mg  4 mg Oral Q6H PRN Reubin Milan, MD       Or  . ondansetron [facility]) injection 4 mg  4 mg Intravenous Q6H PRN Reubin Milan, MD      . risperiDONE (RISPERDAL) tablet 3 mg  3 mg Oral BID Thurnell Lose, MD   3 mg at 06/11/18 1114  . sodium chloride flush (NS) 0.9 % injection 3 mL  3 mL Intravenous PRN Bonnell Public, MD         Discharge Medications: Please see discharge summary for a list of discharge medications.  Relevant Imaging Results:  Relevant Lab  Results:   Additional Information SSN: [place] [address] [place], [place]

## 2018-06-11 NOTE — Clinical Social Work Note (Signed)
Clinical Social Work Assessment  Patient Details  Name: Tara Hurst MRN: [id number] Date of Birth: 12-23-1960  Date of referral:  06/11/18               Reason for consult:  Facility Placement                Permission sought to share information with:  Facility Sport and exercise psychologist Permission granted to share information::  Yes, Verbal Permission Granted  Name::     Medical illustrator::  SNFs  Relationship::  Friend  Contact Information:  [phone]  Housing/Transportation Living arrangements for the past 2 months:  Apartment Source of Information:  Patient Patient Interpreter Needed:  None Criminal Activity/Legal Involvement Pertinent to Current Situation/Hospitalization:  No - Comment as needed Significant Relationships:  Friend Lives with:  Self Do you feel safe going back to the place where you live?  Yes Need for family participation in patient care:  No (Coment)  Care giving concerns:  CSW received consult for possible SNF placement at time of discharge. CSW spoke with patient regarding PT recommendation of SNF placement at time of discharge. Patient reported that she is not sure why she needs SNF placement. CSW also left a voicemail for her friend Judson Roch, since patient is a little confused at times. CSW to continue to follow and assist with discharge planning needs.   Social Worker assessment / plan:  CSW spoke with patient concerning possibility of rehab at [facility] before returning home.  Employment status:  Retired Forensic scientist:  Medicaid [place] PT Recommendations:  [place] / Referral to community resources:  [place]  Patient/Family's Response to care:  Patient recognizes need for rehab before returning home and is agreeable to a SNF in [facility] "for a little while". CSW explained that patient will need to signed her Medicaid check over to the facility for 30 days. Patient reports that she is not sure how much  money she gets with her Medicaid.   Patient/Family's Understanding of and Emotional Response to Diagnosis, Current Treatment, and Prognosis:  Patient/family is realistic regarding therapy needs and expressed being hopeful for return home. Patient expressed understanding of CSW role and discharge process as well as medical condition. No questions/concerns about plan or treatment.    Emotional Assessment Appearance:  Appears stated age Attitude/Demeanor/Rapport:  Engaged Affect (typically observed):  Accepting, Appropriate Orientation:  Oriented to Self, Oriented to Place Alcohol / Substance use:  Not Applicable Psych involvement (Current and /or in the community):  No (Comment)  Discharge Needs  Concerns to be addressed:  Care Coordination Readmission within the last 30 days:  No Current discharge risk:  Lives alone, Cognitively Impaired Barriers to Discharge:  Continued Medical Work up   [employer], LCSW 06/11/2018, 4:01 PM

## 2018-06-11 NOTE — Progress Notes (Addendum)
[place] TEAM 1 - Stepdown/ICU TEAM  Tara Hurst  ZES:[id number] DOB: 03/26/1961 DOA: 06/06/2018 PCP: [name], MD    Brief Narrative:  57yo F w/ a hx of HTN, CKD stage III, chronic anemia, chronic intermittent hypernatremia, generalized anxiety disorder, and schizoaffective disorder who was found altered by a social worker who went to perform a wellness check.  On presentation to the hospital she was unresponsive, afebrile, heart rate 122 bpm blood pressure was 115/94 mmHg. Lactic acid was 4.97, sodium 169, BUN 103, and creatinine 5.03. CT head without contrast did not show any acute intracranial pathology.    Subjective:  Patient in bed, appears comfortable, denies any headache, no fever, no chest pain or pressure, no shortness of breath , no abdominal pain. No focal weakness.    Assessment & Plan:  Acute anabolic encephalopathy -due to combination of dehydration, hypotension, uremia, ARF and hyponatremia.  Improved with IV fluids which will be continued, will B12, mentation close to baseline.  Home psych medications resumed.  ARF on CKD 3.  With hypernatremia.  Due to dehydration.  Improving with IV fluids continue.  SIRS -  No evidence of acute infection - now off abx - follow   Hypertension -was hypotensive, hold blood pressure medications continue hydration, blood pressure gradually improving.   Thrombocytopenia.  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  Schizoaffective disorder with anxiety.  Low-dose benzodiazepines resumed to avoid withdrawal, home psych medications resumed.  Currently not suicidal homicidal.  No active hallucinations or delusions.  Generalized weakness.  Seen by PT will require SNF.    DVT prophylaxis: SCDs (thrombocytopenia) Code Status: FULL CODE Family Communication: no family present at time of exam  Disposition Plan:  SNF  Consultants:  None   Antimicrobials:  None presently   Objective:  Blood pressure 99/65, pulse 83, temperature 98.5  F (36.9 C), temperature source Oral, resp. rate (!) 22, height 5\' 5"  (1.651 m), weight 59.7 kg, SpO2 99 %.  Intake/Output Summary (Last 24 hours) at 06/11/2018 1037 Last data filed at 06/11/2018 0628 Gross per 24 hour  Intake 3689.94 ml  Output 3100 ml  Net 589.94 ml   Filed Weights   06/06/18 1219 06/06/18 1700 06/08/18 0309  Weight: 70.3 kg 70.3 kg 59.7 kg    Examination:  Awake Alert ,No new F.N deficits,   [place].AT,PERRAL Supple Neck,No JVD, No cervical lymphadenopathy appriciated.  Symmetrical Chest wall movement, Good air movement bilaterally, CTAB RRR,No Gallops, Rubs or new Murmurs, No Parasternal Heave +ve B.Sounds, Abd Soft, No tenderness, No organomegaly appriciated, No rebound - guarding or rigidity. No Cyanosis, Clubbing or edema, No new Rash or bruise  CBC: Recent Labs  Lab 06/08/18 0230 06/10/18 0218 06/11/18 0311  WBC 8.6 5.7 7.5  NEUTROABS 6.1  --   --   HGB 9.0* 8.7* 8.6*  HCT 29.7* 29.5* 26.8*  MCV 85.8 87.3 85.1  PLT 119* 97* 94*   Basic Metabolic Panel: Recent Labs  Lab 06/08/18 0230  06/09/18 1824 06/10/18 0218 06/11/18 0311  NA 151*   < > 148* 151* 144  K 2.8*   < > 4.1 3.9 3.0*  CL 119*   < > 119* 122* 111  CO2 23   < > 21* 22 25  GLUCOSE 128*   < > 111* 116* 125*  BUN 44*   < > 17 14 10   CREATININE 2.65*   < > 1.90* 1.95* 1.91*  CALCIUM 9.4   < > 8.8* 9.3 8.6*  MG 2.2  --   --  1.9 1.6*  PHOS 2.6   < > 1.5* 2.8 2.6   < > = values in this interval not displayed.   GFR: Estimated Creatinine Clearance: 29.2 mL/min (A) (by C-G formula based on SCr of 1.91 mg/dL (H)).  Liver Function Tests: Recent Labs  Lab 06/06/18 1322 06/07/18 0453  06/09/18 0920 06/09/18 1824 06/10/18 0218 06/11/18 0311  AST 23 20  --   --   --   --  17  ALT 19 13  --   --   --   --  17  ALKPHOS 97 63  --   --   --   --  50  BILITOT 2.0* 1.3*  --   --   --   --  0.7  PROT 9.9* 6.1*  --   --   --   --  5.0*  ALBUMIN 5.8* 3.8   < > 2.9* 2.8* 2.8* 2.8*   <  > = values in this interval not displayed.    Recent Labs  Lab 06/07/18 1759 06/11/18 0311  AMMONIA 45* 47*    Cardiac Enzymes: Recent Labs  Lab 06/06/18 2138 06/08/18 0230  CKTOTAL 76 54   CBG: Recent Labs  Lab 06/06/18 1227 06/07/18 1753  GLUCAP 126* 138*    Recent Results (from the past 240 hour(s))  Blood Culture (routine x 2)     Status: None   Collection Time: 06/06/18  2:53 PM  Result Value Ref Range Status   Specimen Description SITE NOT SPECIFIED  Final   Special Requests   Final    BOTTLES DRAWN AEROBIC ONLY Blood Culture results may not be optimal due to an inadequate volume of blood received in culture bottles   Culture   Final    NO GROWTH 5 DAYS Performed at [facility], [place] [address], [place], [place] [address]    Report Status 06/11/2018 FINAL  Final  Blood Culture (routine x 2)     Status: None   Collection Time: 06/06/18  3:10 PM  Result Value Ref Range Status   Specimen Description BLOOD RIGHT ARM UPPER  Final   Special Requests   Final    BOTTLES DRAWN AEROBIC AND ANAEROBIC Blood Culture adequate volume   Culture   Final    NO GROWTH 5 DAYS Performed at [facility], [place] [address], [place], [place] [address]    Report Status 06/11/2018 FINAL  Final  Urine culture     Status: None   Collection Time: 06/06/18  3:42 PM  Result Value Ref Range Status   Specimen Description URINE, CATHETERIZED  Final   Special Requests NONE  Final   Culture   Final    NO GROWTH Performed at [facility], [place] [address] [place], [place] [address]    Report Status 06/07/2018 FINAL  Final  MRSA PCR Screening     Status: None   Collection Time: 06/06/18  6:01 PM  Result Value Ref Range Status   MRSA by PCR NEGATIVE NEGATIVE Final    Comment:        The GeneXpert MRSA Assay (FDA approved for NASAL specimens only), is one component of a comprehensive MRSA colonization surveillance program. It is not intended to diagnose MRSA infection  nor to guide or monitor treatment for MRSA infections. Performed at [facility], [place] [address], [place], [place] [address]      Scheduled Meds: . benztropine  0.5 mg Oral BID  .  buPROPion  450 mg Oral Daily  . clonazePAM  0.5 mg Oral BID  . feeding supplement  1 Container Oral TID BM  . Lurasidone HCl  60 mg Oral Daily  . mouth rinse  15 mL Mouth Rinse BID  . risperiDONE  3 mg Oral BID   Continuous Infusions: . sodium chloride Stopped (06/06/18 1948)  . lactated ringers 75 mL/hr at 06/11/18 1026     LOS: 5 days   Signature  Lala Lund M.D on 06/12/2018 at 9:35 AM   -  To page go to [email or web address] - password [facility]

## 2018-06-11 NOTE — Evaluation (Signed)
Occupational Therapy Evaluation Patient Details Name: Tara Hurst MRN: [id number] DOB: 04/29/1961 Today's Date: 06/11/2018    History of Present Illness 57yo F w/ a hx of HTN, CKD stage III, chronic anemia, chronic intermittent hypernatremia, generalized anxiety disorder, and schizoaffective disorder who was found altered by a Education officer, museum who went to perform a wellness check. Pt with AKI on CKD, hypernatremia, SIRS, CT head neg for acute event.     Clinical Impression   Pt admitted as above and presents with deficits in her ability to perform ADL and functional mobility/transfers (See OT problem list below). She is overall Min A/hand held assist however c/o feeling dizzy during session with co-tx PT and therefore requires +2 for safety/follow with chair. Pt should benefit from acute OT to assist in maximizing indepdence with ADL's and self care tasks. Recommend short term Rehab at SNF as pt lives alone.   Follow Up Recommendations  SNF;Supervision/Assistance - 24 hour    Equipment Recommendations  Other (comment)(Defer to next venue)    Recommendations for Other Services       Precautions / Restrictions Precautions Precautions: Fall Precaution Comments: pt denies falls but has scabs on both knees Restrictions Weight Bearing Restrictions: No      Mobility Bed Mobility Overal bed mobility: Needs Assistance Bed Mobility: Supine to Sit     Supine to sit: Supervision     General bed mobility comments: pt c/o dizziness in supine as well as sitting and keeps eyes closed unless cued  Transfers Overall transfer level: Needs assistance Equipment used: 1 person hand held assist Transfers: Sit to/from Stand Sit to Stand: Min assist         General transfer comment: min A to steady    Balance Overall balance assessment: Needs assistance Sitting-balance support: Single extremity supported Sitting balance-[name]: Fair     Standing balance support: Single extremity  supported Standing balance-[name]: Poor Standing balance comment: unsafe in standing without UE support at this time                           ADL either performed or assessed with clinical judgement   ADL Overall ADL's : Needs assistance/impaired Eating/Feeding: Sitting;Modified independent   Grooming: Set up;Sitting   Upper Body Bathing: Set up;Sitting   Lower Body Bathing: Minimal assistance;Sit to/from stand   Upper Body Dressing : Modified independent;Sitting   Lower Body Dressing: Min guard;Sit to/from stand   Toilet Transfer: Stand-pivot;BSC;Ambulation;Minimal assistance;Grab bars Armed forces technical officer Details (indicate cue type and reason): 1 hand held assist, Pt with c/o dizziness therefore may benefit from second person to follow with chair. Toileting- Clothing Manipulation and Hygiene: Minimal assistance;Sitting/lateral lean;Sit to/from stand       Functional mobility during ADLs: Minimal assistance;+2 for safety/equipment;Cueing for safety(+1 hand held assist, pt then c/o feeling dizzy and was unsafe, therefore +2 to follow w/ chair. Pt was not orthostatic - see vitals) General ADL Comments: Pt was seen for OT assessment followed by brief ADL retraining session with focus on functional mobility and simulated toilet transfers, ambulated with +1 hand held assist however, Pt w/ c/o feeling dizzy during session with co-tx PT and therefore requires +2 for safety/follow with chair. Pt should benefit from acute OT to assist in maximizing indepdence with ADL's and self care tasks. Recommend short term Rehab at SNF as pt lives alone.     Vision Baseline Vision/History: No visual deficits Patient Visual Report: No change from baseline  Perception     Praxis      Pertinent Vitals/Pain Pain Assessment: No/denies pain     Hand Dominance Right   Extremity/Trunk Assessment Upper Extremity Assessment Upper Extremity Assessment: Generalized weakness   Lower  Extremity Assessment Lower Extremity Assessment: Defer to PT evaluation   Cervical / Trunk Assessment Cervical / Trunk Assessment: Normal   Communication Communication Communication: No difficulties   Cognition Arousal/Alertness: Awake/alert Behavior During Therapy: Flat affect Overall Cognitive Status: No family/caregiver present to determine baseline cognitive functioning                                 General Comments: cannot give much detailed info, suspect STM deficits, no family present   General Comments  Pt lives alone, unclear of prior level of function and no family present. Pt denies h/o falls however bilateral knees had older appearing scabs as if from a fall. Cont to assess in functional context.    Exercises     Shoulder Instructions      Home Living Family/patient expects to be discharged to:: Private residence Living Arrangements: Alone Available Help at Discharge: Friend(s);Available PRN/intermittently Type of Home: Apartment Home Access: Level entry     Home Layout: One level     Bathroom Shower/Tub: Tub/shower unit         Home Equipment: None   Additional Comments: Pt provided some information but unsure of accuracy.      Prior Functioning/Environment Level of Independence: Independent        Comments: Pt states she is independent but has a friend that will take her to grocery store if she needs groceries or to appointments. Does not drive. Pt reports that she does own bill pay and housekeeping, has social worker whom was doing a Comptroller and found pt unresponsive prior to this admission.        OT Problem List: Decreased strength;Decreased activity tolerance;Decreased knowledge of use of DME or AE;Decreased knowledge of precautions;Decreased safety awareness;Impaired balance (sitting and/or standing)      OT Treatment/Interventions: Self-care/ADL training;DME and/or AE instruction;Therapeutic activities;Patient/family  education    OT Goals(Current goals can be found in the care plan section) Acute Rehab OT Goals Patient Stated Goal: Go home OT Goal Formulation: With patient Time For Goal Achievement: 06/25/18 Potential to Achieve Goals: Good  OT Frequency: Min 2X/week   Barriers to D/C:    Lives alone. Unable to provide clear history, no family present to assist.       Co-evaluation PT/OT/SLP Co-Evaluation/Treatment: Yes Reason for Co-Treatment: Necessary to address cognition/behavior during functional activity;For patient/therapist safety PT goals addressed during session: Mobility/safety with mobility;Balance OT goals addressed during session: ADL's and self-care      AM-PAC OT "6 Clicks" Daily Activity     Outcome Measure Help from another person eating meals?: None Help from another person taking care of personal grooming?: A Little Help from another person toileting, which includes using toliet, bedpan, or urinal?: A Little Help from another person bathing (including washing, rinsing, drying)?: A Little Help from another person to put on and taking off regular upper body clothing?: None Help from another person to put on and taking off regular lower body clothing?: A Little 6 Click Score: 20   End of Session Equipment Utilized During Treatment: Gait belt;Other (comment)(Hand held assist; second person to follow with chair for safety.) Nurse Communication: Mobility status;Precautions  Activity Tolerance: Treatment limited secondary to  medical complications (Comment);Patient tolerated treatment well;Other (comment)(Pt c/o feeling dizzy therefore transferred to chair for safety in hallway.) Patient left: in chair;with call bell/phone within reach;with chair alarm set  OT Visit Diagnosis: Unsteadiness on feet (R26.81);Muscle weakness (generalized) (M62.81);Other symptoms and signs involving cognitive function                Time: 7078-6754 OT Time Calculation (min): 32 min Charges:  OT  General Charges $OT Visit: 1 Visit OT Evaluation $OT Eval Moderate Complexity: 1 Mod OT Treatments $Self Care/Home Management : 8-22 mins   Barnhill, Amy Beth Dixon, OTR/L 06/11/2018, 10:54 AM

## 2018-06-12 LAB — BASIC METABOLIC PANEL
Anion gap: 4 — ABNORMAL LOW (ref 5–15)
BUN: 13 mg/dL (ref 6–20)
CO2: 25 mmol/L (ref 22–32)
Calcium: 9.2 mg/dL (ref 8.9–10.3)
Chloride: 116 mmol/L — ABNORMAL HIGH (ref 98–111)
Creatinine, Ser: 1.71 mg/dL — ABNORMAL HIGH (ref 0.44–1.00)
GFR calc non Af Amer: 33 mL/min — ABNORMAL LOW (ref >60)
GFR, EST AFRICAN AMERICAN: 38 mL/min — AB (ref >60)
Glucose, Bld: 103 mg/dL — ABNORMAL HIGH (ref 70–99)
Potassium: 4.2 mmol/L (ref 3.5–5.1)
Sodium: 145 mmol/L (ref 135–145)

## 2018-06-12 LAB — CBC
HCT: 27.8 % — ABNORMAL LOW (ref 36.0–46.0)
Hemoglobin: 8.7 g/dL — ABNORMAL LOW (ref 12.0–15.0)
MCH: 26.6 pg (ref 26.0–34.0)
MCHC: 31.3 g/dL (ref 30.0–36.0)
MCV: 85 fL (ref 80.0–100.0)
Platelets: 106 10*3/uL — ABNORMAL LOW (ref 150–400)
RBC: 3.27 MIL/uL — ABNORMAL LOW (ref 3.87–5.11)
RDW: 15.4 % (ref 11.5–15.5)
WBC: 8.7 10*3/uL (ref 4.0–10.5)
nRBC: 0 % (ref 0.0–0.2)

## 2018-06-12 LAB — MAGNESIUM: Magnesium: 2.3 mg/dL (ref 1.7–2.4)

## 2018-06-12 MED ORDER — SODIUM CHLORIDE 0.9 % IV BOLUS
1000.0000 mL | Freq: Once | INTRAVENOUS | Status: AC
  Administered 2018-06-12: 1000 mL via INTRAVENOUS

## 2018-06-12 MED ORDER — ENSURE ENLIVE PO LIQD
237.0000 mL | Freq: Two times a day (BID) | ORAL | Status: DC
  Administered 2018-06-12 – 2018-06-14 (×4): 237 mL via ORAL

## 2018-06-12 MED ORDER — MAGNESIUM SULFATE 2 GM/50ML IV SOLN: 2.0000 g | Freq: Once | INTRAVENOUS | Status: DC

## 2018-06-12 NOTE — Progress Notes (Signed)
Nutrition Follow-up  INTERVENTION:   - d/c Boost Breeze  - Ensure Enlive po BID, each supplement provides 350 kcal and 20 grams of protein (chocolate flavor)  - Encourage adequate PO intake  - Agree with Regular diet  NUTRITION DIAGNOSIS:   Moderate Malnutrition related to chronic illness (CKD stage III, schizoaffective disorder) as evidenced by mild fat depletion, mild muscle depletion, moderate muscle depletion, percent weight loss (14.9% weight loss in 6 months).  New diagnosis  GOAL:   Patient will meet greater than or equal to 90% of their needs  Progressing  MONITOR:   PO intake, Supplement acceptance, Labs, I & O's  REASON FOR ASSESSMENT:   Malnutrition Screening Tool    ASSESSMENT:   57 yo Tara Hurst with h/o schizoaffective disorder, anxiety, HTN, chronic benzodiazapine use, brought to ER via EMS for AMS.  History obtained from EMS and some from patient.  Per EMS social worker visited patient for wellness check, she was found undressed on bed/couch. Pt also with CKD stage III.  12/16 - diet advanced from clear liquids to Regular  Spoke with pt at bedside. Pt reports good appetite since admission, stating, "it's better than yesterday." Pt states she did well with breakfast meal and Boost Breeze this morning. Noted 100% completed breakfast meal tray and 100% completed Boost Breeze supplement at bedside.  Pt shares that her appetite was poor PTA because "they changed my diet." Pt states she was on "a meat diet" PTA. Pt unable to clarify or provide further information.  Pt shares that she would prefer Ensure Enlive over [employer]. RD to change order now that pt is no longer on clear liquids.  Pt denies any issues chewing or swallowing and denies nausea.  Pt states that she does not know if she has lost any weight. Per weight history in chart, pt with 10.5 kg weight loss since June 2019. This is a 14.9% weight loss in 6 months which is significant for  timeframe.  Wt Readings from Last 5 Encounters:  06/08/18 59.7 kg  12/11/17 70.3 kg  12/10/17 69.9 kg  12/04/17 68.7 kg  05/10/17 72.2 kg   Meal Completion: 100% at breakfast meal  Medications reviewed and include: Boost Breeze TID (pt accepting >90%)  Labs reviewed: hemoglobin 8.7 (L), creatinine 1.71 (H), lytes WNL  UOP: 2151 ml x 24 hours  NUTRITION - FOCUSED PHYSICAL EXAM:    Most Recent Value  Orbital Region  No depletion  Upper Arm Region  No depletion  Thoracic and Lumbar Region  Mild depletion  Buccal Region  Mild depletion  Temple Region  Mild depletion  Clavicle Bone Region  No depletion  Clavicle and Acromion Bone Region  Mild depletion  Scapular Bone Region  Mild depletion  Dorsal Hand  No depletion  Patellar Region  Mild depletion  Anterior Thigh Region  Moderate depletion  Posterior Calf Region  Moderate depletion  Edema (RD Assessment)  Mild [BLE]  Hair  Reviewed  Eyes  Reviewed  Mouth  Reviewed  Skin  Reviewed  Nails  Reviewed       Diet Order:   Diet Order            Diet regular Room service appropriate? Yes; Fluid consistency: Thin  Diet effective now              EDUCATION NEEDS:   No education needs have been identified at this time  Skin:  Skin Assessment: Reviewed RN Assessment  Last BM:  unknown/PTA  Height:   Ht Readings from Last 1 Encounters:  06/06/18 5\' 5"  (1.651 m)    Weight:   Wt Readings from Last 1 Encounters:  06/08/18 59.7 kg    Ideal Body Weight:  56.8 kg  BMI:  Body mass index is 21.9 kg/m.  Estimated Nutritional Needs:   Kcal:  5732-2025  Protein:  80-95 gm  Fluid:  1.7-1.9 L    Gaynell Face, MS, RD, LDN Inpatient Clinical Dietitian Pager: [phone] Weekend/After Hours: [phone]

## 2018-06-12 NOTE — Progress Notes (Signed)
Patient's pasrr has been identified as a level 2 and therefore patient will need to be seen by them in the hospital. [facility] Gardiner Ramus will try to assess patient's financial situation in the meantime.   Tara Hurst Tara Esqueda LCSW [phone]

## 2018-06-12 NOTE — Progress Notes (Signed)
[facility] TEAM 1 - Stepdown/ICU TEAM  Tara Hurst  ESP:[id number] DOB: 12-06-1960 DOA: 06/06/2018 PCP: [name], MD    Brief Narrative:  57yo F w/ a hx of HTN, CKD stage III, chronic anemia, chronic intermittent hypernatremia, generalized anxiety disorder, and schizoaffective disorder who was found altered by a social worker who went to perform a wellness check.  On presentation to the hospital she was unresponsive, afebrile, heart rate 122 bpm blood pressure was 115/94 mmHg. Lactic acid was 4.97, sodium 169, BUN 103, and creatinine 5.03. CT head without contrast did not show any acute intracranial pathology.    Subjective:  Patient in bed, appears comfortable, denies any headache, no fever, no chest pain or pressure, no shortness of breath , no abdominal pain. No focal weakness.   Assessment & Plan:  Acute anabolic encephalopathy -due to combination of dehydration, hypotension, uremia, ARF and hyponatremia.  Improved with IV fluids which will be continued, will B12, mentation close to baseline.  Home psych medications resumed.  ARF on CKD 3.  With hypernatremia.  Due to dehydration.Improved after IVF. Baseline creat 1.4.  SIRS -  No evidence of acute infection - now off abx - follow   Hypertension -was hypotensive, hold blood pressure medications continue hydration, blood pressure gradually improving.   Thrombocytopenia.  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  Schizoaffective disorder with anxiety.  Low-dose benzodiazepines resumed to avoid withdrawal, home psych medications resumed.  Currently not suicidal homicidal.  No active hallucinations or delusions.  Generalized weakness.  Seen by PT will require SNF.  Hypomagnesemia - replaced IV and stable.    DVT prophylaxis: SCDs (thrombocytopenia) Code Status: FULL CODE Family Communication: no family present at time of exam  Disposition Plan:  SNF  Consultants:  None   Antimicrobials:  None presently    Objective:  Blood pressure 113/79, pulse (!) 107, temperature 98.1 F (36.7 C), temperature source Oral, resp. rate 18, height 5\' 5"  (1.651 m), weight 59.7 kg, SpO2 100 %.  Intake/Output Summary (Last 24 hours) at 06/12/2018 0932 Last data filed at 06/12/2018 0520 Gross per 24 hour  Intake 1439.28 ml  Output 2151 ml  Net -711.72 ml   Filed Weights   06/06/18 1219 06/06/18 1700 06/08/18 0309  Weight: 70.3 kg 70.3 kg 59.7 kg    Examination:  Awake Alert, Oriented X 3, No new F.N deficits, Normal affect [place].AT,PERRAL Supple Neck,No JVD, No cervical lymphadenopathy appriciated.  Symmetrical Chest wall movement, Good air movement bilaterally, CTAB RRR,No Gallops, Rubs or new Murmurs, No Parasternal Heave +ve B.Sounds, Abd Soft, No tenderness, No organomegaly appriciated, No rebound - guarding or rigidity. No Cyanosis, Clubbing or edema, No new Rash or bruise   CBC: Recent Labs  Lab 06/08/18 0230 06/10/18 0218 06/11/18 0311 06/12/18 0336  WBC 8.6 5.7 7.5 8.7  NEUTROABS 6.1  --   --   --   HGB 9.0* 8.7* 8.6* 8.7*  HCT 29.7* 29.5* 26.8* 27.8*  MCV 85.8 87.3 85.1 85.0  PLT 119* 97* 94* 633*   Basic Metabolic Panel: Recent Labs  Lab 06/09/18 1824 06/10/18 0218 06/11/18 0311 06/12/18 0336  NA 148* 151* 144 145  K 4.1 3.9 3.0* 4.2  CL 119* 122* 111 116*  CO2 21* 22 25 25   GLUCOSE 111* 116* 125* 103*  BUN 17 14 10 13   CREATININE 1.90* 1.95* 1.91* 1.71*  CALCIUM 8.8* 9.3 8.6* 9.2  MG  --  1.9 1.6* 2.3  PHOS 1.5* 2.8 2.6  --  GFR: Estimated Creatinine Clearance: 32.7 mL/min (A) (by C-G formula based on SCr of 1.71 mg/dL (H)).  Liver Function Tests: Recent Labs  Lab 06/06/18 1322 06/07/18 0453  06/09/18 0920 06/09/18 1824 06/10/18 0218 06/11/18 0311  AST 23 20  --   --   --   --  17  ALT 19 13  --   --   --   --  17  ALKPHOS 97 63  --   --   --   --  50  BILITOT 2.0* 1.3*  --   --   --   --  0.7  PROT 9.9* 6.1*  --   --   --   --  5.0*  ALBUMIN 5.8*  3.8   < > 2.9* 2.8* 2.8* 2.8*   < > = values in this interval not displayed.    Recent Labs  Lab 06/07/18 1759 06/11/18 0311  AMMONIA 45* 47*    Cardiac Enzymes: Recent Labs  Lab 06/06/18 2138 06/08/18 0230  CKTOTAL 76 54   CBG: Recent Labs  Lab 06/06/18 1227 06/07/18 1753  GLUCAP 126* 138*    Recent Results (from the past 240 hour(s))  Blood Culture (routine x 2)     Status: None   Collection Time: 06/06/18  2:53 PM  Result Value Ref Range Status   Specimen Description SITE NOT SPECIFIED  Final   Special Requests   Final    BOTTLES DRAWN AEROBIC ONLY Blood Culture results may not be optimal due to an inadequate volume of blood received in culture bottles   Culture   Final    NO GROWTH 5 DAYS Performed at [facility], [place] [address] [place], [place] [address]    Report Status 06/11/2018 FINAL  Final  Blood Culture (routine x 2)     Status: None   Collection Time: 06/06/18  3:10 PM  Result Value Ref Range Status   Specimen Description BLOOD RIGHT ARM UPPER  Final   Special Requests   Final    BOTTLES DRAWN AEROBIC AND ANAEROBIC Blood Culture adequate volume   Culture   Final    NO GROWTH 5 DAYS Performed at [facility], [place] [address], [place], [place] [address]    Report Status 06/11/2018 FINAL  Final  Urine culture     Status: None   Collection Time: 06/06/18  3:42 PM  Result Value Ref Range Status   Specimen Description URINE, CATHETERIZED  Final   Special Requests NONE  Final   Culture   Final    NO GROWTH Performed at [facility], [place] [address] [place], [place] [address]    Report Status 06/07/2018 FINAL  Final  MRSA PCR Screening     Status: None   Collection Time: 06/06/18  6:01 PM  Result Value Ref Range Status   MRSA by PCR NEGATIVE NEGATIVE Final    Comment:        The GeneXpert MRSA Assay (FDA approved for NASAL specimens only), is one component of a comprehensive MRSA colonization surveillance program. It is  not intended to diagnose MRSA infection nor to guide or monitor treatment for MRSA infections. Performed at [facility], [place] [address], [place],  [address]      Scheduled Meds: . benztropine  0.5 mg Oral BID  . buPROPion  450 mg Oral Daily  . clonazePAM  0.5 mg Oral BID  . feeding supplement  1 Container Oral TID  BM  . mouth rinse  15 mL Mouth Rinse BID  . risperiDONE  3 mg Oral BID   Continuous Infusions: . sodium chloride Stopped (06/06/18 1948)  . sodium chloride 1,000 mL (06/12/18 0856)     LOS: 6 days   Signature  Lala Lund M.D on 06/12/2018 at 9:33 AM   -  To page go to [email or web address] - password [facility]

## 2018-06-13 MED ORDER — POLYETHYLENE GLYCOL 3350 17 G PO PACK: 17.0000 g | PACK | Freq: Every day | ORAL | 0 refills | Status: DC

## 2018-06-13 MED ORDER — BISACODYL 5 MG PO TBEC: 10.0000 mg | DELAYED_RELEASE_TABLET | Freq: Once | ORAL | Status: DC

## 2018-06-13 MED ORDER — PROPRANOLOL HCL 10 MG PO TABS
10.0000 mg | ORAL_TABLET | Freq: Two times a day (BID) | ORAL | Status: DC
  Administered 2018-06-13 – 2018-06-14 (×3): 10 mg via ORAL
  Filled 2018-06-13 (×4): qty 1

## 2018-06-13 MED ORDER — MAGNESIUM HYDROXIDE 400 MG/5ML PO SUSP
30.0000 mL | Freq: Two times a day (BID) | ORAL | Status: AC
  Administered 2018-06-13 (×2): 30 mL via ORAL
  Filled 2018-06-13 (×2): qty 30

## 2018-06-13 MED ORDER — CLONAZEPAM 0.5 MG PO TABS: 0.5000 mg | ORAL_TABLET | Freq: Two times a day (BID) | ORAL | 0 refills | Status: DC

## 2018-06-13 MED ORDER — BISACODYL 10 MG RE SUPP
10.0000 mg | Freq: Once | RECTAL | Status: AC
  Administered 2018-06-13: 10 mg via RECTAL
  Filled 2018-06-13: qty 1

## 2018-06-13 NOTE — Discharge Summary (Addendum)
Tara Hurst QAE:[id number] DOB: 01-Jan-1961 DOA: 06/06/2018  PCP: [name], MD  Admit date: 06/06/2018  Discharge date: 06/13/2018  Admitted From: Home   Disposition:  SNF  Discharge summary updated 06/14/2018 at 11:04 AM -No significant updates on discharge summary, or medication list, patient was not discharged 06/03/2018 as she was waiting for passar, which is currently obtained, patient will be discharged.  Recommendations for Outpatient Follow-up:   Follow up with PCP in 1-2 weeks  PCP Please obtain BMP/CBC, 2 view CXR in 1week,  (see Discharge instructions)   PCP Please follow up on the following pending results:    Home Health: None   Equipment/Devices: None  Consultations: None Discharge Condition: Stable   CODE STATUS: Full   Diet Recommendation: Heart Healthy      Chief Complaint  Patient presents with  . Altered Mental Status     Brief history of present illness from the day of admission and additional interim summary    57yo F w/ a hx of HTN,CKD stage III, chronic anemia, chronic intermittent hypernatremia,generalized anxiety disorder, and schizoaffective disorder who was found altered by a social workerwho went to perform a wellness check.  On presentation to the hospital she was unresponsive, afebrile, heart rate 122 bpm blood pressure was 115/80mmHg. Lactic acid was 4.97, sodium169, BUN 103, and creatinine 5.03. CT head without contrast did not show any acute intracranial pathology.                                                                 Hospital Course   Acute anabolic encephalopathy -due to combination of dehydration, hypotension, uremia, ARF and hyponatremia.  Improved with IV fluids which will be continued, will B12, mentation close to baseline.  Home psych  medications resumed.  ARF on CKD 3.  With hypernatremia.  Due to dehydration.Improved after IVF. Baseline creat 1.4.  Continue to hold ARB, check BMP again at SNF in a week.  SIRS -  No evidence of acute infection - now off abx - follow   Hypertension -was hypotensive, hydration blood pressure is improved home dose beta-blocker resumed.   Thrombocytopenia.  - mild, HIT -ve, monitor, check CBC at SNF in a week no bleeding or acute issues.  Schizoaffective disorder with anxiety.  Low-dose benzodiazepines resumed to avoid withdrawal, home psych medications resumed.  Currently not suicidal homicidal.  No active hallucinations or delusions.  Generalized weakness.  Seen by PT will require SNF.  Hypomagnesemia - replaced IV and stable.     Discharge diagnosis     Principal Problem:   Hypernatremia Active Problems:   Hypertension   Acute encephalopathy   Tachycardia   AKI (acute kidney injury) ([place])   Sepsis due to undetermined organism [facility])    Discharge instructions    Discharge Instructions  Diet - low sodium heart healthy   Complete by:  As directed    Discharge instructions   Complete by:  As directed    Follow with Primary MD [name], MD in 7 days   Get CBC, CMP, 2 view Chest X ray -  checked  by Primary MD or SNF MD in 5-7 days    Activity: As tolerated with Full fall precautions use walker/cane & assistance as needed  Disposition SNF  Diet: Heart Healthy    Special Instructions: If you have smoked or chewed Tobacco  in the last 2 yrs please stop smoking, stop any regular Alcohol  and or any Recreational drug use.  On your next visit with your primary care physician please Get Medicines reviewed and adjusted.  Please request your Prim.MD to go over all Hospital Tests and Procedure/Radiological results at the follow up, please get all Hospital records sent to your Prim MD by signing hospital release before you go home.  If you experience  worsening of your admission symptoms, develop shortness of breath, life threatening emergency, suicidal or homicidal thoughts you must seek medical attention immediately by calling 911 or calling your MD immediately  if symptoms less severe.  You Must read complete instructions/literature along with all the possible adverse reactions/side effects for all the Medicines you take and that have been prescribed to you. Take any new Medicines after you have completely understood and accpet all the possible adverse reactions/side effects.   Increase activity slowly   Complete by:  As directed       Discharge Medications   Allergies as of 06/13/2018   No Active Allergies     Medication List    STOP taking these medications   LORazepam 1 MG tablet Commonly known as:  ATIVAN   losartan 50 MG tablet Commonly known as:  COZAAR   potassium chloride SA 20 MEQ tablet Commonly known as:  K-DUR,KLOR-CON     TAKE these medications   benztropine 0.5 MG tablet Commonly known as:  COGENTIN Take 1 tablet (0.5 mg total) by mouth 2 (two) times daily.   buPROPion 150 MG 24 hr tablet Commonly known as:  WELLBUTRIN XL Take 450 mg by mouth daily.   clonazePAM 0.5 MG tablet Commonly known as:  KLONOPIN Take 1 tablet (0.5 mg total) by mouth 2 (two) times daily.   LATUDA 60 MG Tabs Generic drug:  Lurasidone HCl Take 60 mg by mouth daily.   polyethylene glycol packet Commonly known as:  MIRALAX Take 17 g by mouth daily.   propranolol 10 MG tablet Commonly known as:  INDERAL Take 10 mg by mouth 2 (two) times daily.   risperiDONE 3 MG tablet Commonly known as:  RISPERDAL Take 1 tablet (3 mg total) by mouth 2 (two) times daily.       Follow-up Information    [name], MD. Schedule an appointment as soon as possible for a visit in 1 week(s).   Specialty:  Pulmonary Disease Why:  and with your psychiatrist within a week Contact information: [place]  [address] [phone]           Major procedures and Radiology Reports - PLEASE review detailed and final reports thoroughly  -         Dg Chest 2 View  Result Date: 06/06/2018 CLINICAL DATA:  Altered mental status. EXAM: CHEST - 2 VIEW COMPARISON:  CT chest and chest x-ray dated October 18, 2017. FINDINGS: The heart size  and mediastinal contours are within normal limits. Normal pulmonary vascularity. No focal consolidation, pleural effusion, or pneumothorax. No acute osseous abnormality. Foot IMPRESSION: No active cardiopulmonary disease. Electronically Signed   By: Titus Dubin M.D.   On: 06/06/2018 13:04   Ct Head Wo Contrast  Result Date: 06/06/2018 CLINICAL DATA:  Altered mental status EXAM: CT HEAD WITHOUT CONTRAST TECHNIQUE: Contiguous axial images were obtained from the base of the skull through the vertex without intravenous contrast. COMPARISON:  12/03/2017 FINDINGS: Brain: No evidence of acute infarction, hemorrhage, hydrocephalus, extra-axial collection or mass lesion/mass effect. Vascular: No hyperdense vessel or unexpected calcification. Skull: Normal. Negative for fracture or focal lesion. Sinuses/Orbits: No acute finding. Other: None. IMPRESSION: No acute intracranial abnormality noted. Electronically Signed   By: Inez Catalina M.D.   On: 06/06/2018 13:37   Dg Chest Port 1 View  Result Date: 06/08/2018 CLINICAL DATA:  Leukocytosis. EXAM: PORTABLE CHEST 1 VIEW COMPARISON:  Radiographs of June 06, 2018. FINDINGS: The heart size and mediastinal contours are within normal limits. Both lungs are clear. The visualized skeletal structures are unremarkable. IMPRESSION: No active disease. Electronically Signed   By: Marijo Conception, M.D.   On: 06/08/2018 09:09    Micro Results     Recent Results (from the past 240 hour(s))  Blood Culture (routine x 2)     Status: None   Collection Time: 06/06/18  2:53 PM  Result Value Ref Range Status   Specimen Description SITE NOT  SPECIFIED  Final   Special Requests   Final    BOTTLES DRAWN AEROBIC ONLY Blood Culture results may not be optimal due to an inadequate volume of blood received in culture bottles   Culture   Final    NO GROWTH 5 DAYS Performed at [facility], [place] [address] [place], [place] [address]    Report Status 06/11/2018 FINAL  Final  Blood Culture (routine x 2)     Status: None   Collection Time: 06/06/18  3:10 PM  Result Value Ref Range Status   Specimen Description BLOOD RIGHT ARM UPPER  Final   Special Requests   Final    BOTTLES DRAWN AEROBIC AND ANAEROBIC Blood Culture adequate volume   Culture   Final    NO GROWTH 5 DAYS Performed at [facility], [place] [address], [place], [place] [address]    Report Status 06/11/2018 FINAL  Final  Urine culture     Status: None   Collection Time: 06/06/18  3:42 PM  Result Value Ref Range Status   Specimen Description URINE, CATHETERIZED  Final   Special Requests NONE  Final   Culture   Final    NO GROWTH Performed at [facility], [place] [address] [place], [place] [address]    Report Status 06/07/2018 FINAL  Final  MRSA PCR Screening     Status: None   Collection Time: 06/06/18  6:01 PM  Result Value Ref Range Status   MRSA by PCR NEGATIVE NEGATIVE Final    Comment:        The GeneXpert MRSA Assay (FDA approved for NASAL specimens only), is one component of a comprehensive MRSA colonization surveillance program. It is not intended to diagnose MRSA infection nor to guide or monitor treatment for MRSA infections. Performed at [facility], [place] [address] [place], [place] [address]     Today   Subjective    Tara Hurst today has no headache,no chest abdominal pain,no new  weakness tingling or numbness, feels much better wants to go home today.    Objective   Blood pressure 126/79, pulse 98, temperature 98.2 F (36.8 C), temperature source Oral, resp. rate 19, height 5\' 5"  (1.651 m), weight 59.7 kg, SpO2 100  %.   Intake/Output Summary (Last 24 hours) at 06/13/2018 0830 Last data filed at 06/13/2018 0300 Gross per 24 hour  Intake 240 ml  Output 2300 ml  Net -2060 ml    Exam Awake Alert, Oriented x 3, No new F.N deficits, Normal affect [place].AT,PERRAL Supple Neck,No JVD, No cervical lymphadenopathy appriciated.  Symmetrical Chest wall movement, Good air movement bilaterally, CTAB RRR,No Gallops,Rubs or new Murmurs, No Parasternal Heave +ve B.Sounds, Abd Soft, Non tender, No organomegaly appriciated, No rebound -guarding or rigidity. No Cyanosis, Clubbing or edema, No new Rash or bruise   Data Review   CBC w Diff:  Lab Results  Component Value Date   WBC 8.7 06/12/2018   HGB 8.7 (L) 06/12/2018   HCT 27.8 (L) 06/12/2018   PLT 106 (L) 06/12/2018   LYMPHOPCT 17 06/08/2018   MONOPCT 9 06/08/2018   EOSPCT 3 06/08/2018   BASOPCT 0 06/08/2018    CMP:  Lab Results  Component Value Date   NA 145 06/12/2018   K 4.2 06/12/2018   CL 116 (H) 06/12/2018   CO2 25 06/12/2018   BUN 13 06/12/2018   CREATININE 1.71 (H) 06/12/2018   PROT 5.0 (L) 06/11/2018   ALBUMIN 2.8 (L) 06/11/2018   BILITOT 0.7 06/11/2018   ALKPHOS 50 06/11/2018   AST 17 06/11/2018   ALT 17 06/11/2018  .   Total Time in preparing paper work, data evaluation and todays exam - 50 minutes  Lala Lund M.D on 06/13/2018 at 8:30 AM  [facility]   Office  [phone]

## 2018-06-13 NOTE — Progress Notes (Signed)
Pasrr still under level II, in-person review. [facility] aware and will accept patient once pasrr comes through.   Tara Locus Izzak Fries LCSW [phone]

## 2018-06-13 NOTE — Progress Notes (Signed)
Occupational Therapy Treatment Patient Details Name: Tara Hurst MRN: [id number] DOB: 1961-05-10 Today's Date: 06/13/2018    History of present illness 58yo F w/ a hx of HTN, CKD stage III, chronic anemia, chronic intermittent hypernatremia, generalized anxiety disorder, and schizoaffective disorder who was found altered by a Education officer, museum who went to perform a wellness check. Pt with AKI on CKD, hypernatremia, SIRS, CT head neg for acute event.     OT comments  Pt requiring increased assist today with balance.  She fell backwards on bed x 2 when attempting to don socks, and then pitched forward heavily when leaning forward to attempt to pull socks over ankles.  She requires up to mod A for balance and to guide RW to walk to bathroom and to brush teeth.  She required mod cues for sequencing and problem solving when brushing teeth.  She is unsafe to return home independently - discussed this with her.  Recommend SNF level rehab.   Follow Up Recommendations  SNF;Supervision/Assistance - 24 hour    Equipment Recommendations  None recommended by OT    Recommendations for Other Services      Precautions / Restrictions Precautions Precautions: Fall Precaution Comments: heavy posterior lean, and impulsive  Restrictions Weight Bearing Restrictions: No       Mobility Bed Mobility Overal bed mobility: Needs Assistance Bed Mobility: Supine to Sit;Sit to Supine     Supine to sit: Min guard Sit to supine: Min guard   General bed mobility comments: assist for safety   Transfers Overall transfer level: Needs assistance Equipment used: Rolling walker (2 wheeled) Transfers: Sit to/from [facility] Sit to Stand: Min assist Stand pivot transfers: Min assist;Mod assist       General transfer comment: assist for balance, and to guide RW     Balance Overall balance assessment: Needs assistance Sitting-balance support: Feet supported;No upper extremity  supported Sitting balance-[name]: Poor Sitting balance - Comments: loss of balance x 2 posteriorly, and pitched heavily forward requiring mod A to prevent LOB    Standing balance support: During functional activity Standing balance-[name]: Poor Standing balance comment: requires UE support and up to mod A due to imbalance                            ADL either performed or assessed with clinical judgement   ADL Overall ADL's : Needs assistance/impaired     Grooming: Oral care;Wash/dry hands;Minimal assistance;Standing Grooming Details (indicate cue type and reason): requires mod cues for sequencing and min A for standing balance              Lower Body Dressing: Moderate assistance;Sit to/from stand Lower Body Dressing Details (indicate cue type and reason): pt fell backwards x2 when attempting to don socks, then pitched forward hard when attempting to lean forward to access feet and required mod A to prevent LOB and fall.   Toilet Transfer: Moderate assistance;Ambulation;Comfort height toilet Toilet Transfer Details (indicate cue type and reason): Pt unsteady, requires up to mod A to to imbalance and occasional posterior LOB  Toileting- Clothing Manipulation and Hygiene: Moderate assistance;Sit to/from stand Toileting - Clothing Manipulation Details (indicate cue type and reason): for balance      Functional mobility during ADLs: Minimal assistance;Moderate assistance       Vision       Perception     Praxis      Cognition Arousal/Alertness: Awake/alert Behavior During Therapy:  Flat affect Overall Cognitive Status: No family/caregiver present to determine baseline cognitive functioning                                 General Comments: Pt is not oriented to situation.  She follows one step commands consistently.  She demonstrates poor awareness of deficits, and requires cues for sequencing and problem solving         Exercises      Shoulder Instructions       General Comments HR into 140s with pt standing at sink to brush teeth (RN aware).  Long discussion with pt re: current deficits and need for SNF level rehab     Pertinent Vitals/ Pain       Pain Assessment: No/denies pain  Home Living                                          Prior Functioning/Environment              Frequency  Min 2X/week        Progress Toward Goals  OT Goals(current goals can now be found in the care plan section)  Progress towards OT goals: Not progressing toward goals - comment     Plan Discharge plan remains appropriate    Co-evaluation                 AM-PAC OT "6 Clicks" Daily Activity     Outcome Measure   Help from another person eating meals?: None Help from another person taking care of personal grooming?: A Little Help from another person toileting, which includes using toliet, bedpan, or urinal?: A Lot Help from another person bathing (including washing, rinsing, drying)?: A Lot Help from another person to put on and taking off regular upper body clothing?: A Little Help from another person to put on and taking off regular lower body clothing?: A Lot 6 Click Score: 16    End of Session Equipment Utilized During Treatment: Rolling walker  OT Visit Diagnosis: Unsteadiness on feet (R26.81);Muscle weakness (generalized) (M62.81);Other symptoms and signs involving cognitive function   Activity Tolerance Patient limited by fatigue   Patient Left in bed;with call bell/phone within reach;with bed alarm set;with nursing/sitter in room   Nurse Communication Mobility status        Time: 0945-1006 OT Time Calculation (min): 21 min  Charges: OT General Charges $OT Visit: 1 Visit OT Treatments $Self Care/Home Management : 8-22 mins  Lucille Passy, OTR/L [place] Pager [phone] Office [phone]    Lucille Passy M 06/13/2018, 10:45 AM

## 2018-06-13 NOTE — Progress Notes (Signed)
Physical Therapy Treatment Patient Details Name: Tara Hurst MRN: [id number] DOB: 13-Jun-1961 Today's Date: 06/13/2018    History of Present Illness 57yo F w/ a hx of HTN, CKD stage III, chronic anemia, chronic intermittent hypernatremia, generalized anxiety disorder, and schizoaffective disorder who was found altered by a Education officer, museum who went to perform a wellness check. Pt with AKI on CKD, hypernatremia, SIRS, CT head neg for acute event.      PT Comments    Patient progressing with ambulation distance/endurance, though still needs a lot of help to manage walker on turns and through doorways.  Patient incontinent in bed, assist for hygiene.  Continue to recommend SNF level rehab at d/c.  Follow Up Recommendations  SNF;Supervision/Assistance - 24 hour     Equipment Recommendations  Other (comment)(TBA)    Recommendations for Other Services       Precautions / Restrictions Precautions Precautions: Fall    Mobility  Bed Mobility Overal bed mobility: Needs Assistance Bed Mobility: Sidelying to Sit   Sidelying to sit: Min assist;HOB elevated   Sit to supine: Supervision   General bed mobility comments: slow to lift trunk, encouragement to keep pushing up and assist needed  Transfers Overall transfer level: Needs assistance Equipment used: Rolling walker (2 wheeled) Transfers: Sit to/from Stand Sit to Stand: Min assist;Min guard         General transfer comment: mostly minguard for balance/safety  Ambulation/Gait Ambulation/Gait assistance: Min assist;Mod assist Gait Distance (Feet): 80 Feet Assistive device: Rolling walker (2 wheeled) Gait Pattern/deviations: Step-to pattern;Step-through pattern;Shuffle;Narrow base of support;Decreased dorsiflexion - left;Decreased dorsiflexion - right     General Gait Details: occasional knee buckling; at times increased help for safe walker management or due to LOB with knee buckling   Stairs             Wheelchair  Mobility    Modified Rankin (Stroke Patients Only)       Balance Overall balance assessment: Needs assistance Sitting-balance support: Feet supported Sitting balance-[name]: Poor Sitting balance - Comments: leaning down on elbow in sitting EOB   Standing balance support: No upper extremity supported;During functional activity;Single extremity supported Standing balance-[name]: Poor Standing balance comment: min A and pt holding grabbar in bathroom while standing for hygiene, and to don gown                            Cognition Arousal/Alertness: Awake/alert Behavior During Therapy: Flat affect Overall Cognitive Status: No family/caregiver present to determine baseline cognitive functioning                                 General Comments: decreased awareness, soiled with BM in bed, decreased STM, recalling incorrectly her lunch meal      Exercises      General Comments General comments (skin integrity, edema, etc.): soiled in bed due to incontinent of stool, toileted in bathroom and assist for hygiene, and to change bed linen as food in bed and pt not attending to it.      Pertinent Vitals/Pain Pain Assessment: No/denies pain    Home Living                      Prior Function            PT Goals (current goals can now be found in the care plan section) Progress  towards PT goals: Progressing toward goals    Frequency    Min 2X/week      PT Plan Current plan remains appropriate    Co-evaluation              AM-PAC PT "6 Clicks" Mobility   Outcome Measure  Help needed turning from your back to your side while in a flat bed without using bedrails?: A Little Help needed moving from lying on your back to sitting on the side of a flat bed without using bedrails?: A Little Help needed moving to and from a bed to a chair (including a wheelchair)?: A Little Help needed standing up from a chair using your arms  (e.g., wheelchair or bedside chair)?: A Little Help needed to walk in hospital room?: A Little Help needed climbing 3-5 steps with a railing? : A Lot 6 Click Score: 17    End of Session Equipment Utilized During Treatment: Gait belt Activity Tolerance: Patient limited by fatigue Patient left: with call bell/phone within reach;in bed Nurse Communication: Mobility status PT Visit Diagnosis: Other abnormalities of gait and mobility (R26.89);Muscle weakness (generalized) (M62.81)     Time: 3267-1245 PT Time Calculation (min) (ACUTE ONLY): 29 min  Charges:  $Gait Training: 8-22 mins $Therapeutic Activity: 8-22 mins                     Tara Hurst, Tara Hurst [phone] 06/13/2018    Tara Hurst 06/13/2018, 4:50 PM

## 2018-06-13 NOTE — Discharge Instructions (Signed)
Follow with Primary MD [name], MD in 7 days   Get CBC, CMP, 2 view Chest X ray -  checked  by Primary MD or SNF MD in 5-7 days    Activity: As tolerated with Full fall precautions use walker/cane & assistance as needed  Disposition SNF  Diet: Heart Healthy    Special Instructions: If you have smoked or chewed Tobacco  in the last 2 yrs please stop smoking, stop any regular Alcohol  and or any Recreational drug use.  On your next visit with your primary care physician please Get Medicines reviewed and adjusted.  Please request your Prim.MD to go over all Hospital Tests and Procedure/Radiological results at the follow up, please get all Hospital records sent to your Prim MD by signing hospital release before you go home.  If you experience worsening of your admission symptoms, develop shortness of breath, life threatening emergency, suicidal or homicidal thoughts you must seek medical attention immediately by calling 911 or calling your MD immediately  if symptoms less severe.  You Must read complete instructions/literature along with all the possible adverse reactions/side effects for all the Medicines you take and that have been prescribed to you. Take any new Medicines after you have completely understood and accpet all the possible adverse reactions/side effects.

## 2018-06-14 DIAGNOSIS — N179 Acute kidney failure, unspecified: Secondary | ICD-10-CM

## 2018-06-14 DIAGNOSIS — G9340 Encephalopathy, unspecified: Secondary | ICD-10-CM

## 2018-06-14 DIAGNOSIS — E440 Moderate protein-calorie malnutrition: Secondary | ICD-10-CM

## 2018-06-14 NOTE — Plan of Care (Signed)

## 2018-06-14 NOTE — Progress Notes (Signed)
Patient will DC to: [place] Anticipated DC date: 06/14/18 Family notified: [employer] Social Worker, Olivia Mackie [phone]) Transport by: Corey Harold   Per MD patient ready for DC to [facility]. RN, patient, patient's family, and facility notified of DC. Discharge Summary and FL2 sent to facility. RN to call report prior to discharge ([phone] Room 109B). DC packet on chart. Ambulance transport requested for patient.   CSW will sign off for now as social work intervention is no longer needed. Please consult [place] again if new needs arise.  Cedric Fishman, LCSW Clinical Social Worker [phone]

## 2018-06-14 NOTE — Progress Notes (Signed)
[place] TEAM 1 - Stepdown/ICU TEAM  Tara Hurst  HGD:[id number] DOB: 02-Oct-1960 DOA: 06/06/2018 PCP: [name], MD    Brief Narrative:  57yo F w/ a hx of HTN, CKD stage III, chronic anemia, chronic intermittent hypernatremia, generalized anxiety disorder, and schizoaffective disorder who was found altered by a social worker who went to perform a wellness check.  On presentation to the hospital she was unresponsive, afebrile, heart rate 122 bpm blood pressure was 115/94 mmHg. Lactic acid was 4.97, sodium 169, BUN 103, and creatinine 5.03. CT head without contrast did not show any acute intracranial pathology.    Subjective:  Patient in bed, appears comfortable, denies any headache, no fever, no chest pain or pressure, no shortness of breath , no abdominal pain. No focal weakness.   Assessment & Plan:  Acute anabolic encephalopathy -due to combination of dehydration, hypotension, uremia, ARF and hyponatremia.  Improved with IV fluids ,mentation close to baseline. ARF on CKD 3.  With hypernatremia.  Due to dehydration.Improved after IVF. Baseline creat 1.4.  SIRS -  No evidence of acute infection - now off abx - follow   Hypertension -was hypotensive, blood pressure has improved, back on her home meds    Thrombocytopenia.  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  Schizoaffective disorder with anxiety.  Low-dose benzodiazepines resumed to avoid withdrawal, home psych medications resumed.  Currently not suicidal homicidal.  No active hallucinations or delusions.  Generalized weakness.  Seen by PT will require SNF.  Hypomagnesemia - replaced IV and stable.  Protein calorie malnutrition-nutritionist consult appreciated    DVT prophylaxis: SCDs (thrombocytopenia) Code Status: FULL CODE Family Communication: no family present at time of exam  Disposition Plan:  SNF when pathology is available  Consultants:  None   Antimicrobials:  None presently   Objective:  Blood pressure  108/82, pulse 75, temperature 98.8 F (37.1 C), temperature source Oral, resp. rate 18, height 5\' 5"  (1.651 m), weight 59.7 kg, SpO2 97 %.  Intake/Output Summary (Last 24 hours) at 06/14/2018 0955 Last data filed at 06/13/2018 2200 Gross per 24 hour  Intake 1080 ml  Output -  Net 1080 ml   Filed Weights   06/06/18 1219 06/06/18 1700 06/08/18 0309  Weight: 70.3 kg 70.3 kg 59.7 kg    Examination:  Awake Alert, Oriented X 3, No new F.N deficits, Normal affect Symmetrical Chest wall movement, Good air movement bilaterally, CTAB RRR,No Gallops,Rubs or new Murmurs, No Parasternal Heave +ve B.Sounds, Abd Soft, No tenderness, No rebound - guarding or rigidity. No Cyanosis, Clubbing or edema, No new Rash or bruise      CBC: Recent Labs  Lab 06/08/18 0230 06/10/18 0218 06/11/18 0311 06/12/18 0336  WBC 8.6 5.7 7.5 8.7  NEUTROABS 6.1  --   --   --   HGB 9.0* 8.7* 8.6* 8.7*  HCT 29.7* 29.5* 26.8* 27.8*  MCV 85.8 87.3 85.1 85.0  PLT 119* 97* 94* 962*   Basic Metabolic Panel: Recent Labs  Lab 06/09/18 1824 06/10/18 0218 06/11/18 0311 06/12/18 0336  NA 148* 151* 144 145  K 4.1 3.9 3.0* 4.2  CL 119* 122* 111 116*  CO2 21* 22 25 25   GLUCOSE 111* 116* 125* 103*  BUN 17 14 10 13   CREATININE 1.90* 1.95* 1.91* 1.71*  CALCIUM 8.8* 9.3 8.6* 9.2  MG  --  1.9 1.6* 2.3  PHOS 1.5* 2.8 2.6  --    GFR: Estimated Creatinine Clearance: 32.7 mL/min (A) (by C-G formula based on SCr  of 1.71 mg/dL (H)).  Liver Function Tests: Recent Labs  Lab 06/09/18 0920 06/09/18 1824 06/10/18 0218 06/11/18 0311  AST  --   --   --  17  ALT  --   --   --  17  ALKPHOS  --   --   --  50  BILITOT  --   --   --  0.7  PROT  --   --   --  5.0*  ALBUMIN 2.9* 2.8* 2.8* 2.8*    Recent Labs  Lab 06/07/18 1759 06/11/18 0311  AMMONIA 45* 47*    Cardiac Enzymes: Recent Labs  Lab 06/08/18 0230  CKTOTAL 54   CBG: Recent Labs  Lab 06/07/18 1753  GLUCAP 138*    Recent Results (from the past  240 hour(s))  Blood Culture (routine x 2)     Status: None   Collection Time: 06/06/18  2:53 PM  Result Value Ref Range Status   Specimen Description SITE NOT SPECIFIED  Final   Special Requests   Final    BOTTLES DRAWN AEROBIC ONLY Blood Culture results may not be optimal due to an inadequate volume of blood received in culture bottles   Culture   Final    NO GROWTH 5 DAYS Performed at [facility], [place] [address], [place], [place] [address]    Report Status 06/11/2018 FINAL  Final  Blood Culture (routine x 2)     Status: None   Collection Time: 06/06/18  3:10 PM  Result Value Ref Range Status   Specimen Description BLOOD RIGHT ARM UPPER  Final   Special Requests   Final    BOTTLES DRAWN AEROBIC AND ANAEROBIC Blood Culture adequate volume   Culture   Final    NO GROWTH 5 DAYS Performed at [facility], [place] [address] [place], [place] [address]    Report Status 06/11/2018 FINAL  Final  Urine culture     Status: None   Collection Time: 06/06/18  3:42 PM  Result Value Ref Range Status   Specimen Description URINE, CATHETERIZED  Final   Special Requests NONE  Final   Culture   Final    NO GROWTH Performed at [facility], [place] [address] [place], [place] [address]    Report Status 06/07/2018 FINAL  Final  MRSA PCR Screening     Status: None   Collection Time: 06/06/18  6:01 PM  Result Value Ref Range Status   MRSA by PCR NEGATIVE NEGATIVE Final    Comment:        The GeneXpert MRSA Assay (FDA approved for NASAL specimens only), is one component of a comprehensive MRSA colonization surveillance program. It is not intended to diagnose MRSA infection nor to guide or monitor treatment for MRSA infections. Performed at [facility], [place] [address] [place], [place] [address]      Scheduled Meds: . benztropine  0.5 mg Oral BID  . buPROPion  450 mg Oral Daily  . clonazePAM  0.5 mg Oral BID  . feeding supplement (ENSURE ENLIVE)  237 mL Oral BID BM    . mouth rinse  15 mL Mouth Rinse BID  . propranolol  10 mg Oral BID  . risperiDONE  3 mg Oral BID   Continuous Infusions:    LOS: 8 days   Signature  Phillips Climes M.D on 06/14/2018 at 9:55 AM   -  To page go to [email or web address] - password [facility]

## 2018-06-14 NOTE — Progress Notes (Signed)
Pasrr provided: [id number] [place] LCSW [phone]

## 2018-06-14 NOTE — Clinical Social Work Placement (Signed)
   CLINICAL SOCIAL WORK PLACEMENT  NOTE  Date:  06/14/2018  Patient Details  Name: Tara Hurst MRN: [id number] Date of Birth: 1960-10-03  Clinical Social Work is seeking post-discharge placement for this patient at the [place] level of care (*CSW will initial, date and re-position this form in  chart as items are completed):  Yes   Patient/family provided with [facility] Work Department's list of facilities offering this level of care within the geographic area requested by the patient (or if unable, by the patient's family).  Yes   Patient/family informed of their freedom to choose among providers that offer the needed level of care, that participate in Medicare, Medicaid or managed care program needed by the patient, have an available bed and are willing to accept the patient.  Yes   Patient/family informed of [facility]'s ownership interest in [facility] and [facility], as well as of the fact that they are under no obligation to receive care at these facilities.  PASRR submitted to EDS on 06/10/18     PASRR number received on 06/14/18     Existing PASRR number confirmed on       FL2 transmitted to all facilities in geographic area requested by pt/family on 06/14/18     FL2 transmitted to all facilities within larger geographic area on       Patient informed that his/her managed care company has contracts with or will negotiate with certain facilities, including the following:        Yes   Patient/family informed of bed offers received.  Patient chooses bed at [facility]     Physician recommends and patient chooses bed at      Patient to be transferred to [facility] on 06/14/18.  Patient to be transferred to facility by [facility]     Patient family notified on 06/14/18 of transfer.  Name of family member notified:  Jenn[place]       Additional  Comment:    _______________________________________________ Benard Halsted, LCSW 06/14/2018, 12:04 PM

## 2018-08-14 ENCOUNTER — Telehealth: Payer: Self-pay | Admitting: Neurology

## 2018-08-14 ENCOUNTER — Encounter: Payer: Self-pay | Admitting: Neurology

## 2018-08-14 ENCOUNTER — Ambulatory Visit: Payer: Medicaid Other | Admitting: Neurology

## 2018-08-14 VITALS — BP 124/85 | HR 102 | Ht 64.0 in | Wt 137.0 lb

## 2018-08-14 DIAGNOSIS — G5632 Lesion of radial nerve, left upper limb: Secondary | ICD-10-CM | POA: Diagnosis not present

## 2018-08-14 NOTE — Patient Instructions (Signed)
Radial Nerve Palsy  Radial nerve palsy is the loss of function of the radial nerve in your arm or hand. The radial nerve extends from your shoulder, around the back of your upper arm, and down the outside of your lower arm. An injury to this nerve causes certain muscles and tendons in your arm and wrist not to work properly, which leads to a condition known as wrist drop. This means that you cannot extend your wrist. If you are standing with your arm stretched straight out in front of you, your wrist will bend and your hand will drop down toward the floor. An injury to the radial nerve may also result in lost feeling (sensation) in parts of your arm. What are the causes? Common causes of this condition include:  A break (fracture) of your upper or lower arm bone.  Complications from surgery.  Improper use of crutches. Crutches that are too long can put pressure on the radial nerve where it passes through the armpit (crutch palsy).  Keeping your arm in a position that places prolonged pressure on the radial nerve, such as by sleeping with arms over the back of a chair (Saturday night palsy).  Performing repetitive activities that involve rotation of your lower arm or movement of your wrist (repetitive use injury). What increases the risk? You are more likely to develop this condition if you:  Play contact sports.  Have a condition in which your body's immune system attacks your joints (rheumatoid arthritis).  Have diabetes.  Have an underactive thyroid (hypothyroidism). What are the signs or symptoms? Symptoms of this condition include:  Inability to extend your wrist.  Difficulty straightening your elbow and wrist.  Numbness or tingling in the back of your arm, forearm, or hand.  Inability to pinch.  Muscle of the injured arm looking smaller. How is this diagnosed? This condition is diagnosed with a history of the injury and a physical exam. To confirm the diagnosis, you may  also have tests, including:  Ultrasound. This test show if the nerve has an injury.  Nerve conduction studies. These tests show if the radial nerve is sending electrical signals well.  X-rays. These may be done if your health care provider suspects that you have an injury to the bones in your arm.  MRI. This may be used to determine the cause of your radial nerve palsy or to rule out other causes of your symptoms. How is this treated?     Treatment for this condition depends on the cause. It may involve:  Medicines. ? NSAIDs may be used to control pain. ? A steroid injection may be used to decrease swelling around the nerve.  Physical and occupational therapy. This may help you: ? Regain strength in your hand and wrist. ? Maintain range of motion of your wrist and elbow.  Splinting. Your health care provider may make one or more splints for you to wear during the day or at night to help with motion and positioning of your wrist.  Removing pressure on the radial nerve. This is done if the condition is caused by pressure on the nerve. Using this treatment may allow the nerve to go back to normal within a few weeks or a few months.  Surgery. Depending on the cause of your radial nerve palsy, surgery may be needed to: ? Remove pressure on the nerve (entrapment). ? Repair broken bones. ? Relocate (transfer) tendons in your lower arm to help you regain strength and mobility of  your wrist. ? Transfer a nerve to the injury site to restore nerve function. Follow these instructions at home: If you have a splint or brace  Wear the splint or brace as told by your health care provider. Remove it only as told by your health care provider.  Loosen the splint or brace if your fingers tingle, become numb, or turn cold and blue.  Keep the splint or brace clean.  If the splint or brace is not waterproof: ? Do not let it get wet. ? Cover it with a watertight covering when you take a bath or a  shower. Managing pain, stiffness, and swelling  If directed, put ice on the injured area: ? If you have a removable splint or brace, remove it as told by your health care provider. ? Put ice in a plastic bag. ? Place a towel between your skin and the bag. ? Leave the ice on for 20 minutes, 2-3 times a day.  Move your fingers often to avoid stiffness and to lessen swelling.  Raise (elevate) the injured area above the level of your heart while you are sitting or lying down. General instructions  Follow your health care provider's instructions about how to protect your hand and wrist.  Protect your hand from extreme temperature injuries, such as burns and frostbite.  Exercise your hand, wrist, and arm on a regular basis, as directed by your health care provider or therapist.  Keep all follow-up visits as told by your health care provider. This is important. Contact a health care provider if you:  Have a sudden increase in pain.  Develop new numbness or new loss of sensation in your hand. Get help right away if you:  Have a sudden change in your ability to move your arm, wrist, or hand.  Notice your fingers become bluish in color or feel cold to the touch. Summary  Radial nerve palsy is the loss of function of the radial nerve in your arm or hand. That nerve extends from your shoulder, around the back of your upper arm, and down the outside of your lower arm.  An injury to the radial nerve causes certain muscles and tendons in your arm and wrist not to work properly. This makes you unable to extend your wrist (a condition known as wrist drop). You may also lose feeling (sensation) in parts of your arm.  Treatment for this condition depends on the cause. It may include removing pressure on the radial nerve, physical and occupational therapy, splinting, medicines, or surgery. This information is not intended to replace advice given to you by your health care provider. Make sure you  discuss any questions you have with your health care provider. Document Released: 02/16/2006 Document Revised: 07/05/2017 Document Reviewed: 07/05/2017 Elsevier Interactive Patient Education  2019 [employer].

## 2018-08-14 NOTE — Progress Notes (Signed)
[facility]    Provider:  Dr Jaynee Eagles Referring Provider: [name], MD, Santa Lighter, FNP Primary Care Provider:  [name], MD, Santa Lighter, FNP  CC:  Hand weakness  HPI:  Tara Hurst is a 58 y.o. female here as requested by provider [name], MD for hand weakness.  Past medical history of hypertension, chronic kidney disease stage III, chronic anemia, chronic intermittent hypernatremia, anxiety, schizophrenia. She is here with her Education officer, museum. She woke up with left wrist drop. She did not go to the emergency room. It has not improved. Her social worker said she was in a twin bed. No neck pain or shooting pain in the arm. No physical therapy on her arm. She lives alone. It happened in rehab. No pain in shoulder or under the arm. Pure weakness, no pain or numbness or tingling. Stable.   Reviewed notes, labs and imaging from outside physicians, which showed:  Reviewed notes from [place] at [facility].  Patient reports left hand weakness.  It started July 16, 2018.  No loss of consciousness, TIA symptoms, or seizures.  Reports left hand weakness since the prior day she was last seen July 17, 2018.  Reviewed exam which was normal except for cognition she was alert and oriented x2, forgetful, left hand very weak 1 out of 5 grip strength but no other focal weakness noted.  Patient also has limited judgment and continues to have auditory hallucinations but no suicidal or homicidal thoughts.    Review of Systems: Patient complains of symptoms per HPI as well as the following symptoms: hand weakness. Pertinent negatives and positives per HPI. All others negative.   Social History   Socioeconomic History  . Marital status: Single    Spouse name: Not on file  . Number of children: 0  . Years of education: Not on file  . Highest education level: Associate degree: occupational, Hotel manager, or vocational program    Occupational History  . Not on file  Social Needs  . Financial resource strain: Not on file  . Food insecurity:    Worry: Not on file    Inability: Not on file  . Transportation needs:    Medical: Not on file    Non-medical: Not on file  Tobacco Use  . Smoking status: Never Smoker  . Smokeless tobacco: Never Used  Substance and Sexual Activity  . Alcohol use: No  . Drug use: No  . Sexual activity: Never  Lifestyle  . Physical activity:    Days per week: Not on file    Minutes per session: Not on file  . Stress: Not on file  Relationships  . Social connections:    Talks on phone: Not on file    Gets together: Not on file    Attends religious service: Not on file    Active member of club or organization: Not on file    Attends meetings of clubs or organizations: Not on file    Relationship status: Not on file  . Intimate partner violence:    Fear of current or ex partner: Not on file    Emotionally abused: Not on file    Physically abused: Not on file    Forced sexual activity: Not on file  Other Topics Concern  . Not on file  Social History Narrative   Lives at home alone   Right handed    Family History  Problem Relation Age of Onset  . Heart disease Mother   .  Diabetic kidney disease Mother   . Heart attack Mother   . Heart Problems Father     Past Medical History:  Diagnosis Date  . Anemia, unspecified   . Chronic kidney disease, stage 3 (moderate) (HCC)   . Generalized anxiety disorder   . Hyperosmolality and hypernatremia   . Hypertension   . Muscle weakness (generalized)   . Schizoaffective disorder [facility])     Patient Active Problem List   Diagnosis Date Noted  . 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 Malnutrition of moderate degree 06/14/2018  . AKI (acute kidney injury) ([place]) 06/06/2018  . Sepsis due to undetermined organism ([place]) 06/06/2018  . Tachycardia 12/03/2017  . Acute lower UTI 12/03/2017  . GAD (generalized anxiety disorder)  10/19/2017  . Anxiety   . UTI due to Klebsiella species   . Hypernatremia   . Acute encephalopathy 05/03/2017  . ARF (acute renal failure) ([place]) 05/03/2017  . HCAP (healthcare-associated pneumonia) 09/25/2013  . Hypertension 09/25/2013  . Abnormal lithium level in blood 09/25/2013    Past Surgical History:  Procedure Laterality Date  . NO PAST SURGERIES      Current Outpatient Medications  Medication Sig Dispense Refill  . benztropine (COGENTIN) 0.5 MG tablet Take 1 tablet (0.5 mg total) by mouth 2 (two) times daily. 60 tablet 2  . buPROPion (WELLBUTRIN XL) 150 MG 24 hr tablet Take 450 mg by mouth daily.     . clonazePAM (KLONOPIN) 0.5 MG tablet Take 1 tablet (0.5 mg total) by mouth 2 (two) times daily. 10 tablet 0  . Lurasidone HCl (LATUDA) 60 MG TABS Take 60 mg by mouth daily.    . polyethylene glycol (MIRALAX) packet Take 17 g by mouth daily. 28 each 0  . propranolol (INDERAL) 10 MG tablet Take 10 mg by mouth 2 (two) times daily.    . risperiDONE (RISPERDAL) 3 MG tablet Take 1 tablet (3 mg total) by mouth 2 (two) times daily. 10 tablet 0   No current facility-administered medications for this visit.     Allergies as of 08/14/2018  . (No Active Allergies)    Vitals: BP 124/85 (BP Location: Right Arm, Patient Position: Sitting)   Pulse (!) 102   Ht 5\' 4"  (1.626 m)   Wt 137 lb (62.1 kg)   BMI 23.52 kg/m  Last Weight:  Wt Readings from Last 1 Encounters:  08/14/18 137 lb (62.1 kg)   Last Height:   Ht Readings from Last 1 Encounters:  08/14/18 5\' 4"  (1.626 m)     Physical exam: Exam: Gen: NAD, conversant                 CV: RRR, no MRG. No Carotid Bruits. No peripheral edema, warm, nontender Eyes: Conjunctivae clear without exudates or hemorrhage  Neuro: Detailed Neurologic Exam  Speech:    Speech is normal; fluent and spontaneous with normal comprehension.  Cognition:    The patient is oriented to person, place, and time;     recent and remote memory  impaired;     language fluent;     impairedattention, concentration,     fund of knowledge Cranial Nerves:    The pupils are equal, round, and reactive to light.  Attempted for endoscopy could not visualize. Who the left limb look after this patient in the [facility] Visual fields are full to finger confrontation. Extraocular movements are intact. Trigeminal sensation is intact and the muscles of mastication are normal. The face is symmetric. The palate elevates  in the midline. Hearing intact. Voice is normal. Shoulder shrug is normal. The tongue has normal motion without fasciculations.   Coordination:    Normal finger to nose  Gait:    No ataxia  Motor Observation:    No asymmetry, no atrophy, and no involuntary movements noted. Tone:    Normal muscle tone.    Posture:    Posture is normal. normal erect    Strength: prox weakness LE, left wrist drop with intact median motor in the hand, ulnar may be weak but symmetrical with the right hand so suggests no ulnar involvement either. Also triceps is normal.           Sensation: intact to LT     Reflex Exam:  DTR's:    Absent AJs otherwise deep tendon reflexes in the upper and lower extremities are symmetrical bilaterally.   Toes:    The toes are downgoing bilaterally.   Clonus:    Clonus is absent.    Assessment/Plan:  Patient who woke up with Radial Nerve Palsy, injury distal to the triceps. No pain and no fractures felt or pain to palpation.She woke up with it so may have caused ischemia by her position. 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   Ambulatory PT to her house, will call [facility] [phone], [phone] as they are already going to see her for leg weakness Orthosis - gave prescription Emg/ncs  - bilateral uppers  Orders Placed This Encounter  Procedures  . NCV with EMG(electromyography)     Cc: [name], MD,  Santa Lighter, FNP  Sarina Ill,  MD  [facility] [address] [place],  [address]  Phone [phone] Fax [phone]

## 2018-08-14 NOTE — Telephone Encounter (Signed)
Tara Hurst, patient needs Ambulatory PT to her house for PT on her wrist drop but she is already getting [facility] out there for her leg weakness can you call [facility] [phone], [phone] as they are already going to see her for leg weakness and see if we can add on PT for her new wrist drop? thanks

## 2018-08-15 NOTE — Telephone Encounter (Signed)
Spoke with [facility] @ [phone] and spoke with front desk. I asked to add on PT for wrist drop. She will have a clinical manager call me back to discuss. She is aware our office is closing @ noon today & closed tomorrow d/t weather.

## 2018-08-15 NOTE — Telephone Encounter (Signed)
Tara Hurst with [facility] called me back. I discussed that pt was seen yesterday by Dr. Jaynee Eagles for new visit for wrist drop of the L wrist. I gave v.o. from Dr. Jaynee Eagles to see if PT for wrist drop could be added to the leg weakness that PT already sees her for. She stated they would make sure they took care of it and verbalized appreciation.

## 2018-08-15 NOTE — Telephone Encounter (Signed)
Thanks, can we try and get her in sooner for emg/ncs? Lets work on that next week. thanks

## 2018-08-20 NOTE — Telephone Encounter (Signed)
D/w dr. Jaynee Eagles. Tried to get her in sooner but couldn't reach. pt seeing PT and orthosis ordered so we can just continue with EMG/NCS on 3/26.

## 2018-08-20 NOTE — Telephone Encounter (Signed)
Called pt's home #, multiple rings no answer or vm. Called pt's mobile number, received a "disconnected number" message. Called pt's social worker on [employer], [place] box was full. Will try to reach patient again later.

## 2018-08-20 NOTE — Telephone Encounter (Signed)
We do have an opening for EMG/NCS on Thurs 3/5. Dr. Jaynee Eagles aware. Will call to offer.

## 2018-08-20 NOTE — Telephone Encounter (Signed)
Received paper order for adding PT. Order signed by Dr. Jaynee Eagles and faxed back to [facility]. Received a receipt of confirmation.

## 2018-09-19 ENCOUNTER — Encounter: Payer: Medicaid Other | Admitting: Neurology

## 2018-10-04 IMAGING — DX DG CHEST 1V PORT
1 series · 1 of 1 positions shown · non-contrast
Comparison: 05/02/2017

CLINICAL DATA: Hypertension.  Fever.

EXAM:
PORTABLE CHEST 1 VIEW

[chest ap]
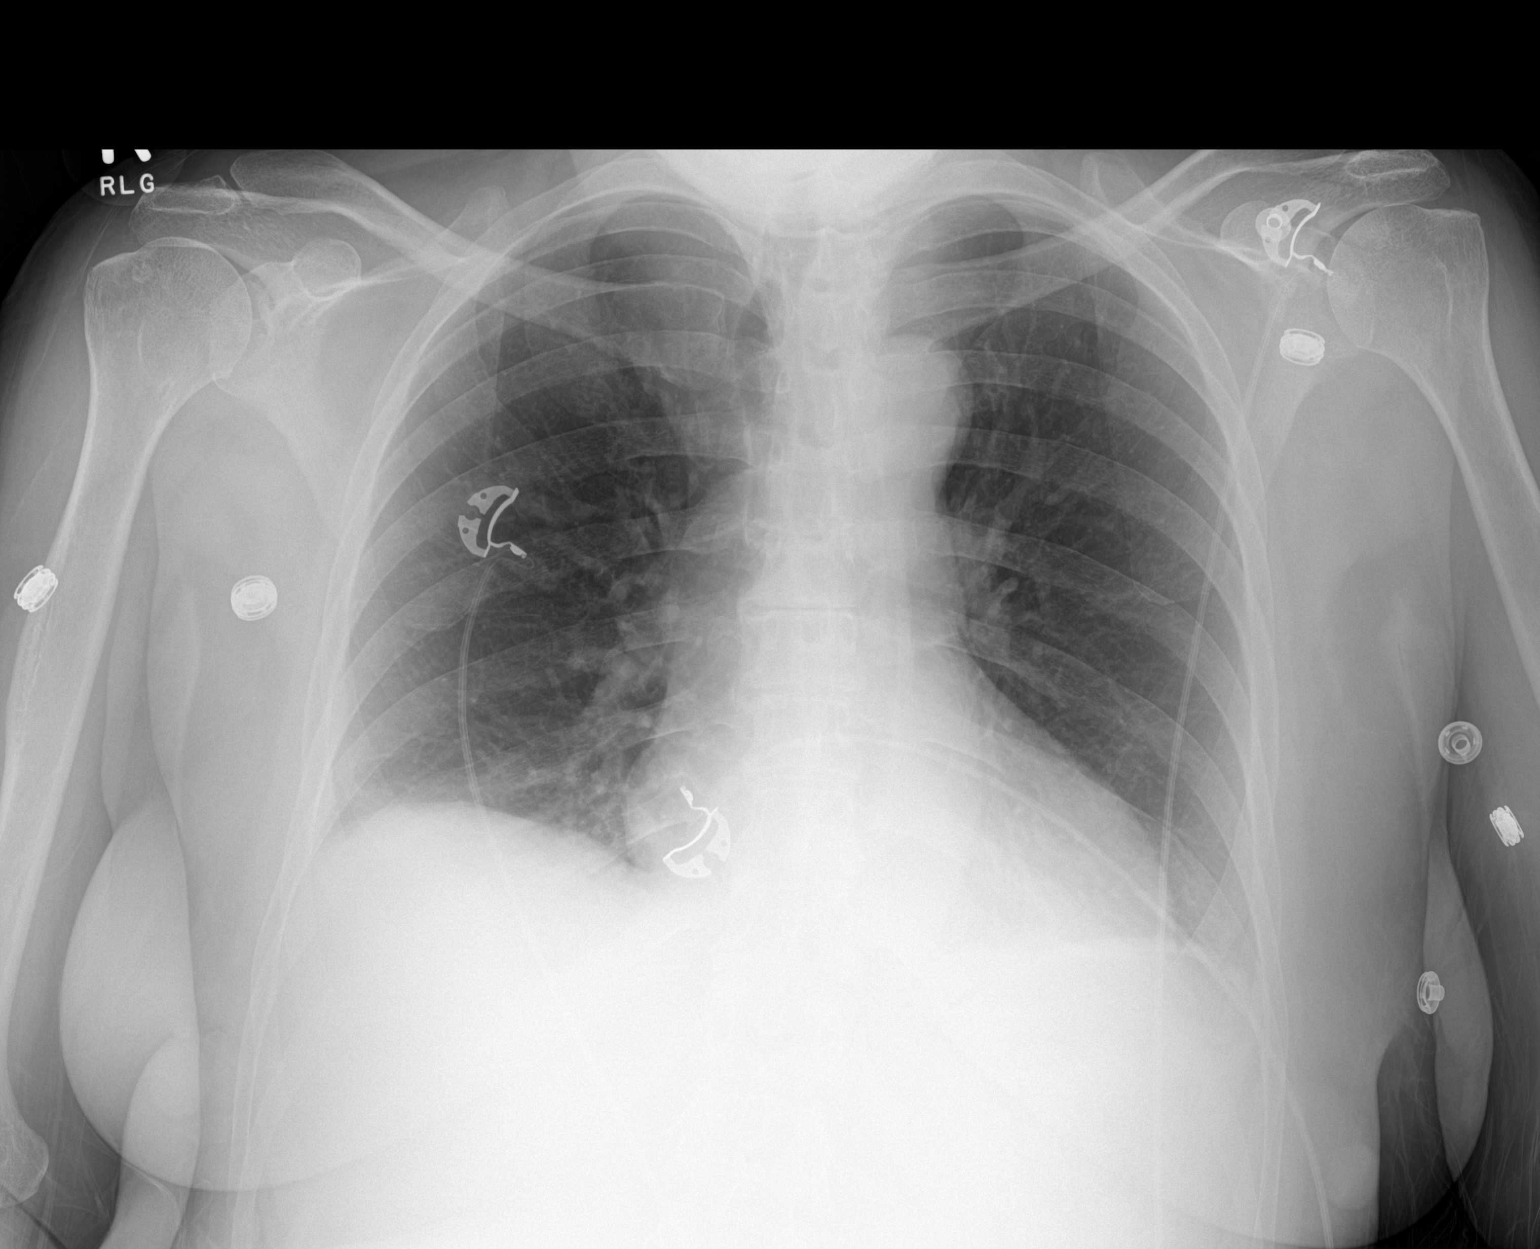

[1 of 1 positions shown; findings below may reference images not displayed]

FINDINGS: Heart size is normal. Mediastinal shadows are normal. There is
patchy density at both lung bases that could be atelectasis or mild
pneumonia. No dense consolidation or lobar collapse. No abnormal
bone finding.
IMPRESSION: Development of patchy density at both lung bases right more than
left that could be atelectasis or mild pneumonia.

## 2018-11-04 ENCOUNTER — Telehealth: Payer: Self-pay

## 2018-11-04 NOTE — Telephone Encounter (Signed)
I called and left a voicemail for patient asking that she call us back to reschedule her nerve conduction study with Dr. Jaynee Eagles that was originally scheduled for March 26.

## 2018-11-05 ENCOUNTER — Telehealth: Payer: Self-pay | Admitting: Family Medicine

## 2018-11-05 NOTE — Telephone Encounter (Signed)
A text has been sent with instructions on how to access their doxy.me visit with Amy on 11/12/2018 at 1:30 pm

## 2018-11-05 NOTE — Telephone Encounter (Signed)
Pts site nurse Dorette Grate called and consented to a Virtual Visit and for pts insurance to be billed as such. Pt will be on Site Nurses smart phone for her VV. Number is [phone] [employer]

## 2018-11-06 ENCOUNTER — Ambulatory Visit (HOSPITAL_COMMUNITY)
Admission: EM | Admit: 2018-11-06 | Discharge: 2018-11-06 | Disposition: A | Discharge status: Home / Self Care | Payer: Medicaid Other | Attending: Psychiatry | Admitting: Psychiatry

## 2018-11-06 ENCOUNTER — Encounter (HOSPITAL_COMMUNITY): Payer: Self-pay | Admitting: Behavioral Health

## 2018-11-06 NOTE — BH Assessment (Signed)
[place] Assessment Progress Note  Per L. Marcello Moores, [place], this pt does not require psychiatric hospitalization at this time.  Pt is to be discharged from [facility] with recommendation to continue treatment with the [facility] Team.  This has been included in pt's discharge instructions.  Pt's nurse has been notified.  Jalene Mullet, [place] Triage Specialist [phone]

## 2018-11-06 NOTE — H&P (Signed)
Behavioral Health Medical Screening Exam  Tara Hurst is an 58 y.o. female.who presented as a walk-in to [facility]. She was transported by [facility] voluntarily. She reports she called the police because she was feeling paranoid describing paranoia as," somebody wanting to shave her head." Reports after telling [employer] her thoughts, they recommended that she be psychiatrically evaluated and she was willing to be evaluated. She state the paranoid thought has increased her anxiety. She denies other paranoid thoughts, delusions, auditory or visual hallucinations, or other psychotic process. She does have a history of Schizophrenia and Bipolar Disorder. She denies other negative symptom that may be associated with Schizophrenia. She denies any manic episodes. She denies active or passive suicidal thoughts, homicidal ideas, or feelings of depression. She has an ACT team and receives medication management through [facility]. Reports she was recently started on medication for anxiety and other psychotropic medications are Latuda, Risperdal and Cogentin. Reports he has not been psychiatrically hospitalized since 1990.  Total Time spent with patient: 20 minutes  Psychiatric Specialty Exam: Physical Exam  Nursing note and vitals reviewed. Constitutional: She is oriented to person, place, and time.  Neurological: She is alert and oriented to person, place, and time.    Review of Systems  Psychiatric/Behavioral: Negative for depression, hallucinations, memory loss, substance abuse and suicidal ideas. The patient is nervous/anxious. The patient does not have insomnia.   All other systems reviewed and are negative.   There were no vitals taken for this visit.There is no height or weight on file to calculate BMI.  General Appearance: Fairly Groomed  Eye Contact:  Good  Speech:  Clear and Coherent and Slow  Volume:  Normal  Mood:  Anxious  Affect:  Congruent  Thought Process:  Coherent, Goal Directed, Linear and Descriptions  of Associations: Intact  Orientation:  Full (Time, Place, and Person)  Thought Content:  No hallucinations, ruminations, or preoccupations. Positive for parnoid ideation..   Suicidal Thoughts:  No  Homicidal Thoughts:  No  Memory:  Immediate;   Fair  Judgement:  Fair  Insight:  Fair  Psychomotor Activity:  Normal  Concentration: Concentration: Fair and Attention Span: Fair  Recall:  [employer] of Knowledge:Fair  Language: Good  Akathisia:  Negative  Handed:  Right  AIMS (if indicated):     Assets:  Communication Skills Desire for Improvement Resilience  Sleep:       Musculoskeletal: Strength & Muscle Tone: within normal limits Gait & Station: normal Patient leans: N/A  There were no vitals taken for this visit.  Recommendations:  Based on my evaluation the patient does not appear to have an emergency medical condition.  No evidence of imminent risk to self or others at present.   Patient does not meet criteria for psychiatric inpatient admission. Reccommended to continue outpatient psychiatric treatment (ACT team and [facility]) as well as currently prescribed medications.      Mordecai Maes, NP 11/06/2018, 9:13 AM

## 2018-11-06 NOTE — Discharge Instructions (Signed)
For your behavioral health needs, you are advised to continue treatment with the [facility] ACT Team:       [facility]      [address]      [place], [place] [address]      [phone]      Crisis number: [phone]

## 2018-11-06 NOTE — BH Assessment (Signed)
Assessment Note  Tara Hurst is a 58 y.o. female who presented to [facility] as a voluntary walk-in (transported by police) with complaint of delusion.  Pt lives alone in [place], and she is on disability. Pt receives outpatient services through [facility], and she is followed by an ACT Team.  Pt was last assessed by TTS in April 2019.  Pt reported that she called the police because she is concerned about her neighbors -- specifically, they are threatening to shave her head in exchange for $40,000.  Pt's account was unclear, and she stated, ''It's hard to explain.'' She reported that her neighbors or possibly the police chief are threatening to shave her head in exchange for money.  ''I don't want to be scalped.''  Pt denied suicidal ideation, past suicide attempts, homicidal ideation, self-injurious behavior, and hallucination.  Pt reported that she takes Latuda, Risperdol, and Cogentin for Schizoaffective Disorder, and she is compliant with medication.  Pt stated that she wanted to tell us about the situation, and now she wanted to go home.  Pt is followed by Beverly Sessions, and she is also followed by an ACT Team (cannot recall which).  Pt stated that her next appointment with Beverly Sessions is in June.  During assessment, Pt presented as alert and oriented.  She had good eye contact and was cooperative.  Demeanor was pleasant.  Pt reported mood as apprehensive, and affect was mood congruent.  Pt's speech was normal in rate, rhythm, and volume.  Thought process suggested thought blocking; thought content suggested delusion.  Pt's memory was fair.  Concentration was good.  Insight and judgment were fair to poor.  Impulse control was good.  Consulted with L. Marcello Moores, FNP, who also spoke with Pt.  It was determined that Pt does not meet inpatient criteria.   Diagnosis: Schizoaffective Disorder  Past Medical History:  Past Medical History:  Diagnosis Date  . Anemia, unspecified   . Chronic kidney disease, stage 3  (moderate) (HCC)   . Generalized anxiety disorder   . Hyperosmolality and hypernatremia   . Hypertension   . Muscle weakness (generalized)   . Schizoaffective disorder [facility])     Past Surgical History:  Procedure Laterality Date  . NO PAST SURGERIES      Family History:  Family History  Problem Relation Age of Onset  . Heart disease Mother   . Diabetic kidney disease Mother   . Heart attack Mother   . Heart Problems Father     Social History:  reports that she has never smoked. She has never used smokeless tobacco. She reports that she does not drink alcohol or use drugs.  Additional Social History:  Alcohol / Drug Use Pain Medications: See MAR Prescriptions: See MAR Over the Counter: See MAR History of alcohol / drug use?: No history of alcohol / drug abuse  CIWA:   COWS:    Allergies: No Active Allergies  Home Medications: (Not in a hospital admission)   OB/GYN Status:  No LMP recorded. Patient is postmenopausal.  General Assessment Data Location of Assessment: [facility] Assessment Services TTS Assessment: In system Is this a Tele or Face-to-Face Assessment?: Face-to-Face Is this an Initial Assessment or a Re-assessment for this encounter?: Initial Assessment Language Other than English: No Living Arrangements: Other (Comment) What gender do you identify as?: Female Marital status: Single Maiden name: [place] Pregnancy Status: No Living Arrangements: Alone Can pt return to current living arrangement?: Yes Admission Status: Voluntary Is patient capable of signing voluntary admission?: Yes Referral  Source: Self/Family/Friend Insurance type: [place] MCD  Medical Screening Exam [facility] Walk-in ONLY) Medical Exam completed: Yes  Crisis Care Plan Living Arrangements: Alone Name of Psychiatrist: [place] Name of Therapist: [facility]  Education Status Is patient currently in school?: No Is the patient employed, unemployed or receiving disability?: Receiving disability  income  Risk to self with the past 6 months Suicidal Ideation: No Has patient been a risk to self within the past 6 months prior to admission? : No Suicidal Intent: No Has patient had any suicidal intent within the past 6 months prior to admission? : No Is patient at risk for suicide?: No Suicidal Plan?: No Has patient had any suicidal plan within the past 6 months prior to admission? : No Access to Means: No What has been your use of drugs/alcohol within the last 12 months?: Denied Previous Attempts/Gestures: No Intentional Self Injurious Behavior: None Family Suicide History: Unknown Recent stressful life event(s): Other (Comment)(Delusion) Persecutory voices/beliefs?: No Depression: No Substance abuse history and/or treatment for substance abuse?: No Suicide prevention information given to non-admitted patients: Not applicable  Risk to Others within the past 6 months Homicidal Ideation: No Does patient have any lifetime risk of violence toward others beyond the six months prior to admission? : No Thoughts of Harm to Others: No Current Homicidal Intent: No Current Homicidal Plan: No Access to Homicidal Means: No History of harm to others?: No Assessment of Violence: None Noted Does patient have access to weapons?: No Criminal Charges Pending?: No Does patient have a court date: No Is patient on probation?: No  Psychosis Hallucinations: None noted(Pt denied) Delusions: Persecutory(See notes)  Mental Status Report Appearance/Hygiene: Layered clothes, Other (Comment)(Pajamas, bath robe) Eye Contact: Good Motor Activity: Freedom of movement, Unremarkable Speech: Logical/coherent Level of Consciousness: Alert Mood: Apprehensive Affect: Apprehensive Anxiety Level: Moderate Thought Processes: Thought Blocking Judgement: Partial Orientation: Person, Place, Time, Situation Obsessive Compulsive Thoughts/Behaviors: None  Cognitive Functioning Concentration:  Normal Memory: Remote Intact, Recent Impaired Is patient IDD: No Insight: Poor Impulse Control: Good Appetite: Good Have you had any weight changes? : No Change Sleep: No Change Vegetative Symptoms: None  ADLScreening [facility] Assessment Services) Patient's cognitive ability adequate to safely complete daily activities?: Yes Patient able to express need for assistance with ADLs?: Yes Independently performs ADLs?: Yes (appropriate for developmental age)  Prior Inpatient Therapy Prior Inpatient Therapy: Yes Reason for Treatment: Schizoaffective Disorder  Prior Outpatient Therapy Prior Outpatient Therapy: Yes Prior Therapy Dates: Ongoing Prior Therapy Facilty/Provider(s): [facility] Reason for Treatment: Schizoaffective Disorder Does patient have an ACCT team?: Yes Does patient have Intensive In-House Services?  : No Does patient have [facility] services? : Yes Does patient have P4CC services?: No  ADL Screening (condition at time of admission) Patient's cognitive ability adequate to safely complete daily activities?: Yes Is the patient deaf or have difficulty hearing?: No Does the patient have difficulty seeing, even when wearing glasses/contacts?: No Does the patient have difficulty concentrating, remembering, or making decisions?: No Patient able to express need for assistance with ADLs?: Yes Does the patient have difficulty dressing or bathing?: No Independently performs ADLs?: Yes (appropriate for developmental age) Does the patient have difficulty walking or climbing stairs?: No Weakness of Legs: None Weakness of Arms/Hands: None  Home Assistive Devices/Equipment Home Assistive Devices/Equipment: None  Therapy Consults (therapy consults require a physician order) PT Evaluation Needed: No OT Evalulation Needed: No SLP Evaluation Needed: No Abuse/Neglect Assessment (Assessment to be complete while patient is alone) Abuse/Neglect Assessment Can Be Completed: Yes Physical  Abuse: Denies Verbal Abuse: Denies Sexual Abuse: Denies Exploitation of patient/patient's resources: Denies Self-Neglect: Denies Values / Beliefs Cultural Requests During Hospitalization: None Spiritual Requests During Hospitalization: None Consults Spiritual Care Consult Needed: No Social Work Consult Needed: No Regulatory affairs officer (For Healthcare) Does Patient Have a Medical Advance Directive?: No          Disposition:  Disposition Initial Assessment Completed for this Encounter: Yes Disposition of Patient: Discharge(Per L. Marcello Moores, FNP, Pt does not meet inpt criteria)  On Site Evaluation by:   Reviewed with Physician:    Laurena Slimmer Keelyn Monjaras 11/06/2018 9:16 AM

## 2018-11-07 ENCOUNTER — Telehealth: Payer: Self-pay

## 2018-11-07 NOTE — Telephone Encounter (Signed)
Spoke with the patient and she has given verbal consent to file her insurance and to do a telephone visit with Amy on 11/12/2018. She stated that she does have a cell phone but it doesn't have a camera. She also stated that she has no one to help her her setup the doxy.me visit

## 2018-11-12 ENCOUNTER — Encounter: Payer: Self-pay | Admitting: Family Medicine

## 2018-11-12 ENCOUNTER — Ambulatory Visit (INDEPENDENT_AMBULATORY_CARE_PROVIDER_SITE_OTHER): Payer: Medicaid Other | Admitting: Family Medicine

## 2018-11-12 ENCOUNTER — Other Ambulatory Visit: Payer: Self-pay

## 2018-11-12 DIAGNOSIS — G5632 Lesion of radial nerve, left upper limb: Secondary | ICD-10-CM | POA: Diagnosis not present

## 2018-11-12 NOTE — Progress Notes (Signed)
PATIENT: Tara Hurst DOB: 22-May-1961  REASON FOR VISIT: follow up HISTORY FROM: patient  Virtual Visit via Telephone Note  I connected with Tara Hurst on 11/12/18 at  1:30 PM EDT by telephone and verified that I am speaking with the correct person using two identifiers.   I discussed the limitations, risks, security and privacy concerns of performing an evaluation and management service by telephone and the availability of in person appointments. I also discussed with the patient that there may be a patient responsible charge related to this service. The patient expressed understanding and agreed to proceed.   History of Present Illness:  11/12/18 Tara Hurst is a 58 y.o. female for follow up. She is doing well. She was recently discharged from a mental health facility. She is being cared for by an ACT nurse during the day at her home. She states that her hand is significantly improved on it's own. She is able to button her blouse and use her hand to insert her key in to the door. She denies pain, numbness or tingling. She did not have NCS performed and did not receive PT. She does not feel that it is warranted at this time.    History (copied from Dr Luna Kitchens note on 08/14/2018)  HPI:  Tara Hurst is a 58 y.o. female here as requested by provider [name], MD for hand weakness.  Past medical history of hypertension, chronic kidney disease stage III, chronic anemia, chronic intermittent hypernatremia, anxiety, schizophrenia. She is here with her Education officer, museum. She woke up with left wrist drop. She did not go to the emergency room. It has not improved. Her social worker said she was in a twin bed. No neck pain or shooting pain in the arm. No physical therapy on her arm. She lives alone. It happened in rehab. No pain in shoulder or under the arm. Pure weakness, no pain or numbness or tingling. Stable.   Reviewed notes, labs and imaging from outside physicians, which showed:   Reviewed notes from [place] at [facility].  Patient reports left hand weakness.  It started July 16, 2018.  No loss of consciousness, TIA symptoms, or seizures.  Reports left hand weakness since the prior day she was last seen July 17, 2018.  Reviewed exam which was normal except for cognition she was alert and oriented x2, forgetful, left hand very weak 1 out of 5 grip strength but no other focal weakness noted.  Patient also has limited judgment and continues to have auditory hallucinations but no suicidal or homicidal thoughts.   Observations/Objective:  Generalized: Well developed, in no acute distress  Mentation: Alert oriented to time, place, history taking. Follows all commands speech and language fluent   Assessment and Plan:  58 y.o. year old female  has a past medical history of Anemia, unspecified, Chronic kidney disease, stage 3 (moderate) ([name]), Generalized anxiety disorder, Hyperosmolality and hypernatremia, Hypertension, Muscle weakness (generalized), and Schizoaffective disorder ([name]). here with    ICD-10-CM   1. Radial nerve palsy, left G56.32    Palsy appears to be resolving. She is able to perform all ADL's and does not feel limited with the exception of opening jars of food. She does not feel that PT or NCS are warranted at this time. She was advised to call the office should she change her mind and we will proceed with original plans. She and her nurse aide verbalize understanding and agreement with plan.   No orders  of the defined types were placed in this encounter.   No orders of the defined types were placed in this encounter.    Follow Up Instructions:  I discussed the assessment and treatment plan with the patient. The patient was provided an opportunity to ask questions and all were answered. The patient agreed with the plan and demonstrated an understanding of the instructions.   The patient was advised to call back or seek an in-person  evaluation if the symptoms worsen or if the condition fails to improve as anticipated.  I provided 20 minutes of non-face-to-face time during this encounter. Patient and her nurse aide are located at patient's place of residence during video conference.  Provider is located in office.  [name], RN helped to facilitate visit.   [name], NP

## 2018-11-12 NOTE — Progress Notes (Signed)
Made any corrections needed, and agree with history, physical, neuro exam,assessment and plan as stated.     Lynnlee Revels, MD [facility]  

## 2018-11-19 ENCOUNTER — Other Ambulatory Visit: Payer: Self-pay

## 2018-11-19 ENCOUNTER — Encounter (HOSPITAL_COMMUNITY): Payer: Self-pay | Admitting: Emergency Medicine

## 2018-11-19 ENCOUNTER — Emergency Department (HOSPITAL_COMMUNITY)
Admission: EM | Admit: 2018-11-19 | Discharge: 2018-11-19 | Disposition: A | Discharge status: Home / Self Care | Payer: Medicaid Other | Attending: Emergency Medicine | Admitting: Emergency Medicine

## 2018-11-19 DIAGNOSIS — R410 Disorientation, unspecified: Secondary | ICD-10-CM | POA: Diagnosis not present

## 2018-11-19 DIAGNOSIS — I129 Hypertensive chronic kidney disease with stage 1 through stage 4 chronic kidney disease, or unspecified chronic kidney disease: Secondary | ICD-10-CM | POA: Diagnosis not present

## 2018-11-19 DIAGNOSIS — R443 Hallucinations, unspecified: Secondary | ICD-10-CM

## 2018-11-19 DIAGNOSIS — R4182 Altered mental status, unspecified: Secondary | ICD-10-CM | POA: Diagnosis present

## 2018-11-19 DIAGNOSIS — N183 Chronic kidney disease, stage 3 (moderate): Secondary | ICD-10-CM | POA: Insufficient documentation

## 2018-11-19 DIAGNOSIS — Z79899 Other long term (current) drug therapy: Secondary | ICD-10-CM | POA: Diagnosis not present

## 2018-11-19 LAB — CBC WITH DIFFERENTIAL/PLATELET
Abs Immature Granulocytes: 0.04 10*3/uL (ref 0.00–0.07)
Basophils Absolute: 0.1 10*3/uL (ref 0.0–0.1)
Basophils Relative: 1 %
Eosinophils Absolute: 0.3 10*3/uL (ref 0.0–0.5)
Eosinophils Relative: 4 %
HCT: 36.2 % (ref 36.0–46.0)
Hemoglobin: 11.6 g/dL — ABNORMAL LOW (ref 12.0–15.0)
Immature Granulocytes: 1 %
Lymphocytes Relative: 22 %
Lymphs Abs: 1.8 10*3/uL (ref 0.7–4.0)
MCH: 26.9 pg (ref 26.0–34.0)
MCHC: 32 g/dL (ref 30.0–36.0)
MCV: 83.8 fL (ref 80.0–100.0)
Monocytes Absolute: 0.6 10*3/uL (ref 0.1–1.0)
Monocytes Relative: 7 %
Neutro Abs: 5.5 10*3/uL (ref 1.7–7.7)
Neutrophils Relative %: 65 %
Platelets: 257 10*3/uL (ref 150–400)
RBC: 4.32 MIL/uL (ref 3.87–5.11)
RDW: 12.9 % (ref 11.5–15.5)
WBC: 8.3 10*3/uL (ref 4.0–10.5)
nRBC: 0 % (ref 0.0–0.2)

## 2018-11-19 LAB — RAPID URINE DRUG SCREEN, HOSP PERFORMED
Amphetamines: NOT DETECTED
Barbiturates: NOT DETECTED
Benzodiazepines: NOT DETECTED
Cocaine: NOT DETECTED
Opiates: NOT DETECTED
Tetrahydrocannabinol: NOT DETECTED

## 2018-11-19 LAB — BASIC METABOLIC PANEL
Anion gap: 10 (ref 5–15)
BUN: 13 mg/dL (ref 6–20)
CO2: 24 mmol/L (ref 22–32)
Calcium: 10.5 mg/dL — ABNORMAL HIGH (ref 8.9–10.3)
Chloride: 109 mmol/L (ref 98–111)
Creatinine, Ser: 1.65 mg/dL — ABNORMAL HIGH (ref 0.44–1.00)
GFR calc Af Amer: 40 mL/min — ABNORMAL LOW (ref >60)
GFR calc non Af Amer: 34 mL/min — ABNORMAL LOW (ref >60)
Glucose, Bld: 94 mg/dL (ref 70–99)
Potassium: 4.2 mmol/L (ref 3.5–5.1)
Sodium: 143 mmol/L (ref 135–145)

## 2018-11-19 LAB — ETHANOL: Alcohol, Ethyl (B): <10 mg/dL (ref <10)

## 2018-11-19 MED ORDER — RISPERIDONE 3 MG PO TABS
3.0000 mg | ORAL_TABLET | Freq: Two times a day (BID) | ORAL | Status: DC
  Administered 2018-11-19: 3 mg via ORAL
  Filled 2018-11-19 (×2): qty 1

## 2018-11-19 MED ORDER — BENZTROPINE MESYLATE 1 MG PO TABS
0.5000 mg | ORAL_TABLET | Freq: Two times a day (BID) | ORAL | Status: DC
  Administered 2018-11-19: 15:00:00 0.5 mg via ORAL
  Filled 2018-11-19: qty 1

## 2018-11-19 NOTE — ED Notes (Signed)
Social worker provided cab voucher so that patient could get back home safely

## 2018-11-19 NOTE — ED Notes (Signed)
Taxi called for patient and is on their way to pick up patient

## 2018-11-19 NOTE — ED Notes (Signed)
Pt advised she could not give a urine sample right now.

## 2018-11-19 NOTE — ED Notes (Signed)
Patient verbalizes understanding of discharge instructions. Opportunity for questioning and answering were provided.  patient discharged from ED.  

## 2018-11-19 NOTE — ED Triage Notes (Signed)
Pt is alert and oriented to date, time and president, and stated someone came to my house and I think it was the police and said I needed to go to hospital right now.  He came to my door and said I need to come here for my health. Pt lives alone.

## 2018-11-19 NOTE — Progress Notes (Addendum)
CSW received consult for patient due to concerns regarding her orientation and mental status based on her reports to medical staff. CSW met with patient at bedside to complete assessment. Patient was very receptive and willing to engage in conversation with CSW. Patient reports that she came to the hospital this morning because there was a man outside of her apartment telling her she needed to seek medical treatment as soon as possible. Patient stated that the individual she saw was a white female Engineer, structural. Patient denies knowing or ever seeing the man in the past. Patient reports that the man was not dressed in a police uniform.  Patient reports that she was diagnosed with schizoaffective disorder in 1991 and has received SSI disability ever since. Patient reports that she takes Risperdal twice daily, and that her dose is 63m. Patient states she did not take her medicine this morning as she normally does. Patient states she always takes her medication and had no reason for why this morning was an exception. Patient reports never being married or having children. Patient reports that she has lived alone for the past three years and prior to that she resided with her mother who passed away.Patient reports that her medication works very well for her. Patient reports having a sEducation officer, museumnamed TOlivia Mackiethrough GCendant Corporationthat assists her with obtaining groceries. Patient reports not having a drivers license and that she utilizes [employer] to get to her medical appointments. Patient reports that her PCP is GVincente Liberty Patient [facility] Patient reports [facility]. Patient reports feeling safe at home. Patient denies any thoughts or feelings of self harm. Patient reports feeling lonely sometimes but states she enjoys  life.  CSW spoke with PA to discuss plan for patient. CSW advocated for patient to receive a dose of Risperdal prior to discharge to ensure she returns to her baseline without auditory or visual hallucinations, PA agreeable.  [facility] she paid $20 for so CSW will send patient home with a cab voucher. CSW provided voucher to patient's RN.  CMadilyn Fireman MSW, LCSW-A Clinical Social Worker [facility] [phone]

## 2018-11-19 NOTE — ED Provider Notes (Signed)
[facility] EMERG[facility]Y DEPARTMENT Provider Note   CSN: [id number] Arrival date & time: 11/19/18  0911    History   Chief Complaint Chief Complaint  Patient presents with   Altered Mental Status    HPI LEGEND PECORE is a 58 y.o. female.     The history is provided by the patient. No language interpreter was used.  Altered Mental Status  Presenting symptoms: confusion   Severity:  Moderate Most recent episode:  Today Progression:  Worsening Chronicity:  New Context: not recent illness and not recent infection   Pt reports the Police knocked on her door and told her she was going to get a citation if she   Past Medical History:  Diagnosis Date   Anemia, unspecified    Chronic kidney disease, stage 3 (moderate) (HCC)    Generalized anxiety disorder    Hyperosmolality and hypernatremia    Hypertension    Muscle weakness (generalized)    Schizoaffective disorder [facility])     Patient Active Problem List   Diagnosis Date Noted   Radial nerve palsy, 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   Malnutrition of moderate degree 06/14/2018   AKI (acute kidney injury) ([place]) 06/06/2018   Sepsis due to undetermined organism ([place]) 06/06/2018   Tachycardia 12/03/2017   Acute lower UTI 12/03/2017   GAD (generalized anxiety disorder) 10/19/2017   Anxiety    UTI due to Klebsiella species    Hypernatremia    Acute encephalopathy 05/03/2017   ARF (acute renal failure) ([place]) 05/03/2017   HCAP (healthcare-associated pneumonia) 09/25/2013   Hypertension 09/25/2013   Abnormal lithium level in blood 09/25/2013    Past Surgical History:  Procedure Laterality Date   NO PAST SURGERIES       OB History   No obstetric history on file.      Home Medications    Prior to Admission medications   Medication Sig Start Date End Date Taking? Authorizing Provider  benztropine (COGENTIN) 0.5 MG tablet Take 1 tablet (0.5 mg total) by mouth 2 (two) times daily. 09/28/13    Kinnie Feil, MD  buPROPion (WELLBUTRIN XL) 150 MG 24 hr tablet Take 450 mg by mouth daily.     [provider]  clonazePAM (KLONOPIN) 0.5 MG tablet Take 1 tablet (0.5 mg total) by mouth 2 (two) times daily. 06/13/18   Thurnell Lose, MD  Lurasidone HCl (LATUDA) 60 MG TABS Take 60 mg by mouth daily.    [provider]  polyethylene glycol (MIRALAX) packet Take 17 g by mouth daily. 06/13/18   Thurnell Lose, MD  propranolol (INDERAL) 10 MG tablet Take 10 mg by mouth 2 (two) times daily.    [provider]  risperiDONE (RISPERDAL) 3 MG tablet Take 1 tablet (3 mg total) by mouth 2 (two) times daily. 12/04/17   Aline August, MD    Family History Family History  Problem Relation Age of Onset   Heart disease Mother    Diabetic kidney disease Mother    Heart attack Mother    Heart Problems Father     Social History Social History   Tobacco Use   Smoking status: Never Smoker   Smokeless tobacco: Never Used  Substance Use Topics   Alcohol use: No   Drug use: No     Allergies   Patient has no active allergies.   Review of Systems Review of Systems  Psychiatric/Behavioral: Positive for confusion.  All other systems reviewed and are negative.    Physical  Exam Updated Vital Signs BP (!) 158/92    Pulse 70    Temp 98.3 F (36.8 C) (Oral)    Resp 17    SpO2 98%   Physical Exam Vitals signs and nursing note reviewed.  Constitutional:      Appearance: She is well-developed.  HENT:     Head: Normocephalic.     Right Ear: Tympanic membrane normal.     Left Ear: Tympanic membrane normal.     Nose: Nose normal.  Eyes:     Pupils: Pupils are equal, round, and reactive to light.  Neck:     Musculoskeletal: Normal range of motion.  Cardiovascular:     Rate and Rhythm: Normal rate.     Pulses: Normal pulses.  Pulmonary:     Effort: Pulmonary effort is normal.  Abdominal:     General: There is no distension.  Musculoskeletal:  Normal range of motion.  Skin:    General: Skin is warm.  Neurological:     General: No focal deficit present.     Mental Status: She is alert and oriented to person, place, and time.  Psychiatric:        Mood and Affect: Mood normal.      ED Treatments / Results  Labs (all labs ordered are listed, but only abnormal results are displayed) Labs Reviewed  CBC WITH DIFFERENTIAL/PLATELET - Abnormal; Notable for the following components:      Result Value   Hemoglobin 11.6 (*)    All other components within normal limits  BASIC METABOLIC PANEL - Abnormal; Notable for the following components:   Creatinine, Ser 1.65 (*)    Calcium 10.5 (*)    GFR calc non Af Amer 34 (*)    GFR calc Af Amer 40 (*)    All other components within normal limits  RAPID URINE DRUG SCREEN, HOSP PERFORMED  ETHANOL    EKG None  Radiology No results found.  Procedures Procedures (including critical care time)  Medications Ordered in ED Medications  benztropine (COGENTIN) tablet 0.5 mg (has no administration in time range)  risperiDONE (RISPERDAL) tablet 3 mg (has no administration in time range)     Initial Impression / Assessment and Plan / ED Course  I have reviewed the triage vital signs and the nursing notes.  Pertinent labs & imaging results that were available during my care of the patient were reviewed by me and considered in my medical decision making (see chart for details).        MDM  Social worker evaluated pt.  Pt has good resources.  Pt has hallucinations at baseline.  Social work reports pt is in a safe environment. Pt did not take her medications this morning.  Pt given a dosage here.   Final Clinical Impressions(s) / ED Diagnoses   Final diagnoses:  Confusion  Hallucinations    ED Discharge Orders    None    An After Visit Summary was printed and given to the patient.    Fransico Meadow, [place] 11/19/18 1422    Carmin Muskrat, MD 11/19/18 1452

## 2018-11-19 NOTE — ED Notes (Signed)
Pt back in bed.

## 2018-11-19 NOTE — ED Notes (Signed)
This RN acting as Art therapist and asked pt if she would like for me to call any family/friends for her. Pt declined, does not want any family called.

## 2018-11-19 NOTE — ED Notes (Signed)
Pt ambulatory to restroom

## 2018-11-28 ENCOUNTER — Other Ambulatory Visit: Payer: Self-pay

## 2018-11-28 ENCOUNTER — Encounter: Payer: Medicaid Other | Admitting: Neurology

## 2019-02-18 ENCOUNTER — Emergency Department (HOSPITAL_COMMUNITY)
Admission: EM | Admit: 2019-02-18 | Discharge: 2019-02-18 | Disposition: A | Discharge status: Home / Self Care | Payer: Medicaid Other | Attending: Emergency Medicine | Admitting: Emergency Medicine

## 2019-02-18 ENCOUNTER — Other Ambulatory Visit: Payer: Self-pay

## 2019-02-18 DIAGNOSIS — N289 Disorder of kidney and ureter, unspecified: Secondary | ICD-10-CM

## 2019-02-18 DIAGNOSIS — Z79899 Other long term (current) drug therapy: Secondary | ICD-10-CM | POA: Diagnosis not present

## 2019-02-18 DIAGNOSIS — R5381 Other malaise: Secondary | ICD-10-CM | POA: Diagnosis present

## 2019-02-18 DIAGNOSIS — N183 Chronic kidney disease, stage 3 (moderate): Secondary | ICD-10-CM | POA: Diagnosis not present

## 2019-02-18 DIAGNOSIS — I129 Hypertensive chronic kidney disease with stage 1 through stage 4 chronic kidney disease, or unspecified chronic kidney disease: Secondary | ICD-10-CM | POA: Diagnosis not present

## 2019-02-18 DIAGNOSIS — I10 Essential (primary) hypertension: Secondary | ICD-10-CM

## 2019-02-18 LAB — CBC WITH DIFFERENTIAL/PLATELET
Abs Immature Granulocytes: 0.02 10*3/uL (ref 0.00–0.07)
Basophils Absolute: 0 10*3/uL (ref 0.0–0.1)
Basophils Relative: 0 %
Eosinophils Absolute: 0.1 10*3/uL (ref 0.0–0.5)
Eosinophils Relative: 1 %
HCT: 35.6 % — ABNORMAL LOW (ref 36.0–46.0)
Hemoglobin: 11.5 g/dL — ABNORMAL LOW (ref 12.0–15.0)
Immature Granulocytes: 0 %
Lymphocytes Relative: 22 %
Lymphs Abs: 1.8 10*3/uL (ref 0.7–4.0)
MCH: 26.7 pg (ref 26.0–34.0)
MCHC: 32.3 g/dL (ref 30.0–36.0)
MCV: 82.6 fL (ref 80.0–100.0)
Monocytes Absolute: 0.8 10*3/uL (ref 0.1–1.0)
Monocytes Relative: 9 %
Neutro Abs: 5.5 10*3/uL (ref 1.7–7.7)
Neutrophils Relative %: 68 %
Platelets: 270 10*3/uL (ref 150–400)
RBC: 4.31 MIL/uL (ref 3.87–5.11)
RDW: 12.9 % (ref 11.5–15.5)
WBC: 8.2 10*3/uL (ref 4.0–10.5)
nRBC: 0 % (ref 0.0–0.2)

## 2019-02-18 LAB — URINALYSIS, ROUTINE W REFLEX MICROSCOPIC
Bilirubin Urine: NEGATIVE
Glucose, UA: NEGATIVE mg/dL
Hgb urine dipstick: NEGATIVE
Ketones, ur: NEGATIVE mg/dL
Leukocytes,Ua: NEGATIVE
Nitrite: NEGATIVE
Protein, ur: NEGATIVE mg/dL
Specific Gravity, Urine: 1.006 (ref 1.005–1.030)
pH: 7 (ref 5.0–8.0)

## 2019-02-18 LAB — COMPREHENSIVE METABOLIC PANEL
ALT: 11 U/L (ref 0–44)
AST: 16 U/L (ref 15–41)
Albumin: 4.3 g/dL (ref 3.5–5.0)
Alkaline Phosphatase: 92 U/L (ref 38–126)
Anion gap: 11 (ref 5–15)
BUN: 23 mg/dL — ABNORMAL HIGH (ref 6–20)
CO2: 24 mmol/L (ref 22–32)
Calcium: 11 mg/dL — ABNORMAL HIGH (ref 8.9–10.3)
Chloride: 112 mmol/L — ABNORMAL HIGH (ref 98–111)
Creatinine, Ser: 1.98 mg/dL — ABNORMAL HIGH (ref 0.44–1.00)
GFR calc Af Amer: 32 mL/min — ABNORMAL LOW (ref >60)
GFR calc non Af Amer: 27 mL/min — ABNORMAL LOW (ref >60)
Glucose, Bld: 86 mg/dL (ref 70–99)
Potassium: 3.8 mmol/L (ref 3.5–5.1)
Sodium: 147 mmol/L — ABNORMAL HIGH (ref 135–145)
Total Bilirubin: 0.8 mg/dL (ref 0.3–1.2)
Total Protein: 7.3 g/dL (ref 6.5–8.1)

## 2019-02-18 NOTE — ED Triage Notes (Signed)
Pt arrives by [facility]. Pt was found laying in a strangers backyard. Pt reports she had been walking around and "got tired" so she saw a backyard and decided to go lay down. EMS reports she was estimated 2 miles from her home.  Pt told EMS she has had an odor to her urine X2 months

## 2019-02-18 NOTE — ED Notes (Signed)
Pt was able to pass the PO challenge. She was able to successfully eat some crackers and drink some sprite without any complications.

## 2019-02-18 NOTE — ED Notes (Addendum)
Pt is alert and oriented X4

## 2019-02-18 NOTE — Discharge Instructions (Signed)
Testing did not show any significant problems other than mild high blood pressure.  There is no sign of a urinary tract infection.  It is important to clean the area around your buttocks and vagina, well with soap and water every day.  Make sure you follow-up with your doctor for checkup next week.  May be a little dehydrated so make sure that you drink 2 L of water each day.  This will make you feel better, and will likely improve your kidney function.

## 2019-02-18 NOTE — ED Provider Notes (Signed)
[facility] EMERGENCY DEPARTMENT Provider Note   CSN: [id number] Arrival date & time: 02/18/19  1227     History   Chief Complaint Chief Complaint  Patient presents with  . Weakness    HPI Tara Hurst is a 58 y.o. female.     HPI   Patient presents by EMS.  She was found wandering, and and a backyard of someone she did not know.  Patient currently complains of a odor, about her body that she described as "musty," that other people notice and are concerned about.  She feels like her urine smells bad as well.  He denies vaginal discharge.  She denies fever, chills, nausea, vomiting, cough, shortness of breath or chest pain.  There are no other known modifying factors.    Past Medical History:  Diagnosis Date  . Anemia, unspecified   . Chronic kidney disease, stage 3 (moderate) (HCC)   . Generalized anxiety disorder   . Hyperosmolality and hypernatremia   . Hypertension   . Muscle weakness (generalized)   . Schizoaffective disorder [facility])     Patient Active Problem List   Diagnosis Date Noted  . 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 Malnutrition of moderate degree 06/14/2018  . AKI (acute kidney injury) ([place]) 06/06/2018  . Sepsis due to undetermined organism ([place]) 06/06/2018  . Tachycardia 12/03/2017  . Acute lower UTI 12/03/2017  . GAD (generalized anxiety disorder) 10/19/2017  . Anxiety   . UTI due to Klebsiella species   . Hypernatremia   . Acute encephalopathy 05/03/2017  . ARF (acute renal failure) ([place]) 05/03/2017  . HCAP (healthcare-associated pneumonia) 09/25/2013  . Hypertension 09/25/2013  . Abnormal lithium level in blood 09/25/2013    Past Surgical History:  Procedure Laterality Date  . NO PAST SURGERIES       OB History   No obstetric history on file.      Home Medications    Prior to Admission medications   Medication Sig Start Date End Date Taking? Authorizing Provider  benztropine (COGENTIN) 0.5 MG tablet Take 1  tablet (0.5 mg total) by mouth 2 (two) times daily. 09/28/13   Kinnie Feil, MD  buPROPion (WELLBUTRIN XL) 150 MG 24 hr tablet Take 450 mg by mouth daily.     [provider]  clonazePAM (KLONOPIN) 0.5 MG tablet Take 1 tablet (0.5 mg total) by mouth 2 (two) times daily. 06/13/18   Thurnell Lose, MD  Lurasidone HCl (LATUDA) 60 MG TABS Take 60 mg by mouth daily.    [provider]  polyethylene glycol (MIRALAX) packet Take 17 g by mouth daily. 06/13/18   Thurnell Lose, MD  propranolol (INDERAL) 10 MG tablet Take 10 mg by mouth 2 (two) times daily.    [provider]  risperiDONE (RISPERDAL) 3 MG tablet Take 1 tablet (3 mg total) by mouth 2 (two) times daily. 12/04/17   Aline August, MD    Family History Family History  Problem Relation Age of Onset  . Heart disease Mother   . Diabetic kidney disease Mother   . Heart attack Mother   . Heart Problems Father     Social History Social History   Tobacco Use  . Smoking status: Never Smoker  . Smokeless tobacco: Never Used  Substance Use Topics  . Alcohol use: No  . Drug use: No     Allergies   Patient has no active allergies.   Review of Systems Review of Systems  All other systems reviewed and are negative.    Physical Exam Updated Vital Signs BP (!) 152/105   Pulse 92   Temp 99.7 F (37.6 C) (Oral)   Resp 18   SpO2 98%   Physical Exam Vitals signs and nursing note reviewed.  Constitutional:      General: She is not in acute distress.    Appearance: Normal appearance. She is well-developed. She is not ill-appearing, toxic-appearing or diaphoretic.     Comments: She is somewhat disheveled  HENT:     Head: Normocephalic and atraumatic.     Right Ear: External ear normal.     Left Ear: External ear normal.  Eyes:     Conjunctiva/sclera: Conjunctivae normal.     Pupils: Pupils are equal, round, and reactive to light.  Neck:     Musculoskeletal: Normal range of motion and neck  supple.     Trachea: Phonation normal.  Cardiovascular:     Rate and Rhythm: Normal rate and regular rhythm.     Heart sounds: Normal heart sounds.  Pulmonary:     Effort: Pulmonary effort is normal.     Breath sounds: Normal breath sounds.  Abdominal:     Palpations: Abdomen is soft.     Tenderness: There is no abdominal tenderness.  Musculoskeletal: Normal range of motion.  Skin:    General: Skin is warm and dry.  Neurological:     Mental Status: She is alert and oriented to person, place, and time.     Cranial Nerves: No cranial nerve deficit.     Sensory: No sensory deficit.     Motor: No abnormal muscle tone.     Coordination: Coordination normal.     Comments: No dysarthria or aphasia.  Psychiatric:        Behavior: Behavior normal.        Thought Content: Thought content normal.        Judgment: Judgment normal.      ED Treatments / Results  Labs (all labs ordered are listed, but only abnormal results are displayed) Labs Reviewed  COMPREHENSIVE METABOLIC PANEL - Abnormal; Notable for the following components:      Result Value   Sodium 147 (*)    Chloride 112 (*)    BUN 23 (*)    Creatinine, Ser 1.98 (*)    Calcium 11.0 (*)    GFR calc non Af Amer 27 (*)    GFR calc Af Amer 32 (*)    All other components within normal limits  CBC WITH DIFFERENTIAL/PLATELET - Abnormal; Notable for the following components:   Hemoglobin 11.5 (*)    HCT 35.6 (*)    All other components within normal limits  URINALYSIS, ROUTINE W REFLEX MICROSCOPIC - Abnormal; Notable for the following components:   Color, Urine STRAW (*)    All other components within normal limits    EKG None  Radiology No results found.  Procedures Procedures (including critical care time)  Medications Ordered in ED Medications - No data to display   Initial Impression / Assessment and Plan / ED Course  I have reviewed the triage vital signs and the nursing notes.  Pertinent labs & imaging  results that were available during my care of the patient were reviewed by me and considered in my medical decision making (see chart for details).  Clinical Course as of Feb 18 1503  Tue Feb 18, 2019  1443 Normal  Urinalysis, Routine w reflex microscopic(!) [EW]  1443  Normal  CBC with Differential(!) [EW]  1443 Normal except sodium elevated, chloride high, BUN high, creatinine high, calcium high  Comprehensive metabolic panel(!) [EW]    Clinical Course User Index [EW] Daleen Bo, MD        Patient Vitals for the past 24 hrs:  BP Temp Temp src Pulse Resp SpO2  02/18/19 1400 (!) 152/105 - - 92 18 98 %  02/18/19 1345 (!) 148/107 - - 93 (!) 30 100 %  02/18/19 1300 (!) 155/103 - - (!) 102 (!) 26 99 %  02/18/19 1245 (!) 142/101 - - 99 (!) 23 98 %  02/18/19 1235 (!) 132/106 99.7 F (37.6 C) Oral (!) 109 (!) 30 98 %    3:00 PM Reevaluation with update and discussion. After initial assessment and treatment, an updated evaluation reveals she is tolerating oral nutrition and hydration.  Findings discussed with the patient all questions were answered. Daleen Bo   Medical Decision Making: Lucky Rathke, with reassuring work-up.  Mild renal insufficiency somewhat worse than baseline however no evidence for significant renal failure.  Per nursing when the urine catheterization was done patient had poor hygiene which likely explains her concern for malodorous urine and at the most concern.  Doubt urinary tract infection, metabolic instability or impending vascular collapse.  Doubt hypertensive urgency.  CRITICAL CARE-no Performed by: Daleen Bo   Nursing Notes Reviewed/ Care Coordinated Applicable Imaging Reviewed Interpretation of Laboratory Data incorporated into ED treatment  The patient appears reasonably screened and/or stabilized for discharge and I doubt any other medical condition or other [facility] requiring further screening, evaluation, or treatment in the ED at this time prior to  discharge.  Plan: Home Medications-continue usual; Home Treatments-try to drink more fluids; return here if the recommended treatment, does not improve the symptoms; Recommended follow up-follow-up with a primary care doctor soon as possible for further evaluation treatment.  She will need close monitoring of blood pressure, within the next month.    Final Clinical Impressions(s) / ED Diagnoses   Final diagnoses:  Malaise  Hypertension, unspecified type  Renal insufficiency    ED Discharge Orders    None       Daleen Bo, MD 02/18/19 1504

## 2019-05-02 IMAGING — CT CT HEAD W/O CM
3 series · 14 of 47 positions shown, 16 images · non-contrast
Comparison: October 18, 2017

CLINICAL DATA: Mental issues per the patient.

EXAM:
CT HEAD WITHOUT CONTRAST
TECHNIQUE: Contiguous axial images were obtained from the base of the skull
through the vertex without intravenous contrast.

[Series 2: head wo · axial · 0.47mm/px · z∈[-197,-72]mm · 8 of 31 slices shown, 10 images]
[im 3/31  brain]
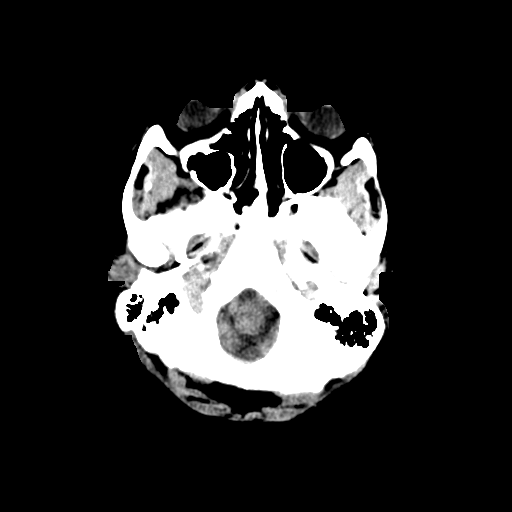
[im 3/31  bone]
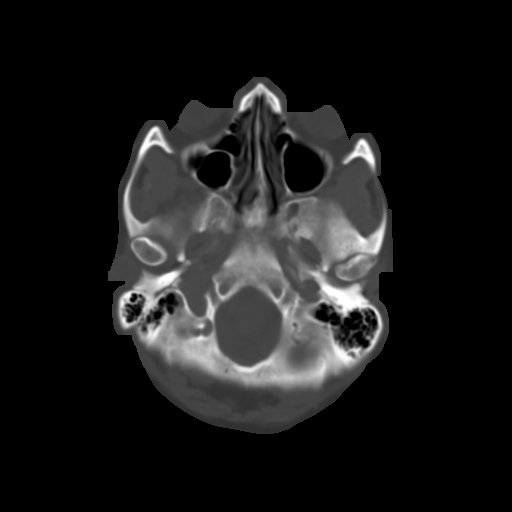
[im 7/31  brain]
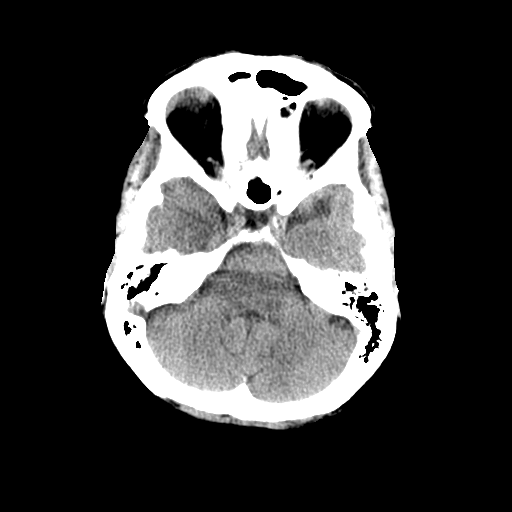
[im 10/31  brain]
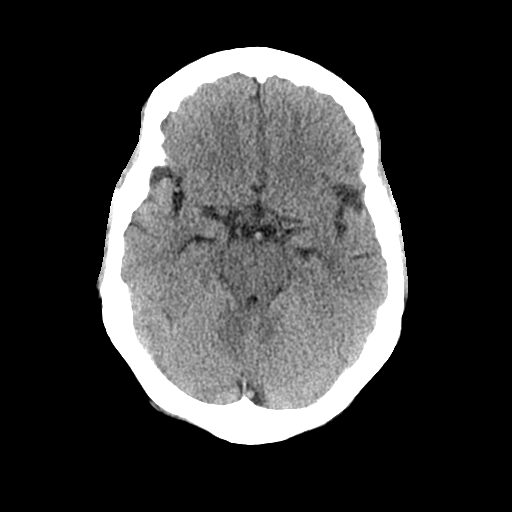
[im 14/31  brain]
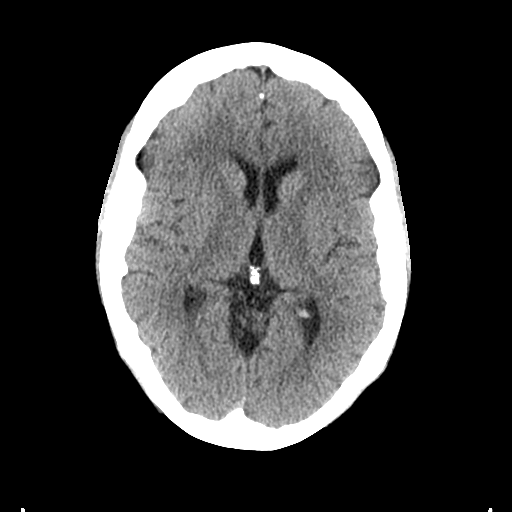
[im 17/31  brain]
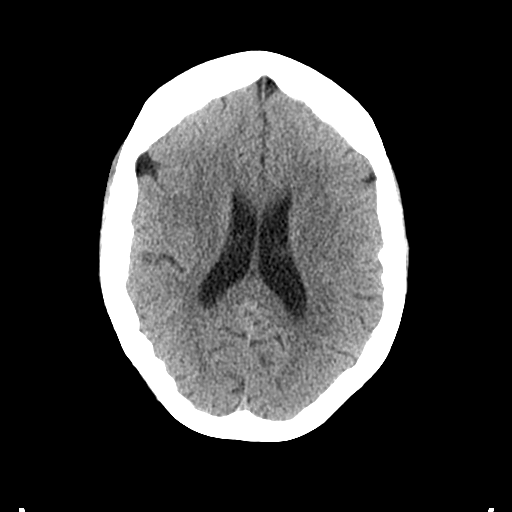
[im 17/31  bone]
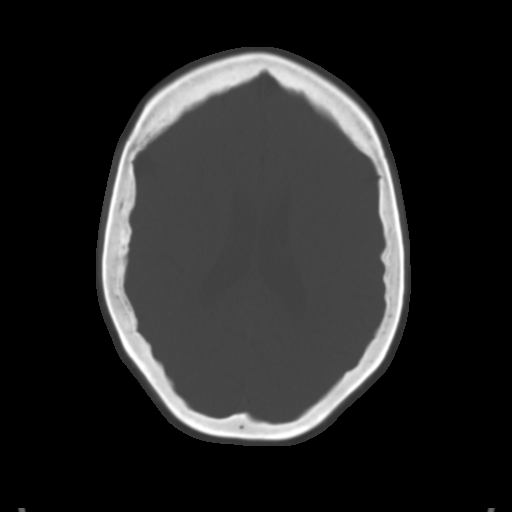
[im 21/31  brain]
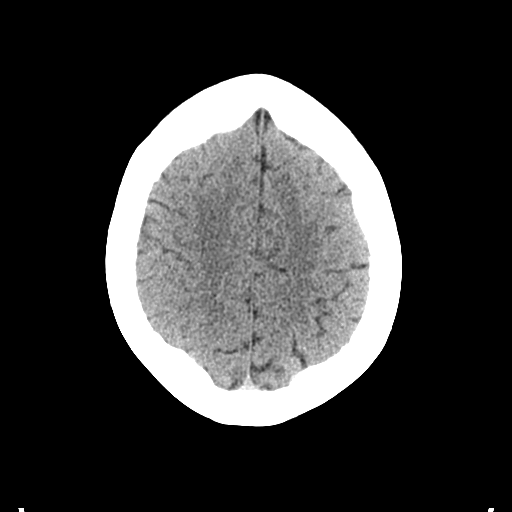
[im 24/31  brain]
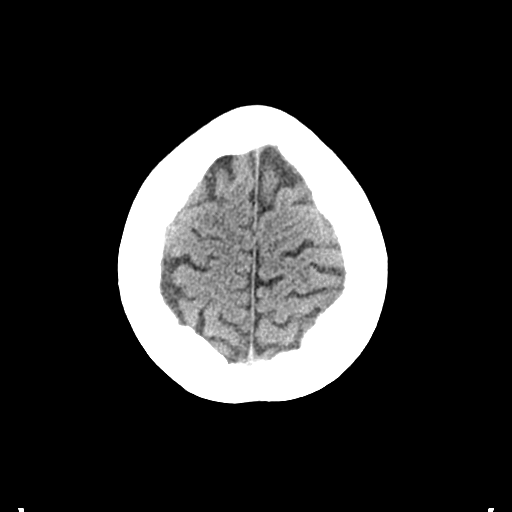
[im 28/31  brain]
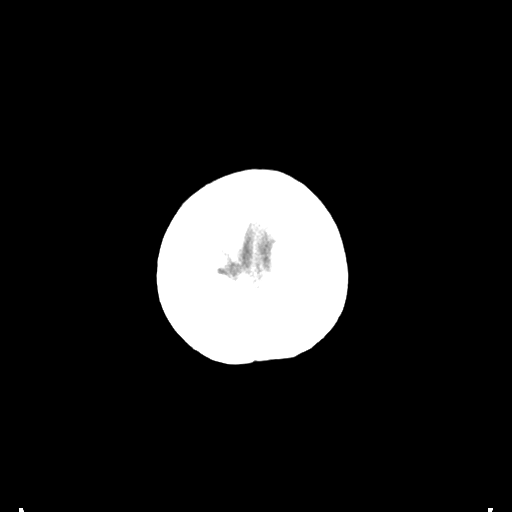

[Series 4: coronal soft tissue · coronal · 0.39mm/px · 3 of 68 slices shown]
[im 23/68  brain]
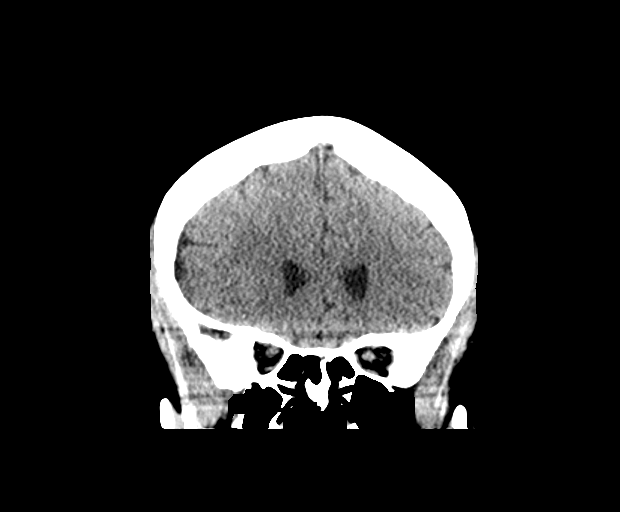
[im 30/68  brain]
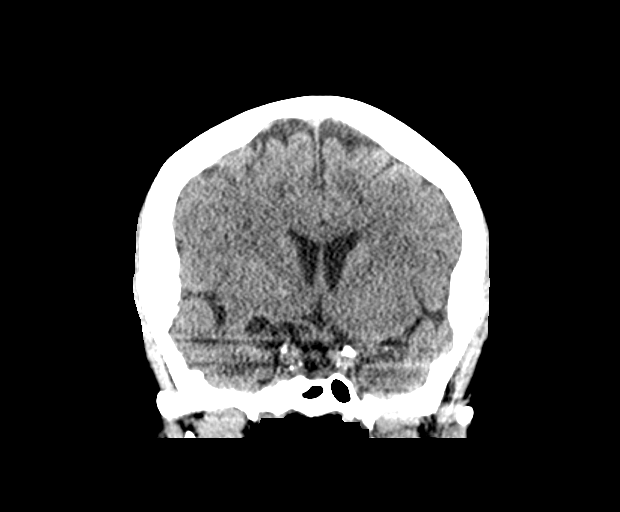
[im 38/68  brain]
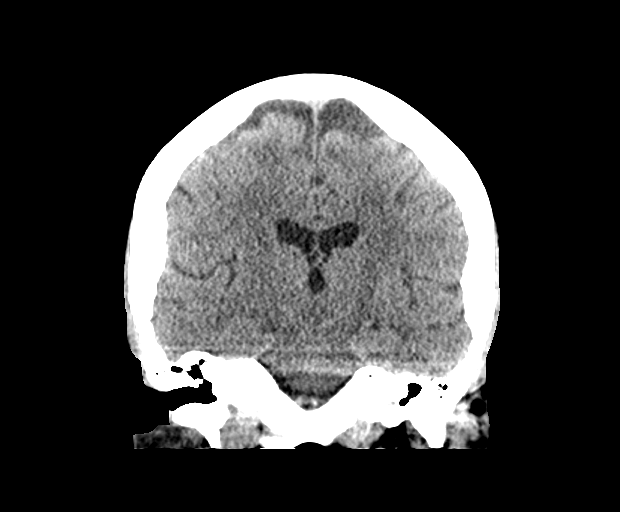

[Series 5: sagittal soft tissue · sagittal · 0.38mm/px · 3 of 53 slices shown]
[im 18/53  brain]
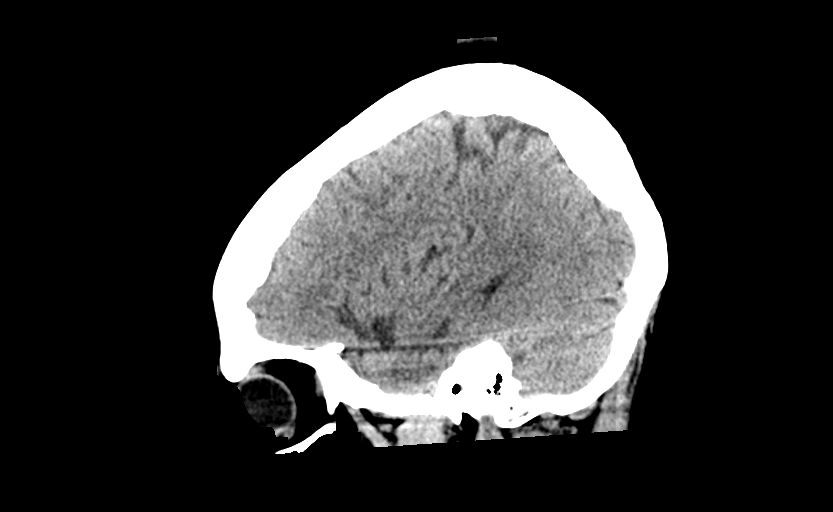
[im 27/53  brain]
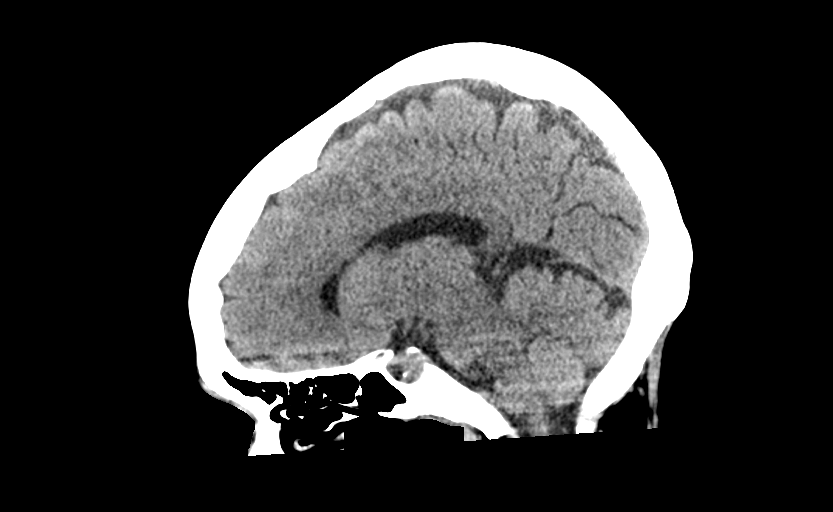
[im 35/53  brain]
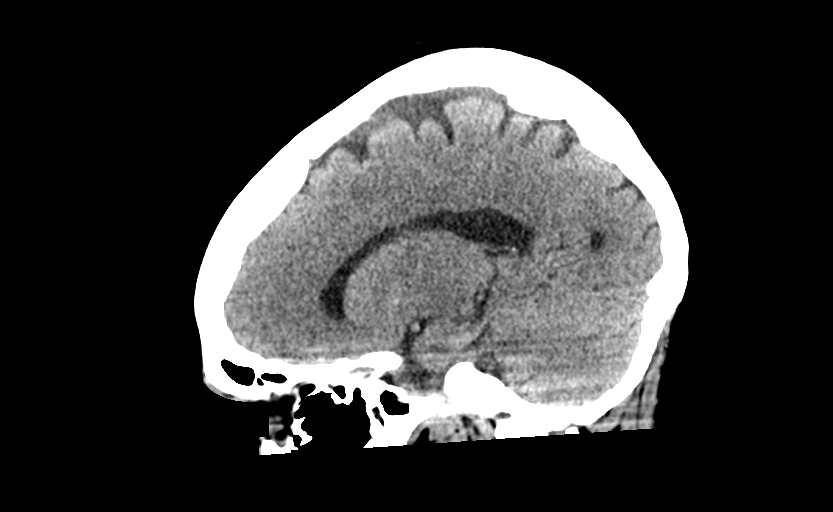

[14 of 47 positions shown; findings below may reference images not displayed]

FINDINGS: Brain: No evidence of acute infarction, hemorrhage, hydrocephalus,
extra-axial collection or mass lesion/mass effect.

Vascular: No hyperdense vessel or unexpected calcification.

Skull: Normal. Negative for fracture or focal lesion.

Sinuses/Orbits: No acute finding.

Other: None.
IMPRESSION: No cause for the patient's symptoms identified.

## 2019-08-04 ENCOUNTER — Other Ambulatory Visit (HOSPITAL_COMMUNITY): Payer: Self-pay | Admitting: Pulmonary Disease

## 2019-08-04 ENCOUNTER — Other Ambulatory Visit: Payer: Self-pay

## 2019-08-04 ENCOUNTER — Ambulatory Visit (HOSPITAL_COMMUNITY)
Admission: RE | Admit: 2019-08-04 | Discharge: 2019-08-04 | Disposition: A | Discharge status: Home / Self Care | Payer: Medicaid Other | Source: Ambulatory Visit | Attending: Pulmonary Disease | Admitting: Pulmonary Disease

## 2019-08-04 DIAGNOSIS — M79672 Pain in left foot: Secondary | ICD-10-CM

## 2019-09-13 ENCOUNTER — Other Ambulatory Visit: Payer: Self-pay

## 2019-09-13 ENCOUNTER — Encounter (HOSPITAL_COMMUNITY): Payer: Self-pay | Admitting: Emergency Medicine

## 2019-09-13 ENCOUNTER — Emergency Department (HOSPITAL_COMMUNITY): Payer: Medicaid Other

## 2019-09-13 ENCOUNTER — Emergency Department (HOSPITAL_COMMUNITY)
Admission: EM | Admit: 2019-09-13 | Discharge: 2019-09-14 | Disposition: A | Discharge status: Home / Self Care | Payer: Medicaid Other | Attending: Emergency Medicine | Admitting: Emergency Medicine

## 2019-09-13 DIAGNOSIS — S0083XA Contusion of other part of head, initial encounter: Secondary | ICD-10-CM | POA: Diagnosis not present

## 2019-09-13 DIAGNOSIS — Y939 Activity, unspecified: Secondary | ICD-10-CM | POA: Diagnosis not present

## 2019-09-13 DIAGNOSIS — S0990XA Unspecified injury of head, initial encounter: Secondary | ICD-10-CM | POA: Insufficient documentation

## 2019-09-13 DIAGNOSIS — Y999 Unspecified external cause status: Secondary | ICD-10-CM | POA: Diagnosis not present

## 2019-09-13 DIAGNOSIS — T148XXA Other injury of unspecified body region, initial encounter: Secondary | ICD-10-CM

## 2019-09-13 DIAGNOSIS — W06XXXA Fall from bed, initial encounter: Secondary | ICD-10-CM | POA: Diagnosis not present

## 2019-09-13 DIAGNOSIS — I129 Hypertensive chronic kidney disease with stage 1 through stage 4 chronic kidney disease, or unspecified chronic kidney disease: Secondary | ICD-10-CM | POA: Diagnosis not present

## 2019-09-13 DIAGNOSIS — Z79899 Other long term (current) drug therapy: Secondary | ICD-10-CM | POA: Diagnosis not present

## 2019-09-13 DIAGNOSIS — Y92122 Bedroom in nursing home as the place of occurrence of the external cause: Secondary | ICD-10-CM | POA: Diagnosis not present

## 2019-09-13 DIAGNOSIS — N183 Chronic kidney disease, stage 3 unspecified: Secondary | ICD-10-CM | POA: Insufficient documentation

## 2019-09-13 DIAGNOSIS — W19XXXA Unspecified fall, initial encounter: Secondary | ICD-10-CM

## 2019-09-13 NOTE — ED Notes (Signed)
Pt to CT

## 2019-09-13 NOTE — ED Notes (Signed)
[facility] called for pt 

## 2019-09-13 NOTE — ED Triage Notes (Signed)
Pt arrived from [facility] via [facility] c/o of fall. Per facility pt rolled out of bed an struck her head on nightstand. Pt then got up unassisted, dressed herself and asked facility to call 911. Pt presents with a hematoma over left eye. Denies LOC, neck or back pain or blood thinners. Placed on a c-spine precaution due to MOI. Pt A+Ox3 VSS

## 2019-09-13 NOTE — ED Provider Notes (Signed)
[facility] EMERGENCY DEPARTMENT Provider Note   CSN: [id number] Arrival date & time: 09/13/19  2157     History Chief Complaint  Patient presents with  . Fall    Tara Hurst is a 59 y.o. female.  Patient presents to the ED with a chief complaint of fall.  She is coming from [facility] assisted living.  She has hx of schizoaffective disorder.  She states that she rolled over in bed to turn off her light and rolled all the way out of the bed striking her head on the way down.  She is not anticoagulated.  She sustained a hematoma to the left forehead.  She did not pass out.  She denies any neck pain.  She denies any other injuries.  She denies any other associated problems.  The history is provided by the patient. No language interpreter was used.       Past Medical History:  Diagnosis Date  . Anemia, unspecified   . Chronic kidney disease, stage 3 (moderate)   . Generalized anxiety disorder   . Hyperosmolality and hypernatremia   . Hypertension   . Muscle weakness (generalized)   . Schizoaffective disorder [facility])     Patient Active Problem List   Diagnosis Date Noted  . 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 Malnutrition of moderate degree 06/14/2018  . AKI (acute kidney injury) ([place]) 06/06/2018  . Sepsis due to undetermined organism ([place]) 06/06/2018  . Tachycardia 12/03/2017  . Acute lower UTI 12/03/2017  . GAD (generalized anxiety disorder) 10/19/2017  . Anxiety   . UTI due to Klebsiella species   . Hypernatremia   . Acute encephalopathy 05/03/2017  . ARF (acute renal failure) ([place]) 05/03/2017  . HCAP (healthcare-associated pneumonia) 09/25/2013  . Hypertension 09/25/2013  . Abnormal lithium level in blood 09/25/2013    Past Surgical History:  Procedure Laterality Date  . NO PAST SURGERIES       OB History   No obstetric history on file.     Family History  Problem Relation Age of Onset  . Heart disease Mother   . Diabetic  kidney disease Mother   . Heart attack Mother   . Heart Problems Father     Social History   Tobacco Use  . Smoking status: Never Smoker  . Smokeless tobacco: Never Used  Substance Use Topics  . Alcohol use: No  . Drug use: No    Home Medications Prior to Admission medications   Medication Sig Start Date End Date Taking? Authorizing Provider  benztropine (COGENTIN) 0.5 MG tablet Take 1 tablet (0.5 mg total) by mouth 2 (two) times daily. 09/28/13   Kinnie Feil, MD  buPROPion (WELLBUTRIN XL) 150 MG 24 hr tablet Take 450 mg by mouth daily.     [provider]  clonazePAM (KLONOPIN) 0.5 MG tablet Take 1 tablet (0.5 mg total) by mouth 2 (two) times daily. 06/13/18   Thurnell Lose, MD  Lurasidone HCl (LATUDA) 60 MG TABS Take 60 mg by mouth daily.    [provider]  polyethylene glycol (MIRALAX) packet Take 17 g by mouth daily. 06/13/18   Thurnell Lose, MD  propranolol (INDERAL) 10 MG tablet Take 10 mg by mouth 2 (two) times daily.    [provider]  risperiDONE (RISPERDAL) 3 MG tablet Take 1 tablet (3 mg total) by mouth 2 (two) times daily. 12/04/17   Aline August, MD    Allergies    Patient  has no active allergies.  Review of Systems   Review of Systems  All other systems reviewed and are negative.   Physical Exam Updated Vital Signs BP (!) 139/109 (BP Location: Left Arm)   Pulse 80   Temp 98.6 F (37 C) (Oral)   Resp 14   SpO2 98%   Physical Exam Vitals and nursing note reviewed.  Constitutional:      General: She is not in acute distress.    Appearance: She is well-developed.  HENT:     Head: Normocephalic.     Comments: 3x3 cm hematoma to left forehead No other traumatic findings Eyes:     Conjunctiva/sclera: Conjunctivae normal.  Neck:     Comments: No cervical spine tenderness Normal pain free ROM No bony deformity or tenderness Cardiovascular:     Rate and Rhythm: Normal rate and regular rhythm.     Heart sounds:  No murmur.  Pulmonary:     Effort: Pulmonary effort is normal. No respiratory distress.     Breath sounds: Normal breath sounds.  Abdominal:     Palpations: Abdomen is soft.     Tenderness: There is no abdominal tenderness.  Musculoskeletal:        General: Normal range of motion.     Cervical back: Neck supple.  Skin:    General: Skin is warm and dry.  Neurological:     Mental Status: She is alert and oriented to person, place, and time.  Psychiatric:        Mood and Affect: Mood normal.        Behavior: Behavior normal.     ED Results / Procedures / Treatments   Labs (all labs ordered are listed, but only abnormal results are displayed) Labs Reviewed - No data to display  EKG None  Radiology CT Head Wo Contrast  Result Date: 09/13/2019 CLINICAL DATA:  Fall EXAM: CT HEAD WITHOUT CONTRAST TECHNIQUE: Contiguous axial images were obtained from the base of the skull through the vertex without intravenous contrast. COMPARISON:  06/06/2018 FINDINGS: Brain: There is no acute intracranial hemorrhage, mass effect, or edema. Gray-white differentiation is preserved. There is no extra-axial fluid collection. Ventricles and sulci are within normal limits in size and configuration. Vascular: No hyperdense vessel or unexpected calcification. Skull: Calvarium is unremarkable. Sinuses/Orbits: No acute finding. Other: Left frontal scalp soft tissue swelling. IMPRESSION: No evidence of acute intracranial injury. Electronically Signed   By: Macy Mis M.D.   On: 09/13/2019 22:48    Procedures Procedures (including critical care time)  Medications Ordered in ED Medications - No data to display  ED Course  I have reviewed the triage vital signs and the nursing notes.  Pertinent labs & imaging results that were available during my care of the patient were reviewed by me and considered in my medical decision making (see chart for details).    MDM Rules/Calculators/A&P                        Patient here with head injury.  She rolled off her bed and hit her head on the way down.  She has a hematoma to the left forehead.  She has no bleeding.  She has no evidence of any other injuries.  CT ordered given the size of the hematoma.  She does not have any neck pain, I doubt any cervical spine injury.  CT head is negative.  Patient remains well-appearing.  Will discharge home.   Final  Clinical Impression(s) / ED Diagnoses Final diagnoses:  Fall, initial encounter  Hematoma  Injury of head, initial encounter    Rx / DC Orders ED Discharge Orders    None       Montine Circle, PA-C 09/13/19 2352    Sherwood Gambler, MD 09/14/19 1526

## 2019-09-14 NOTE — ED Notes (Signed)
Gave report to [facility] [phone]

## 2020-09-08 ENCOUNTER — Encounter (HOSPITAL_COMMUNITY): Payer: Self-pay | Admitting: Emergency Medicine

## 2020-09-08 ENCOUNTER — Emergency Department (HOSPITAL_COMMUNITY)
Admission: EM | Admit: 2020-09-08 | Discharge: 2020-09-08 | Disposition: A | Discharge status: Home / Self Care | Payer: Medicaid Other | Attending: Emergency Medicine | Admitting: Emergency Medicine

## 2020-09-08 DIAGNOSIS — N183 Chronic kidney disease, stage 3 unspecified: Secondary | ICD-10-CM | POA: Diagnosis not present

## 2020-09-08 DIAGNOSIS — I129 Hypertensive chronic kidney disease with stage 1 through stage 4 chronic kidney disease, or unspecified chronic kidney disease: Secondary | ICD-10-CM | POA: Diagnosis not present

## 2020-09-08 DIAGNOSIS — Z79899 Other long term (current) drug therapy: Secondary | ICD-10-CM | POA: Diagnosis not present

## 2020-09-08 DIAGNOSIS — S01112A Laceration without foreign body of left eyelid and periocular area, initial encounter: Secondary | ICD-10-CM | POA: Insufficient documentation

## 2020-09-08 DIAGNOSIS — W19XXXA Unspecified fall, initial encounter: Secondary | ICD-10-CM

## 2020-09-08 DIAGNOSIS — W01198A Fall on same level from slipping, tripping and stumbling with subsequent striking against other object, initial encounter: Secondary | ICD-10-CM | POA: Diagnosis not present

## 2020-09-08 DIAGNOSIS — S0993XA Unspecified injury of face, initial encounter: Secondary | ICD-10-CM | POA: Diagnosis present

## 2020-09-08 DIAGNOSIS — S0181XA Laceration without foreign body of other part of head, initial encounter: Secondary | ICD-10-CM

## 2020-09-08 LAB — BASIC METABOLIC PANEL
Anion gap: 8 (ref 5–15)
BUN: 17 mg/dL (ref 6–20)
CO2: 27 mmol/L (ref 22–32)
Calcium: 10.4 mg/dL — ABNORMAL HIGH (ref 8.9–10.3)
Chloride: 107 mmol/L (ref 98–111)
Creatinine, Ser: 1.56 mg/dL — ABNORMAL HIGH (ref 0.44–1.00)
GFR, Estimated: 38 mL/min — ABNORMAL LOW (ref >60)
Glucose, Bld: 99 mg/dL (ref 70–99)
Potassium: 4.3 mmol/L (ref 3.5–5.1)
Sodium: 142 mmol/L (ref 135–145)

## 2020-09-08 LAB — CBC
HCT: 36.8 % (ref 36.0–46.0)
Hemoglobin: 11.5 g/dL — ABNORMAL LOW (ref 12.0–15.0)
MCH: 26.4 pg (ref 26.0–34.0)
MCHC: 31.3 g/dL (ref 30.0–36.0)
MCV: 84.6 fL (ref 80.0–100.0)
Platelets: 278 10*3/uL (ref 150–400)
RBC: 4.35 MIL/uL (ref 3.87–5.11)
RDW: 14 % (ref 11.5–15.5)
WBC: 8.4 10*3/uL (ref 4.0–10.5)
nRBC: 0 % (ref 0.0–0.2)

## 2020-09-08 NOTE — ED Notes (Signed)
[facility] called by Kori Goins pt is number 4 on the list 

## 2020-09-08 NOTE — ED Triage Notes (Signed)
Patient BIB [facility] from [facility] after unwitnessed fall. Patient states she was trying to get out of bed when she fell and hit her head on either the nightstand or floor. Is not anticoagulated. Patient alert, oriented, and in no apparent distress at this time.

## 2020-09-08 NOTE — ED Provider Notes (Signed)
[facility] EMERGENCY DEPARTMENT Provider Note   CSN: [id number] Arrival date & time: 09/08/20  1205     History Chief Complaint  Patient presents with  . Fall    Tara Hurst is a 60 y.o. female.  60 year old female with complaint of laceration to the face after rolling out of bed today. Denies LOC, headache, nausea, vomiting or any other injuries or concerns. Patient is not anticoagulated.         Past Medical History:  Diagnosis Date  . Anemia, unspecified   . Chronic kidney disease, stage 3 (moderate)   . Generalized anxiety disorder   . Hyperosmolality and hypernatremia   . Hypertension   . Muscle weakness (generalized)   . Schizoaffective disorder [facility])     Patient Active Problem List   Diagnosis Date Noted  . 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 Malnutrition of moderate degree 06/14/2018  . AKI (acute kidney injury) ([place]) 06/06/2018  . Sepsis due to undetermined organism ([place]) 06/06/2018  . Tachycardia 12/03/2017  . Acute lower UTI 12/03/2017  . GAD (generalized anxiety disorder) 10/19/2017  . Anxiety   . UTI due to Klebsiella species   . Hypernatremia   . Acute encephalopathy 05/03/2017  . ARF (acute renal failure) ([place]) 05/03/2017  . HCAP (healthcare-associated pneumonia) 09/25/2013  . Hypertension 09/25/2013  . Abnormal lithium level in blood 09/25/2013    Past Surgical History:  Procedure Laterality Date  . NO PAST SURGERIES       OB History   No obstetric history on file.     Family History  Problem Relation Age of Onset  . Heart disease Mother   . Diabetic kidney disease Mother   . Heart attack Mother   . Heart Problems Father     Social History   Tobacco Use  . Smoking status: Never Smoker  . Smokeless tobacco: Never Used  Vaping Use  . Vaping Use: Never used  Substance Use Topics  . Alcohol use: No  . Drug use: No    Home Medications Prior to Admission medications   Medication Sig Start Date End  Date Taking? Authorizing Provider  benztropine (COGENTIN) 0.5 MG tablet Take 1 tablet (0.5 mg total) by mouth 2 (two) times daily. 09/28/13   Kinnie Feil, MD  buPROPion (WELLBUTRIN XL) 150 MG 24 hr tablet Take 450 mg by mouth daily.     [provider]  clonazePAM (KLONOPIN) 0.5 MG tablet Take 1 tablet (0.5 mg total) by mouth 2 (two) times daily. 06/13/18   Thurnell Lose, MD  Lurasidone HCl (LATUDA) 60 MG TABS Take 60 mg by mouth daily.    [provider]  polyethylene glycol (MIRALAX) packet Take 17 g by mouth daily. 06/13/18   Thurnell Lose, MD  propranolol (INDERAL) 10 MG tablet Take 10 mg by mouth 2 (two) times daily.    [provider]  risperiDONE (RISPERDAL) 3 MG tablet Take 1 tablet (3 mg total) by mouth 2 (two) times daily. 12/04/17   Aline August, MD    Allergies    Patient has no known allergies.  Review of Systems   Review of Systems  Constitutional: Negative for fever.  Eyes: Negative for visual disturbance.  Gastrointestinal: Negative for nausea and vomiting.  Musculoskeletal: Negative for back pain, neck pain and neck stiffness.  Skin: Positive for wound.  Allergic/Immunologic: Negative for immunocompromised state.  Neurological: Negative for dizziness, speech difficulty, weakness, light-headedness, numbness and headaches.  Hematological: Does not bruise/bleed easily.  Psychiatric/Behavioral: Negative for confusion.  All other systems reviewed and are negative.   Physical Exam Updated Vital Signs BP 130/87 (BP Location: Left Arm)   Pulse 71   Temp 97.8 F (36.6 C) (Oral)   Resp 17   SpO2 100%   Physical Exam Vitals and nursing note reviewed.  Constitutional:      General: She is not in acute distress.    Appearance: She is well-developed. She is not diaphoretic.  HENT:     Head: Normocephalic.      Nose: Nose normal.     Mouth/Throat:     Mouth: Mucous membranes are moist.  Eyes:     Extraocular Movements:  Extraocular movements intact.     Pupils: Pupils are equal, round, and reactive to light.  Cardiovascular:     Rate and Rhythm: Normal rate and regular rhythm.     Pulses: Normal pulses.     Heart sounds: Normal heart sounds.  Pulmonary:     Effort: Pulmonary effort is normal.     Breath sounds: Normal breath sounds.  Abdominal:     Palpations: Abdomen is soft.     Tenderness: There is no abdominal tenderness.  Musculoskeletal:        General: No swelling, tenderness, deformity or signs of injury. Normal range of motion.     Cervical back: Normal range of motion and neck supple. No tenderness.  Skin:    General: Skin is warm and dry.     Findings: No erythema or rash.  Neurological:     Mental Status: She is alert and oriented to person, place, and time.  Psychiatric:        Behavior: Behavior normal.     ED Results / Procedures / Treatments   Labs (all labs ordered are listed, but only abnormal results are displayed) Labs Reviewed  BASIC METABOLIC PANEL - Abnormal; Notable for the following components:      Result Value   Creatinine, Ser 1.56 (*)    Calcium 10.4 (*)    GFR, Estimated 38 (*)    All other components within normal limits  CBC - Abnormal; Notable for the following components:   Hemoglobin 11.5 (*)    All other components within normal limits  URINALYSIS, ROUTINE W REFLEX MICROSCOPIC  CBG MONITORING, ED    EKG None  Radiology No results found.  Procedures .Marland KitchenLaceration Repair  Date/Time: 09/08/2020 4:12 PM Performed by: Tacy Learn, PA-C Authorized by: Tacy Learn, PA-C   Consent:    Consent obtained:  Verbal   Consent given by:  Patient   Risks discussed:  Infection, need for additional repair, pain, poor cosmetic result and poor wound healing   Alternatives discussed:  No treatment and delayed treatment Universal protocol:    Procedure explained and questions answered to patient or proxy's satisfaction: yes     Relevant documents  present and verified: yes     Test results available: yes     Imaging studies available: yes     Required blood products, implants, devices, and special equipment available: yes     Site/side marked: yes     Immediately prior to procedure, a time out was called: yes     Patient identity confirmed:  Verbally with patient Anesthesia:    Anesthesia method:  None Laceration details:    Location:  Face   Face location:  L eyebrow   Length (cm):  0.5   Depth (mm):  2 Exploration:    Wound exploration: entire depth of wound visualized     Wound extent: no foreign bodies/material noted and no muscle damage noted     Contaminated: no   Treatment:    Area cleansed with:  Saline   Amount of cleaning:  Standard   Irrigation solution:  Sterile saline   Debridement:  None Skin repair:    Repair method:  Steri-Strips   Number of Steri-Strips:  1 Approximation:    Approximation:  Close Repair type:    Repair type:  Simple Post-procedure details:    Dressing:  Open (no dressing)   Procedure completion:  Tolerated well, no immediate complications     Medications Ordered in ED Medications - No data to display  ED Course  I have reviewed the triage vital signs and the nursing notes.  Pertinent labs & imaging results that were available during my care of the patient were reviewed by me and considered in my medical decision making (see chart for details).  Clinical Course as of 09/08/20 1614  Wed Sep 09, 6123  [id number] 60 year old female sent by nursing facility via EMS for evaluation after falling out of bed today as above.  On exam is found to have a small laceration over her left eyebrow which was cleaned and closed with 1 Steri-Strip.  Patient denies loss of consciousness, review of her medications, she is not anticoagulated.  Patient is alert and acting appropriately.  Plan is to discharge back to facility. [LM]    Clinical Course User Index [LM] Roque Lias   MDM  Rules/Calculators/A&P                          Final Clinical Impression(s) / ED Diagnoses Final diagnoses:  Fall, initial encounter  Facial laceration, initial encounter    Rx / DC Orders ED Discharge Orders    None       Tacy Learn, PA-C 09/08/20 1614    Blanchie Dessert, MD 09/11/20 2256

## 2020-09-08 NOTE — Discharge Instructions (Addendum)
Wound check in 2 days.

## 2021-01-11 ENCOUNTER — Other Ambulatory Visit (HOSPITAL_COMMUNITY): Payer: Self-pay | Admitting: Pulmonary Disease

## 2021-01-11 ENCOUNTER — Other Ambulatory Visit: Payer: Self-pay | Admitting: Pulmonary Disease

## 2021-01-11 DIAGNOSIS — G8191 Hemiplegia, unspecified affecting right dominant side: Secondary | ICD-10-CM

## 2021-01-11 DIAGNOSIS — M79652 Pain in left thigh: Secondary | ICD-10-CM

## 2021-01-11 DIAGNOSIS — M25551 Pain in right hip: Secondary | ICD-10-CM

## 2021-01-11 DIAGNOSIS — M79651 Pain in right thigh: Secondary | ICD-10-CM

## 2021-01-31 ENCOUNTER — Inpatient Hospital Stay (HOSPITAL_COMMUNITY): Payer: Medicaid Other

## 2021-01-31 ENCOUNTER — Inpatient Hospital Stay (HOSPITAL_COMMUNITY)
Admission: EM | Admit: 2021-01-31 | Discharge: 2021-02-05 | DRG: 054 | Disposition: A | Discharge status: Hospice | Payer: Medicaid Other | Attending: Internal Medicine | Admitting: Internal Medicine

## 2021-01-31 ENCOUNTER — Ambulatory Visit (HOSPITAL_COMMUNITY)
Admission: RE | Admit: 2021-01-31 | Discharge: 2021-01-31 | Disposition: A | Discharge status: Home / Self Care | Payer: Medicaid Other | Source: Ambulatory Visit | Attending: Pulmonary Disease | Admitting: Pulmonary Disease

## 2021-01-31 ENCOUNTER — Emergency Department (HOSPITAL_COMMUNITY): Payer: Medicaid Other

## 2021-01-31 ENCOUNTER — Encounter (HOSPITAL_COMMUNITY): Payer: Self-pay | Admitting: *Deleted

## 2021-01-31 ENCOUNTER — Other Ambulatory Visit: Payer: Self-pay

## 2021-01-31 ENCOUNTER — Encounter (HOSPITAL_COMMUNITY): Payer: Self-pay

## 2021-01-31 DIAGNOSIS — M79652 Pain in left thigh: Secondary | ICD-10-CM

## 2021-01-31 DIAGNOSIS — G8191 Hemiplegia, unspecified affecting right dominant side: Secondary | ICD-10-CM

## 2021-01-31 DIAGNOSIS — M25552 Pain in left hip: Secondary | ICD-10-CM

## 2021-01-31 DIAGNOSIS — I10 Essential (primary) hypertension: Secondary | ICD-10-CM | POA: Diagnosis present

## 2021-01-31 DIAGNOSIS — Z841 Family history of disorders of kidney and ureter: Secondary | ICD-10-CM | POA: Diagnosis not present

## 2021-01-31 DIAGNOSIS — E870 Hyperosmolality and hypernatremia: Secondary | ICD-10-CM | POA: Diagnosis present

## 2021-01-31 DIAGNOSIS — G9389 Other specified disorders of brain: Secondary | ICD-10-CM | POA: Diagnosis not present

## 2021-01-31 DIAGNOSIS — Z79899 Other long term (current) drug therapy: Secondary | ICD-10-CM

## 2021-01-31 DIAGNOSIS — M25551 Pain in right hip: Secondary | ICD-10-CM

## 2021-01-31 DIAGNOSIS — F259 Schizoaffective disorder, unspecified: Secondary | ICD-10-CM | POA: Diagnosis present

## 2021-01-31 DIAGNOSIS — I6789 Other cerebrovascular disease: Secondary | ICD-10-CM | POA: Diagnosis present

## 2021-01-31 DIAGNOSIS — I619 Nontraumatic intracerebral hemorrhage, unspecified: Secondary | ICD-10-CM | POA: Diagnosis present

## 2021-01-31 DIAGNOSIS — R000 Tachycardia, unspecified: Secondary | ICD-10-CM | POA: Diagnosis present

## 2021-01-31 DIAGNOSIS — M6281 Muscle weakness (generalized): Secondary | ICD-10-CM | POA: Diagnosis present

## 2021-01-31 DIAGNOSIS — F411 Generalized anxiety disorder: Secondary | ICD-10-CM | POA: Diagnosis present

## 2021-01-31 DIAGNOSIS — I129 Hypertensive chronic kidney disease with stage 1 through stage 4 chronic kidney disease, or unspecified chronic kidney disease: Secondary | ICD-10-CM | POA: Diagnosis present

## 2021-01-31 DIAGNOSIS — G9340 Encephalopathy, unspecified: Secondary | ICD-10-CM | POA: Diagnosis present

## 2021-01-31 DIAGNOSIS — Z7982 Long term (current) use of aspirin: Secondary | ICD-10-CM | POA: Diagnosis not present

## 2021-01-31 DIAGNOSIS — M79651 Pain in right thigh: Secondary | ICD-10-CM | POA: Insufficient documentation

## 2021-01-31 DIAGNOSIS — Z7189 Other specified counseling: Secondary | ICD-10-CM | POA: Diagnosis not present

## 2021-01-31 DIAGNOSIS — N1832 Chronic kidney disease, stage 3b: Secondary | ICD-10-CM | POA: Diagnosis present

## 2021-01-31 DIAGNOSIS — D496 Neoplasm of unspecified behavior of brain: Principal | ICD-10-CM | POA: Diagnosis present

## 2021-01-31 DIAGNOSIS — Z20822 Contact with and (suspected) exposure to covid-19: Secondary | ICD-10-CM | POA: Diagnosis present

## 2021-01-31 DIAGNOSIS — N189 Chronic kidney disease, unspecified: Secondary | ICD-10-CM

## 2021-01-31 DIAGNOSIS — C719 Malignant neoplasm of brain, unspecified: Secondary | ICD-10-CM | POA: Diagnosis present

## 2021-01-31 DIAGNOSIS — Z8249 Family history of ischemic heart disease and other diseases of the circulatory system: Secondary | ICD-10-CM | POA: Diagnosis not present

## 2021-01-31 DIAGNOSIS — Z515 Encounter for palliative care: Secondary | ICD-10-CM | POA: Diagnosis not present

## 2021-01-31 DIAGNOSIS — D649 Anemia, unspecified: Secondary | ICD-10-CM | POA: Diagnosis present

## 2021-01-31 DIAGNOSIS — E663 Overweight: Secondary | ICD-10-CM | POA: Diagnosis present

## 2021-01-31 DIAGNOSIS — F250 Schizoaffective disorder, bipolar type: Secondary | ICD-10-CM | POA: Diagnosis not present

## 2021-01-31 DIAGNOSIS — G9349 Other encephalopathy: Secondary | ICD-10-CM | POA: Diagnosis present

## 2021-01-31 DIAGNOSIS — Z66 Do not resuscitate: Secondary | ICD-10-CM | POA: Diagnosis present

## 2021-01-31 DIAGNOSIS — R4701 Aphasia: Secondary | ICD-10-CM | POA: Diagnosis present

## 2021-01-31 DIAGNOSIS — G936 Cerebral edema: Secondary | ICD-10-CM | POA: Diagnosis present

## 2021-01-31 DIAGNOSIS — E860 Dehydration: Secondary | ICD-10-CM | POA: Diagnosis present

## 2021-01-31 LAB — RESP PANEL BY RT-PCR (FLU A&B, COVID) ARPGX2
Influenza A by PCR: NEGATIVE
Influenza B by PCR: NEGATIVE
SARS Coronavirus 2 by RT PCR: NEGATIVE

## 2021-01-31 LAB — PROTIME-INR
INR: 0.9 (ref 0.8–1.2)
Prothrombin Time: 12.5 seconds (ref 11.4–15.2)

## 2021-01-31 LAB — COMPREHENSIVE METABOLIC PANEL
ALT: 14 U/L (ref 0–44)
AST: 15 U/L (ref 15–41)
Albumin: 4.7 g/dL (ref 3.5–5.0)
Alkaline Phosphatase: 120 U/L (ref 38–126)
Anion gap: 10 (ref 5–15)
BUN: 17 mg/dL (ref 6–20)
CO2: 27 mmol/L (ref 22–32)
Calcium: 10.8 mg/dL — ABNORMAL HIGH (ref 8.9–10.3)
Chloride: 107 mmol/L (ref 98–111)
Creatinine, Ser: 1.52 mg/dL — ABNORMAL HIGH (ref 0.44–1.00)
GFR, Estimated: 39 mL/min — ABNORMAL LOW (ref >60)
Glucose, Bld: 86 mg/dL (ref 70–99)
Potassium: 4.1 mmol/L (ref 3.5–5.1)
Sodium: 144 mmol/L (ref 135–145)
Total Bilirubin: 0.6 mg/dL (ref 0.3–1.2)
Total Protein: 8.3 g/dL — ABNORMAL HIGH (ref 6.5–8.1)

## 2021-01-31 LAB — CBC
HCT: 39.6 % (ref 36.0–46.0)
Hemoglobin: 12.7 g/dL (ref 12.0–15.0)
MCH: 27.1 pg (ref 26.0–34.0)
MCHC: 32.1 g/dL (ref 30.0–36.0)
MCV: 84.6 fL (ref 80.0–100.0)
Platelets: 236 10*3/uL (ref 150–400)
RBC: 4.68 MIL/uL (ref 3.87–5.11)
RDW: 13.4 % (ref 11.5–15.5)
WBC: 9.8 10*3/uL (ref 4.0–10.5)
nRBC: 0 % (ref 0.0–0.2)

## 2021-01-31 MED ORDER — LABETALOL HCL 5 MG/ML IV SOLN
10.0000 mg | INTRAVENOUS | Status: DC | PRN
  Administered 2021-01-31 – 2021-02-04 (×6): 10 mg via INTRAVENOUS
  Filled 2021-01-31 (×6): qty 4

## 2021-01-31 MED ORDER — SODIUM CHLORIDE 0.9 % IV SOLN: INTRAVENOUS | Status: DC

## 2021-01-31 MED ORDER — DEXAMETHASONE SODIUM PHOSPHATE 10 MG/ML IJ SOLN
6.0000 mg | Freq: Every day | INTRAMUSCULAR | Status: DC
  Administered 2021-02-01 – 2021-02-02 (×2): 6 mg via INTRAVENOUS
  Filled 2021-01-31 (×2): qty 1

## 2021-01-31 MED ORDER — DEXAMETHASONE SODIUM PHOSPHATE 10 MG/ML IJ SOLN
10.0000 mg | Freq: Once | INTRAMUSCULAR | Status: AC
  Administered 2021-01-31: 10 mg via INTRAVENOUS
  Filled 2021-01-31: qty 1

## 2021-01-31 MED ORDER — HYDRALAZINE HCL 20 MG/ML IJ SOLN
10.0000 mg | INTRAMUSCULAR | Status: DC | PRN
Start: 2021-01-31 — End: 2021-02-05
  Administered 2021-02-04 (×2): 10 mg via INTRAVENOUS
  Filled 2021-01-31 (×3): qty 1

## 2021-01-31 MED ORDER — SODIUM CHLORIDE 0.9% FLUSH
3.0000 mL | Freq: Two times a day (BID) | INTRAVENOUS | Status: DC
  Administered 2021-02-01 – 2021-02-04 (×8): 3 mL via INTRAVENOUS

## 2021-01-31 MED ORDER — GADOBUTROL 1 MMOL/ML IV SOLN
6.5000 mL | Freq: Once | INTRAVENOUS | Status: AC | PRN
  Administered 2021-01-31: 6.5 mL via INTRAVENOUS

## 2021-01-31 NOTE — ED Provider Notes (Signed)
Tara Hurst Provider Note   CSN: [id number] Arrival date & time: 01/31/21  1422     History Chief Complaint  Patient presents with   abnormal CT    Tara Hurst is a 60 y.o. female. Level 5 caveat secondary to patient having speech problems, unclear if she is completely oriented, and no next of kin or family or friends are here at bedside and she does not identify any. HPI 60 year old female history of generalized anxiety disorder, CKD, anemia, hypertension, schizoaffective disorder presents today with reports that she has an abnormal CT scan.  Received call back from Wells Guiles at her assisted living care facility which is helpful [facility].  She reports that patient has had 2 weeks of leaning to the right.  She has had some difficulty speaking with appears to be more hesitancy and she has fallen 1 time.  She normally ambulates with a walker.  He has not had any change in her ability to walk. Huel Coventry will follow [facility] assisted living care facility reports that the patient's contact is [name] her phone number is [phone]     Past Medical History:  Diagnosis Date   Anemia, unspecified    Chronic kidney disease, stage 3 (moderate)    Generalized anxiety disorder    Hyperosmolality and hypernatremia    Hypertension    Muscle weakness (generalized)    Schizoaffective disorder [facility])     Patient Active Problem List   Diagnosis Date Noted   Radial nerve palsy, 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   Malnutrition of moderate degree 06/14/2018   AKI (acute kidney injury) ([place]) 06/06/2018   Sepsis due to undetermined organism ([place]) 06/06/2018   Tachycardia 12/03/2017   Acute lower UTI 12/03/2017   GAD (generalized anxiety disorder) 10/19/2017   Anxiety    UTI due to Klebsiella species    Hypernatremia    Acute encephalopathy 05/03/2017   ARF (acute renal failure) ([place]) 05/03/2017   HCAP (healthcare-associated pneumonia) 09/25/2013   Hypertension  09/25/2013   Abnormal lithium level in blood 09/25/2013    Past Surgical History:  Procedure Laterality Date   NO PAST SURGERIES       OB History   No obstetric history on file.     Family History  Problem Relation Age of Onset   Heart disease Mother    Diabetic kidney disease Mother    Heart attack Mother    Heart Problems Father     Social History   Tobacco Use   Smoking status: Never   Smokeless tobacco: Never  Vaping Use   Vaping Use: Never used  Substance Use Topics   Alcohol use: No   Drug use: No    Home Medications Prior to Admission medications   Medication Sig Start Date End Date Taking? Authorizing Provider  benztropine (COGENTIN) 0.5 MG tablet Take 1 tablet (0.5 mg total) by mouth 2 (two) times daily. 09/28/13   Kinnie Feil, MD  buPROPion (WELLBUTRIN XL) 150 MG 24 hr tablet Take 450 mg by mouth daily.     [provider]  clonazePAM (KLONOPIN) 0.5 MG tablet Take 1 tablet (0.5 mg total) by mouth 2 (two) times daily. 06/13/18   Thurnell Lose, MD  Lurasidone HCl (LATUDA) 60 MG TABS Take 60 mg by mouth daily.    [provider]  polyethylene glycol (MIRALAX) packet Take 17 g by mouth daily. 06/13/18   Thurnell Lose, MD  propranolol (INDERAL) 10 MG tablet Take 10 mg by  mouth 2 (two) times daily.    [provider]  risperiDONE (RISPERDAL) 3 MG tablet Take 1 tablet (3 mg total) by mouth 2 (two) times daily. 12/04/17   Aline August, MD    Allergies    Patient has no known allergies.  Review of Systems   Review of Systems  Unable to perform ROS: Other   Physical Exam Updated Vital Signs BP (!) 150/101   Pulse 90   Temp 98.8 F (37.1 C) (Oral)   Resp 17   SpO2 99%   Physical Exam Vitals and nursing note reviewed.  Constitutional:      General: She is not in acute distress.    Appearance: Normal appearance.  HENT:     Head: Normocephalic and atraumatic.     Right Ear: External ear normal.     Left Ear:  External ear normal.     Nose: Nose normal.     Mouth/Throat:     Pharynx: Oropharynx is clear.  Eyes:     Extraocular Movements: Extraocular movements intact.     Pupils: Pupils are equal, round, and reactive to light.  Cardiovascular:     Rate and Rhythm: Normal rate and regular rhythm.  Pulmonary:     Effort: Pulmonary effort is normal.     Breath sounds: Normal breath sounds.  Abdominal:     General: Abdomen is flat. Bowel sounds are normal.     Palpations: Abdomen is soft.     Tenderness: There is no abdominal tenderness.  Musculoskeletal:        General: Normal range of motion.     Cervical back: Normal range of motion.  Skin:    General: Skin is warm.     Capillary Refill: Capillary refill takes less than 2 seconds.  Neurological:     Mental Status: She is alert.     Cranial Nerves: No cranial nerve deficit.     Motor: Weakness present.     Coordination: Coordination abnormal.     Comments: Patient with right palmar drift and right leg appears weaker than left leg  Psychiatric:        Mood and Affect: Mood normal.        Behavior: Behavior normal.    ED Results / Procedures / Treatments   Labs (all labs ordered are listed, but only abnormal results are displayed) Labs Reviewed  RESP PANEL BY RT-PCR (FLU A&B, COVID) [id number]  CBC  COMPREHENSIVE METABOLIC PANEL  PROTIME-INR    EKG None  Radiology CT HEAD WO CONTRAST  Result Date: 01/31/2021 CLINICAL DATA:  Headache when lying down.  Right hemiparesis. EXAM: CT HEAD WITHOUT CONTRAST TECHNIQUE: Contiguous axial images were obtained from the base of the skull through the vertex without intravenous contrast. COMPARISON:  09/13/2019 FINDINGS: Brain: No abnormality seen affecting the brainstem or cerebellum. Right cerebral hemisphere is normal. Within the left parietal lobe, there is a cystic mass lesion measuring at least 4 cm in diameter. There is considerable regional vasogenic edema. Hemorrhage associated with the  inferior margin the lesion shows an intraparenchymal hematoma measuring 2.5 x 1.5 cm in diameter. There is mass-effect upon the atrium and occipital horn of the left lateral ventricle. No hydrocephalus. No extra-axial fluid collection. Vascular: There is atherosclerotic calcification of the major vessels at the base of the brain. Skull: Normal Sinuses/Orbits: Clear/normal Other: None IMPRESSION: 4 cm cystic necrotic mass in the left parietal lobe with regional vasogenic edema and hemorrhage along the inferior margin of the mass,  with the hematoma measuring 2.5 x 1.5 cm in size. 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 Metastatic disease is also possible. No second lesion is identified. Call report in progress. Electronically Signed   By: Nelson Chimes M.D.   On: 01/31/2021 14:04   DG HIP UNILAT WITH PELVIS 2-3 VIEWS LEFT  Result Date: 01/31/2021 CLINICAL DATA:  left hip pain and thigh pain; right hip pain and thigh pain EXAM: DG HIP (WITH OR WITHOUT PELVIS) 2-3V LEFT; DG HIP (WITH OR WITHOUT PELVIS) 2-3V RIGHT COMPARISON:  None FINDINGS: There is no acute fracture or dislocation. There is mild bilateral hip degenerative change. Mild findings of osteitis pubis. 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 Electronically Signed   By: Maurine Simmering   On: 01/31/2021 14:02   DG HIP UNILAT WITH PELVIS 2-3 VIEWS RIGHT  Result Date: 01/31/2021 CLINICAL DATA:  left hip pain and thigh pain; right hip pain and thigh pain EXAM: DG HIP (WITH OR WITHOUT PELVIS) 2-3V LEFT; DG HIP (WITH OR WITHOUT PELVIS) 2-3V RIGHT COMPARISON:  None FINDINGS: There is no acute fracture or dislocation. There is mild bilateral hip degenerative change. Mild findings of osteitis pubis. 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 Electronically Signed   By: Maurine Simmering   On: 01/31/2021 14:02    Procedures .Critical Care  Date/Time: 01/31/2021 5:39 PM Performed by: Pattricia Boss,  MD Authorized by: Pattricia Boss, MD   Critical care provider statement:    Critical care time (minutes):  45   Critical care end time:  01/31/2021 5:39 PM   Critical care was necessary to treat or prevent imminent or life-threatening deterioration of the following conditions:  CNS failure or compromise   Critical care was time spent personally by me on the following activities:  Discussions with consultants, evaluation of patient's response to treatment, examination of patient, ordering and performing treatments and interventions, ordering and review of laboratory studies, ordering and review of radiographic studies, pulse oximetry, re-evaluation of patient's condition, obtaining history from patient or surrogate and review of old charts   Medications Ordered in ED Medications  dexamethasone (DECADRON) injection 10 mg (has no administration in time range)    ED Course  I have reviewed the triage vital signs and the nursing notes.  Pertinent labs & imaging results that were available during my care of the patient were reviewed by me and considered in my medical decision making (see chart for details).    MDM Rules/Calculators/A&P                           Discussed care with Dr. Grandville Silos, on-call for neurosurgery.  He advises that patient should be admitted to medicine service.  Advises patient will need CT chest abdomen pelvis, MRI brain with and without contrast which can be obtained as inpatient. Plan consult to hospitalist for admission. Discussed with Dr. Sabino Gasser who will see for admission 1- New brain mass/hemorrhage- primary tumor vs met-decadron given here.  Neurosurgery consultation obtained. 2- ckd-creatinine 1.52 today here which is stable from first prior of 1.56 in March 3 patient with history of schizoaffective disorder.  Today she seems to be a particularly poor historian.  It is difficult to tell if this is secondary to #1 or history of schizoaffective disorder.  Care  discussed with her social worker who will be available to assist with her medical decision making  Final Clinical Impression(s) / ED Diagnoses Final  diagnoses:  Brain mass  Chronic kidney disease, unspecified CKD stage    Rx / DC Orders ED Discharge Orders     None        Pattricia Boss, MD 01/31/21 1739

## 2021-01-31 NOTE — Assessment & Plan Note (Signed)
-   last labs from 2020 but renal function then appeared to be consistent with a baseline creatinine of 1.6 - 1.7 - currently 1.52 and likely baseline; baseline GFR appears to be ~33 - will need to split up imaging studies and watch renal fxn inbetween contrast doses - start on NS; watch Na as currently 144 on admission - BMP this evening and in am

## 2021-01-31 NOTE — Hospital Course (Signed)
Ms. Tara Hurst is a 60 yo female with PMH CKD3b, GAD, anemia, HTN, schizoaffective disorder who resides at a group home ([facility]) and was sent to the ER due to difficulty ambulating over the past 2 weeks.  She has been reported to be leaning to the right and has fallen.  There was also reports that her speech has become more difficult to understand. The patient is unable to provide any meaningful collateral information.  She is able to answer her name and basic yes/no questions but cannot answer open-ended questions and mostly stutters when trying to answer. She has no reported next of kin or family. Izora Ribas is the contact at the group home and has stated that the patient's closest friend is [name], [phone].  She was attempted to be contacted while patient was in the ER but this was unsuccessful.  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  Concern is for an underlying malignant brain tumor such as glioblastoma. She was treated with Decadron 10 mg IV in the ER.  Case was discussed with neurosurgery and she was recommended for transfer to [facility] for further work-up and evaluation.  The patient mostly is able to state that her right side feels more numb when compared to touch on her left side.  Otherwise, review of systems is difficult to elicit nor any other symptoms from the patient.

## 2021-01-31 NOTE — ED Notes (Signed)
Patient to MRI via stretcher.

## 2021-01-31 NOTE — Assessment & Plan Note (Signed)
-   Previous baseline also appears to be around 11 g/dL - Currently 12.7 g/dL on admission - Follow-up CBC in a.m., especially after fluids overnight

## 2021-01-31 NOTE — ED Triage Notes (Signed)
Pt sent to ED d/t abnormal CT scan. CT was performed today.

## 2021-01-31 NOTE — H&P (Signed)
History and Physical    Tara Hurst  [id number]  DOB: [birth year]  DOA: 01/31/2021  PCP: [name], MD Patient coming from: group home  Chief Complaint: fell; leaning to right; speech change  HPI:  Tara Hurst is a 60 yo female with PMH CKD3b, GAD, anemia, HTN, schizoaffective disorder who resides at a group home ([facility]) and was sent to the ER due to difficulty ambulating over the past 2 weeks.  She has been reported to be leaning to the right and has fallen.  There was also reports that her speech has become more difficult to understand. The patient is unable to provide any meaningful collateral information.  She is able to answer her name and basic yes/no questions but cannot answer open-ended questions and mostly stutters when trying to answer. She has no reported next of kin or family. Izora Ribas is the contact at the group home and has stated that the patient's closest friend is [name], [phone].  She was attempted to be contacted while patient was in the ER but this was unsuccessful.  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  Concern is for an underlying malignant brain tumor such as glioblastoma. She was treated with Decadron 10 mg IV in the ER.  Case was discussed with neurosurgery and she was recommended for transfer to [facility] for further work-up and evaluation.  The patient mostly is able to state that her right side feels more numb when compared to touch on her left side.  Otherwise, review of systems is difficult to elicit nor any other symptoms from the patient.  I have personally briefly reviewed patient's old medical records in [facility] and discussed patient with the ER provider when appropriate/indicated.  Assessment/Plan: * Brain mass - per [facility] in ER on 01/31/21: "4 cm  cystic necrotic mass in the left parietal lobe with regional vasogenic edema and hemorrhage along the inferior margin of the mass, with the hematoma measuring 2.5 x 1.5 cm in size. 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 Metastatic disease is also possible. No second lesion is identified." - will need to inform neurosurgery when patient arrives to [facility] - brain MRI ordered; due to CKD3b, likely need to wait 24 hrs after MRI to then obtain CT C/A/P  - s/p decadron 10 mg x 1 in ER; continue on decadron 6 mg daily IV or as per neurosurgery - continue neurochecks every 4 hours  - prognosis seems guarded at best; would consult palliative care on arrival to [facility] as well to help facilitate discussions given complex social limitations; closest decision maker per group home is a friend, [name] (phone number in [place], she did not answer when called while patient in ER) - currently full code  Chronic kidney disease, stage 3b ([place]) - last labs from 2020 but renal function then appeared to be consistent with a baseline creatinine of 1.6 - 1.7 - currently 1.52 and likely baseline; baseline GFR appears to be ~33 - will need to split up imaging studies and watch renal fxn inbetween contrast doses - start on NS; watch Na as currently 144 on admission - BMP this evening and in am  Hypertension - Use labetalol or hydralazine PRN for now  Normocytic anemia - Previous baseline also appears to be around 11 g/dL -  Currently 12.7 g/dL on admission - Follow-up CBC in a.m., especially after fluids overnight  GAD (generalized anxiety disorder) - hold meds - follow up med rec  Schizoaffective disorder ([place]) - Hold home meds for now - Follow-up med rec     Code Status:   Full  DVT Prophylaxis: SCD     Anticipated disposition is to: pending clinical course  History: Past Medical History:  Diagnosis Date   Anemia, unspecified    Chronic kidney disease, stage 3 (moderate)     Generalized anxiety disorder    Hyperosmolality and hypernatremia    Hypertension    Muscle weakness (generalized)    Schizoaffective disorder ([place])     Past Surgical History:  Procedure Laterality Date   NO PAST SURGERIES       reports that she has never smoked. She has never used smokeless tobacco. She reports that she does not drink alcohol and does not use drugs.  No Known Allergies  Family History  Problem Relation Age of Onset   Heart disease Mother    Diabetic kidney disease Mother    Heart attack Mother    Heart Problems Father    Home Medications: Prior to Admission medications   Medication Sig Start Date End Date Taking? Authorizing Provider  acetaminophen (TYLENOL) 650 MG CR tablet Take 650 mg by mouth daily.   Yes [provider]  aspirin EC 81 MG tablet Take 81 mg by mouth daily. Swallow whole.   Yes [provider]  atorvastatin (LIPITOR) 20 MG tablet Take 20 mg by mouth daily.   Yes [provider]  benztropine (COGENTIN) 0.5 MG tablet Take 1 tablet (0.5 mg total) by mouth 2 (two) times daily. 09/28/13  Yes Buriev, Arie Sabina, MD  buPROPion (WELLBUTRIN XL) 150 MG 24 hr tablet Take 150 mg by mouth 2 (two) times daily.   Yes [provider]  clonazePAM (KLONOPIN) 0.5 MG tablet Take 1 tablet (0.5 mg total) by mouth 2 (two) times daily. 06/13/18  Yes Thurnell Lose, MD  OLANZapine (ZYPREXA) 20 MG tablet Take 20 mg by mouth at bedtime.   Yes [provider]  paliperidone (INVEGA SUSTENNA) 234 MG/1.5ML SUSY injection Inject 234 mg into the muscle every 30 (thirty) days.   Yes [provider]  polyethylene glycol (MIRALAX) packet Take 17 g by mouth daily. Patient taking differently: Take 17 g by mouth 2 (two) times daily. 06/13/18  Yes Thurnell Lose, MD  propranolol (INDERAL) 10 MG tablet Take 10 mg by mouth 2 (two) times daily.   Yes [provider]  Vitamin D, Ergocalciferol, (DRISDOL) 1.25 MG (50000  UNIT) CAPS capsule Take 50,000 Units by mouth every 7 (seven) days. Wednesday   Yes [provider]  risperiDONE (RISPERDAL) 3 MG tablet Take 1 tablet (3 mg total) by mouth 2 (two) times daily. Patient not taking: No sig reported 12/04/17   Aline August, MD    Review of Systems:  Review of systems not obtained due to patient factors.  Physical Exam: Vitals:   01/31/21 1615 01/31/21 1630 01/31/21 1645 01/31/21 1703  BP: (!) 126/106 (!) 149/105  125/90  Pulse: 83 82 84 80  Resp: '16 16 19 17  '$ Temp:      TempSrc:      SpO2: 100% 98% 100% 100%   General appearance:  awake and alert;oriented to name; no distress Head: Normocephalic, without obvious abnormality, atraumatic Eyes:  EOMI; PERRL; ~58m Lungs: clear to auscultation bilaterally Heart: regular  rate and rhythm and S1, S2 normal Abdomen:  obese, soft, NT, ND, BS present Extremities:  no edema Skin:  warm, dry, intact Neurologic: follows commands; moves all 4 extremities; strength seems equal in all 4; she does endorse the RUE and RLE to feel "different" and "Not as strong" compared to left upper and lower extremities   Labs on Admission:  I have personally reviewed following labs and imaging studies Results for orders placed or performed during the hospital encounter of 01/31/21 (from the past 24 hour(s))  Resp Panel by RT-PCR (Flu A&B, Covid) Nasopharyngeal Swab     Status: None   Collection Time: 01/31/21  4:22 PM   Specimen: Nasopharyngeal Swab; Nasopharyngeal(NP) swabs in vial transport medium  Result Value Ref Range   SARS Coronavirus 2 by RT PCR NEGATIVE NEGATIVE   Influenza A by PCR NEGATIVE NEGATIVE   Influenza B by PCR NEGATIVE NEGATIVE  CBC     Status: None   Collection Time: 01/31/21  4:42 PM  Result Value Ref Range   WBC 9.8 4.0 - 10.5 K/uL   RBC 4.68 3.87 - 5.11 MIL/uL   Hemoglobin 12.7 12.0 - 15.0 g/dL   HCT 39.6 36.0 - 46.0 %   MCV 84.6 80.0 - 100.0 fL   MCH 27.1 26.0 - 34.0 pg   MCHC 32.1 30.0  - 36.0 g/dL   RDW 13.4 11.5 - 15.5 %   Platelets 236 150 - 400 K/uL   nRBC 0.0 0.0 - 0.2 %  Comprehensive metabolic panel     Status: Abnormal   Collection Time: 01/31/21  4:42 PM  Result Value Ref Range   Sodium 144 135 - 145 mmol/L   Potassium 4.1 3.5 - 5.1 mmol/L   Chloride 107 98 - 111 mmol/L   CO2 27 22 - 32 mmol/L   Glucose, Bld 86 70 - 99 mg/dL   BUN 17 6 - 20 mg/dL   Creatinine, Ser 1.52 (H) 0.44 - 1.00 mg/dL   Calcium 10.8 (H) 8.9 - 10.3 mg/dL   Total Protein 8.3 (H) 6.5 - 8.1 g/dL   Albumin 4.7 3.5 - 5.0 g/dL   AST 15 15 - 41 U/L   ALT 14 0 - 44 U/L   Alkaline Phosphatase 120 38 - 126 U/L   Total Bilirubin 0.6 0.3 - 1.2 mg/dL   GFR, Estimated 39 (L) >60 mL/min   Anion gap 10 5 - 15  Protime-INR     Status: None   Collection Time: 01/31/21  4:42 PM  Result Value Ref Range   Prothrombin Time 12.5 11.4 - 15.2 seconds   INR 0.9 0.8 - 1.2     Radiological Exams on Admission: CT HEAD WO CONTRAST  Result Date: 01/31/2021 CLINICAL DATA:  Headache when lying down.  Right hemiparesis. EXAM: CT HEAD WITHOUT CONTRAST TECHNIQUE: Contiguous axial images were obtained from the base of the skull through the vertex without intravenous contrast. COMPARISON:  09/13/2019 FINDINGS: Brain: No abnormality seen affecting the brainstem or cerebellum. Right cerebral hemisphere is normal. Within the left parietal lobe, there is a cystic mass lesion measuring at least 4 cm in diameter. There is considerable regional vasogenic edema. Hemorrhage associated with the inferior margin the lesion shows an intraparenchymal hematoma measuring 2.5 x 1.5 cm in diameter. There is mass-effect upon the atrium and occipital horn of the left lateral ventricle. No hydrocephalus. No extra-axial fluid collection. Vascular: There is atherosclerotic calcification of the major vessels at the base of the brain. Skull:  Normal Sinuses/Orbits: Clear/normal Other: None IMPRESSION: 4 cm cystic necrotic mass in the left parietal  lobe with regional vasogenic edema and hemorrhage along the inferior margin of the mass, with the hematoma measuring 2.5 x 1.5 cm in size. 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 Metastatic disease is also possible. No second lesion is identified. Call report in progress. Electronically Signed   By: Nelson Chimes M.D.   On: 01/31/2021 14:04   DG Chest Port 1 View  Result Date: 01/31/2021 CLINICAL DATA:  Weakness EXAM: PORTABLE CHEST 1 VIEW COMPARISON:  June 08, 2018 FINDINGS: The heart size and mediastinal contours are within normal limits. Both lungs are clear. The visualized skeletal structures are unremarkable. IMPRESSION: No active disease. Electronically Signed   By: Dahlia Bailiff MD   On: 01/31/2021 16:33   DG HIP UNILAT WITH PELVIS 2-3 VIEWS LEFT  Result Date: 01/31/2021 CLINICAL DATA:  left hip pain and thigh pain; right hip pain and thigh pain EXAM: DG HIP (WITH OR WITHOUT PELVIS) 2-3V LEFT; DG HIP (WITH OR WITHOUT PELVIS) 2-3V RIGHT COMPARISON:  None FINDINGS: There is no acute fracture or dislocation. There is mild bilateral hip degenerative change. Mild findings of osteitis pubis. 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 Electronically Signed   By: Maurine Simmering   On: 01/31/2021 14:02   DG HIP UNILAT WITH PELVIS 2-3 VIEWS RIGHT  Result Date: 01/31/2021 CLINICAL DATA:  left hip pain and thigh pain; right hip pain and thigh pain EXAM: DG HIP (WITH OR WITHOUT PELVIS) 2-3V LEFT; DG HIP (WITH OR WITHOUT PELVIS) 2-3V RIGHT COMPARISON:  None FINDINGS: There is no acute fracture or dislocation. There is mild bilateral hip degenerative change. Mild findings of osteitis pubis. 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 Electronically Signed   By: Maurine Simmering   On: 01/31/2021 14:02   DG Chest Port 1 View  Final Result    MR BRAIN W WO CONTRAST    (Results Pending)    Consults called:  Neurosurgery to be paged upon arrival to [facility]   EKG:  Independently reviewed. NSR, Qtc 448   Dwyane Dee, MD [facility] 01/31/2021, 6:08 PM

## 2021-01-31 NOTE — Assessment & Plan Note (Signed)
-   Use labetalol or hydralazine PRN for now

## 2021-01-31 NOTE — Assessment & Plan Note (Signed)
-   hold meds - follow up med rec

## 2021-01-31 NOTE — Assessment & Plan Note (Signed)
-   Hold home meds for now - Follow-up med rec

## 2021-01-31 NOTE — Assessment & Plan Note (Signed)
-   per [facility] in ER on 01/31/21: "4 cm cystic necrotic mass in the left parietal lobe with regional vasogenic edema and hemorrhage along the inferior margin of the mass, with the hematoma measuring 2.5 x 1.5 cm in size. 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 Metastatic disease is also possible. No second lesion is identified." - will need to inform neurosurgery when patient arrives to [facility] - brain MRI ordered; due to CKD3b, likely need to wait 24 hrs after MRI to then obtain CT C/A/P  - s/p decadron 10 mg x 1 in ER; continue on decadron 6 mg daily IV or as per neurosurgery - continue neurochecks every 4 hours  - prognosis seems guarded at best; would consult palliative care on arrival to [facility] as well to help facilitate discussions given complex social limitations; closest decision maker per group home is a friend, [name] (phone number in [place], she did not answer when called while patient in ER) - currently full code

## 2021-02-01 DIAGNOSIS — F250 Schizoaffective disorder, bipolar type: Secondary | ICD-10-CM

## 2021-02-01 DIAGNOSIS — E663 Overweight: Secondary | ICD-10-CM | POA: Diagnosis present

## 2021-02-01 LAB — BASIC METABOLIC PANEL
Anion gap: 12 (ref 5–15)
BUN: 20 mg/dL (ref 6–20)
CO2: 26 mmol/L (ref 22–32)
Calcium: 10.9 mg/dL — ABNORMAL HIGH (ref 8.9–10.3)
Chloride: 108 mmol/L (ref 98–111)
Creatinine, Ser: 1.39 mg/dL — ABNORMAL HIGH (ref 0.44–1.00)
GFR, Estimated: 44 mL/min — ABNORMAL LOW (ref >60)
Glucose, Bld: 106 mg/dL — ABNORMAL HIGH (ref 70–99)
Potassium: 4.3 mmol/L (ref 3.5–5.1)
Sodium: 146 mmol/L — ABNORMAL HIGH (ref 135–145)

## 2021-02-01 LAB — CBC WITH DIFFERENTIAL/PLATELET
Abs Immature Granulocytes: 0.05 10*3/uL (ref 0.00–0.07)
Basophils Absolute: 0 10*3/uL (ref 0.0–0.1)
Basophils Relative: 0 %
Eosinophils Absolute: 0 10*3/uL (ref 0.0–0.5)
Eosinophils Relative: 0 %
HCT: 38.6 % (ref 36.0–46.0)
Hemoglobin: 12.3 g/dL (ref 12.0–15.0)
Immature Granulocytes: 1 %
Lymphocytes Relative: 13 %
Lymphs Abs: 1.4 10*3/uL (ref 0.7–4.0)
MCH: 27 pg (ref 26.0–34.0)
MCHC: 31.9 g/dL (ref 30.0–36.0)
MCV: 84.6 fL (ref 80.0–100.0)
Monocytes Absolute: 0.4 10*3/uL (ref 0.1–1.0)
Monocytes Relative: 4 %
Neutro Abs: 8.4 10*3/uL — ABNORMAL HIGH (ref 1.7–7.7)
Neutrophils Relative %: 82 %
Platelets: 271 10*3/uL (ref 150–400)
RBC: 4.56 MIL/uL (ref 3.87–5.11)
RDW: 13.5 % (ref 11.5–15.5)
WBC: 10.3 10*3/uL (ref 4.0–10.5)
nRBC: 0 % (ref 0.0–0.2)

## 2021-02-01 LAB — HIV ANTIBODY (ROUTINE TESTING W REFLEX): HIV Screen 4th Generation wRfx: NONREACTIVE

## 2021-02-01 LAB — MAGNESIUM: Magnesium: 2.1 mg/dL (ref 1.7–2.4)

## 2021-02-01 NOTE — Progress Notes (Signed)
Neurosurgery  60 yo F with schizoaffective disorder living in a group home developed progressive weakness and speech change.  She was found to have a 4 cm cystic/necrotic L parietal mass.  Transfer to [facility] recommended.  She will need metastatic workup with CT Chest/Abd/Pelvis, and MRI brain with/without contrast including BrainLab protocol.  Dexamethasone 4 mg IV q6h was recommended as well.

## 2021-02-01 NOTE — ED Notes (Signed)
Dr Ricki Rodriguez was sent a secure message in regards to possible change of diet.

## 2021-02-01 NOTE — ED Notes (Signed)
Patient ate a sandwich, apple sauce and soda.  Tolerated well.  Patient fed herself but has shakiness which she reports is normal for her

## 2021-02-01 NOTE — ED Notes (Signed)
Drinking sips of ginger ale with assist

## 2021-02-01 NOTE — ED Notes (Signed)
Patient appears to be sleeping when checked frequently

## 2021-02-01 NOTE — ED Notes (Signed)
Palliative Care - message left on voice mail regarding this patient having a palliative order and needing consult.

## 2021-02-01 NOTE — ED Notes (Signed)
Patient request something to drink- ginger ale provided.  Tolerates well.  Patient alert but appears confused and unable to pick and choose wording at times.

## 2021-02-01 NOTE — ED Notes (Signed)
Patient was medicated for consistent high blood pressure readings- labetalol '10mg'$  given IV as ordered.  Patient is talkative but pleasantly confused at times.  Patient has been napping most of the day

## 2021-02-01 NOTE — ED Notes (Signed)
Warm blanket for comfort when checked.  Patient arouses easily and is talkative.  Alert but pleasantly confused at times

## 2021-02-01 NOTE — Progress Notes (Signed)
PROGRESS NOTE  Tara Hurst [id number] DOB: 1960-09-12 DOA: 01/31/2021 PCP: [name], MD  HPI/Recap of past 24 hours: Patient is a 60 year old female with past medical history of hypertension and schizoaffective disorder with stage IIIb chronic kidney disease who resides in a group home and was sent over for several weeks of trouble walking and also noted to have word finding issues.  In the emergency room, CT confirmed large 4 cm lesion in the posterior left parietal lobe with vasogenic edema and some associated hemorrhage causing mass-effect of the posterior horn and atrium of the left lateral ventricle with midline shift at 3.5 mm.  Patient started on IV steroids and admitted to the hospitalist service.  Plans to transfer to [facility] for neurosurgical evaluation.  Patient had a good night.  She continues to have some word finding difficulty but has no complaints.  Denies any headache.  Assessment/Plan: Principal Problem:   Brain mass causing issues with walking, word finding difficulty: Highly suspicious for glioblastoma.  Terminal diagnosis.  Patient does not have any family and resides in a group home.  She has a close friend was unable to be contacted at time of admission.  We will reattempt to do so.  Have consulted palliative care.  Continue IV steroids. Active Problems:   Schizoaffective disorder [facility]): Continue home medications.  Hypertension: Blood pressure is elevated here.  Patient is n.p.o. and have started as needed IV antihypertensive medication    GAD (generalized anxiety disorder): Continue home medications.    Chronic kidney disease, stage 3b ([place]): At baseline.    Normocytic anemia: Stable.    Overweight (BMI 25.0-29.9): Meets criteria BMI greater than 25   Code Status: Full code  Family Communication: Attempting to call friend, patient has no family  Disposition Plan: To be determined   Consultants: Neurosurgery Palliative  care  Procedures: None  Antimicrobials: None  DVT prophylaxis: SCDs  Level of care: Progressive   Objective: Vitals:   02/01/21 0830 02/01/21 0900  BP: (!) 156/100 (!) 164/103  Pulse: 87 88  Resp: 16 16  Temp:    SpO2: 99% 100%   No intake or output data in the 24 hours ending 02/01/21 [place] Weights   01/31/21 1950  Weight: 76 kg   Body mass index is 27.88 kg/m.  Exam:  General: Alert and oriented x2, no acute distress HEENT: Normocephalic, atraumatic, mucous membranes are slightly dry.  Poor dentition Cardiovascular: Regular rate and rhythm, S1-S2 Respiratory: Clear to auscultation bilaterally Abdomen: Soft, nontender, nondistended with positive bowel sounds Musculoskeletal: No clubbing or cyanosis or edema Skin: No skin breaks, tears or lesions Psychiatry: Patient has underlying schizoaffective disorder, but currently is appropriate.  No evidence of acute psychosis   Data Reviewed: CBC: Recent Labs  Lab 01/31/21 1642 02/01/21 0508  WBC 9.8 10.3  NEUTROABS  --  8.4*  HGB 12.7 12.3  HCT 39.6 38.6  MCV 84.6 84.6  PLT 236 [id number]   Basic Metabolic Panel: Recent Labs  Lab 01/31/21 1642 02/01/21 0508  NA 144 146*  K 4.1 4.3  CL 107 108  CO2 27 26  GLUCOSE 86 106*  BUN 17 20  CREATININE 1.52* 1.39*  CALCIUM 10.8* 10.9*  MG  --  2.1   GFR: Estimated Creatinine Clearance: 44.4 mL/min (A) (by C-G formula based on SCr of 1.39 mg/dL (H)). Liver Function Tests: Recent Labs  Lab 01/31/21 1642  AST 15  ALT 14  ALKPHOS 120  BILITOT 0.6  PROT  8.3*  ALBUMIN 4.7   No results for input(s): LIPASE, AMYLASE in the last 168 hours. No results for input(s): AMMONIA in the last 168 hours. Coagulation Profile: Recent Labs  Lab 01/31/21 1642  INR 0.9   Cardiac Enzymes: No results for input(s): CKTOTAL, CKMB, CKMBINDEX, TROPONINI in the last 168 hours. BNP (last 3 results) No results for input(s): PROBNP in the last 8760 hours. HbA1C: No results  for input(s): HGBA1C in the last 72 hours. CBG: No results for input(s): GLUCAP in the last 168 hours. Lipid Profile: No results for input(s): CHOL, HDL, LDLCALC, TRIG, CHOLHDL, LDLDIRECT in the last 72 hours. Thyroid Function Tests: No results for input(s): TSH, T4TOTAL, FREET4, T3FREE, THYROIDAB in the last 72 hours. Anemia Panel: No results for input(s): VITAMINB12, FOLATE, FERRITIN, TIBC, IRON, RETICCTPCT in the last 72 hours. Urine analysis:    Component Value Date/Time   COLORURINE STRAW (A) 02/18/2019 1334   APPEARANCEUR CLEAR 02/18/2019 1334   LABSPEC 1.006 02/18/2019 1334   PHURINE 7.0 02/18/2019 1334   GLUCOSEU NEGATIVE 02/18/2019 1334   HGBUR NEGATIVE 02/18/2019 1334   BILIRUBINUR NEGATIVE 02/18/2019 1334   KETONESUR NEGATIVE 02/18/2019 1334   PROTEINUR NEGATIVE 02/18/2019 1334   UROBILINOGEN 0.2 09/25/2013 1322   NITRITE NEGATIVE 02/18/2019 1334   LEUKOCYTESUR NEGATIVE 02/18/2019 1334   Sepsis Labs: '@LABRCNTIP'$ (procalcitonin:4,lacticidven:4)  ) Recent Results (from the past 240 hour(s))  Resp Panel by RT-PCR (Flu A&B, Covid) Nasopharyngeal Swab     Status: None   Collection Time: 01/31/21  4:22 PM   Specimen: Nasopharyngeal Swab; Nasopharyngeal(NP) swabs in vial transport medium  Result Value Ref Range Status   SARS Coronavirus 2 by RT PCR NEGATIVE NEGATIVE Final    Comment: (NOTE) SARS-CoV-2 target nucleic acids are NOT DETECTED.  The SARS-CoV-2 RNA is generally detectable in upper respiratory specimens during the acute phase of infection. The lowest concentration of SARS-CoV-2 viral copies this assay can detect is 138 copies/mL. A negative result does not preclude SARS-Cov-2 infection and should not be used as the sole basis for treatment or other patient management decisions. A negative result may occur with  improper specimen collection/handling, submission of specimen other than nasopharyngeal swab, presence of viral mutation(s) within the areas targeted  by this assay, and inadequate number of viral copies(<138 copies/mL). A negative result must be combined with clinical observations, patient history, and epidemiological information. The expected result is Negative.  Fact Sheet for Patients:  EntrepreneurPulse.com.au  Fact Sheet for Healthcare Providers:  IncredibleEmployment.be  This test is no t yet approved or cleared by the [place] FDA and  has been authorized for detection and/or diagnosis of SARS-CoV-2 by FDA under an Emergency Use Authorization (EUA). This EUA will remain  in effect (meaning this test can be used) for the duration of the COVID-19 declaration under Section 564(b)(1) of the Act, 21 U.S.C.section 360bbb-3(b)(1), unless the authorization is terminated  or revoked sooner.       Influenza A by PCR NEGATIVE NEGATIVE Final   Influenza B by PCR NEGATIVE NEGATIVE Final    Comment: (NOTE) The Xpert Xpress SARS-CoV-2/FLU/RSV plus assay is intended as an aid in the diagnosis of influenza from Nasopharyngeal swab specimens and should not be used as a sole basis for treatment. Nasal washings and aspirates are unacceptable for Xpert Xpress SARS-CoV-2/FLU/RSV testing.  Fact Sheet for Patients: EntrepreneurPulse.com.au  Fact Sheet for Healthcare Providers: IncredibleEmployment.be  This test is not yet approved or cleared by the [place] and has been authorized for  detection and/or diagnosis of SARS-CoV-2 by FDA under an Emergency Use Authorization (EUA). This EUA will remain in effect (meaning this test can be used) for the duration of the COVID-19 declaration under Section 564(b)(1) of the Act, 21 U.S.C. section 360bbb-3(b)(1), unless the authorization is terminated or revoked.  Performed at [facility], [place] [address] [place], [place] [address]       Studies: CT HEAD WO CONTRAST  Result Date:  01/31/2021 CLINICAL DATA:  Headache when lying down.  Right hemiparesis. EXAM: CT HEAD WITHOUT CONTRAST TECHNIQUE: Contiguous axial images were obtained from the base of the skull through the vertex without intravenous contrast. COMPARISON:  09/13/2019 FINDINGS: Brain: No abnormality seen affecting the brainstem or cerebellum. Right cerebral hemisphere is normal. Within the left parietal lobe, there is a cystic mass lesion measuring at least 4 cm in diameter. There is considerable regional vasogenic edema. Hemorrhage associated with the inferior margin the lesion shows an intraparenchymal hematoma measuring 2.5 x 1.5 cm in diameter. There is mass-effect upon the atrium and occipital horn of the left lateral ventricle. No hydrocephalus. No extra-axial fluid collection. Vascular: There is atherosclerotic calcification of the major vessels at the base of the brain. Skull: Normal Sinuses/Orbits: Clear/normal Other: None IMPRESSION: 4 cm cystic necrotic mass in the left parietal lobe with regional vasogenic edema and hemorrhage along the inferior margin of the mass, with the hematoma measuring 2.5 x 1.5 cm in size. 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 Metastatic disease is also possible. No second lesion is identified. Call report in progress. Electronically Signed   By: Nelson Chimes M.D.   On: 01/31/2021 14:04   MR BRAIN W WO CONTRAST  Result Date: 01/31/2021 CLINICAL DATA:  Brain mass. EXAM: MRI HEAD WITHOUT AND WITH CONTRAST TECHNIQUE: Multiplanar, multiecho pulse sequences of the brain and surrounding structures were obtained without and with intravenous contrast. CONTRAST:  6.26m GADAVIST GADOBUTROL 1 MMOL/ML IV SOLN COMPARISON:  CT head without contrast 01/31/2021 and 09/13/2019 FINDINGS: Brain: A heterogeneous solid and cystic multiloculated mass lesion is present in the posterior left parietal lobe. Acute blood products are noted anteriorly within the lesion. Peripheral enhancement is  present. The lesion measures 5.0 x 4.9 x 3.4 cm. Additional separate punctate focus of enhancement is noted along the medial margin of the lesion. Extensive T2 signal changes extend anteriorly in the left parietal lobe and inferiorly into the occipital and posterior temporal lobe. Mass effect is present with partial effacement of the posterior horn and atrium of the left lateral ventricle. Midline shift measures 3.5 mm. No distal scratched at no additional more distal lesions are present. Mild sulcal effacement is noted throughout the left hemisphere. No acute infarct or hemorrhage is present. Restricted diffusion is noted at the margin of the lesion. Vascular: Flow is present in the major intracranial arteries. Skull and upper cervical spine: Degenerative changes noted in the upper cervical spine. Slight anterolisthesis is present at C3-4. Fusion is present across the disc space at C3-4. Marrow signal is normal. Midline structures are otherwise within normal limits. Sinuses/Orbits: The paranasal sinuses and mastoid air cells are clear. Globes are deviated to the right. Globes and orbits are otherwise unremarkable. IMPRESSION: 1. 5.0 x 4.9 x 3.4 cm heterogeneous solid and cystic mass lesion in the posterior left parietal lobe with extensive T2 signal changes extending anteriorly into the left parietal lobe and inferiorly into the occipital and posterior temporal lobe. Acute blood products are present in the anterior aspect of  the lesion. Findings are most concerning for a high-grade primary CNS neoplasm such as a GBM. Metastatic disease is considered less likely. 2. Mass effect with partial effacement of the posterior horn and atrium of the left lateral ventricle. Midline shift measures 3.5 mm. Electronically Signed   By: San Morelle M.D.   On: 01/31/2021 19:33   DG Chest Port 1 View  Result Date: 01/31/2021 CLINICAL DATA:  Weakness EXAM: PORTABLE CHEST 1 VIEW COMPARISON:  June 08, 2018 FINDINGS: The  heart size and mediastinal contours are within normal limits. Both lungs are clear. The visualized skeletal structures are unremarkable. IMPRESSION: No active disease. Electronically Signed   By: Dahlia Bailiff MD   On: 01/31/2021 16:33   DG HIP UNILAT WITH PELVIS 2-3 VIEWS LEFT  Result Date: 01/31/2021 CLINICAL DATA:  left hip pain and thigh pain; right hip pain and thigh pain EXAM: DG HIP (WITH OR WITHOUT PELVIS) 2-3V LEFT; DG HIP (WITH OR WITHOUT PELVIS) 2-3V RIGHT COMPARISON:  None FINDINGS: There is no acute fracture or dislocation. There is mild bilateral hip degenerative change. Mild findings of osteitis pubis. 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 Electronically Signed   By: Maurine Simmering   On: 01/31/2021 14:02   DG HIP UNILAT WITH PELVIS 2-3 VIEWS RIGHT  Result Date: 01/31/2021 CLINICAL DATA:  left hip pain and thigh pain; right hip pain and thigh pain EXAM: DG HIP (WITH OR WITHOUT PELVIS) 2-3V LEFT; DG HIP (WITH OR WITHOUT PELVIS) 2-3V RIGHT COMPARISON:  None FINDINGS: There is no acute fracture or dislocation. There is mild bilateral hip degenerative change. Mild findings of osteitis pubis. 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 Electronically Signed   By: Maurine Simmering   On: 01/31/2021 14:02    Scheduled Meds:  dexamethasone (DECADRON) injection  6 mg Intravenous Daily   sodium chloride flush  3 mL Intravenous Q12H    Continuous Infusions:  sodium chloride       LOS: 1 day     Annita Brod, MD [facility]   02/01/2021, 9:31 AM

## 2021-02-01 NOTE — ED Notes (Signed)
Lab draw unsuccessful 

## 2021-02-02 DIAGNOSIS — Z515 Encounter for palliative care: Secondary | ICD-10-CM

## 2021-02-02 DIAGNOSIS — G9340 Encephalopathy, unspecified: Secondary | ICD-10-CM

## 2021-02-02 DIAGNOSIS — N189 Chronic kidney disease, unspecified: Secondary | ICD-10-CM

## 2021-02-02 DIAGNOSIS — Z7189 Other specified counseling: Secondary | ICD-10-CM

## 2021-02-02 LAB — MAGNESIUM: Magnesium: 2.3 mg/dL (ref 1.7–2.4)

## 2021-02-02 LAB — CBC WITH DIFFERENTIAL/PLATELET
Abs Immature Granulocytes: 0.05 10*3/uL (ref 0.00–0.07)
Basophils Absolute: 0 10*3/uL (ref 0.0–0.1)
Basophils Relative: 0 %
Eosinophils Absolute: 0 10*3/uL (ref 0.0–0.5)
Eosinophils Relative: 0 %
HCT: 37.3 % (ref 36.0–46.0)
Hemoglobin: 12.2 g/dL (ref 12.0–15.0)
Immature Granulocytes: 1 %
Lymphocytes Relative: 17 %
Lymphs Abs: 1.9 10*3/uL (ref 0.7–4.0)
MCH: 26.8 pg (ref 26.0–34.0)
MCHC: 32.7 g/dL (ref 30.0–36.0)
MCV: 82 fL (ref 80.0–100.0)
Monocytes Absolute: 1.4 10*3/uL — ABNORMAL HIGH (ref 0.1–1.0)
Monocytes Relative: 13 %
Neutro Abs: 7.8 10*3/uL — ABNORMAL HIGH (ref 1.7–7.7)
Neutrophils Relative %: 69 %
Platelets: 287 10*3/uL (ref 150–400)
RBC: 4.55 MIL/uL (ref 3.87–5.11)
RDW: 13.4 % (ref 11.5–15.5)
WBC: 11.1 10*3/uL — ABNORMAL HIGH (ref 4.0–10.5)
nRBC: 0 % (ref 0.0–0.2)

## 2021-02-02 LAB — BASIC METABOLIC PANEL
Anion gap: 11 (ref 5–15)
BUN: 30 mg/dL — ABNORMAL HIGH (ref 6–20)
CO2: 25 mmol/L (ref 22–32)
Calcium: 10.9 mg/dL — ABNORMAL HIGH (ref 8.9–10.3)
Chloride: 109 mmol/L (ref 98–111)
Creatinine, Ser: 1.65 mg/dL — ABNORMAL HIGH (ref 0.44–1.00)
GFR, Estimated: 36 mL/min — ABNORMAL LOW (ref >60)
Glucose, Bld: 104 mg/dL — ABNORMAL HIGH (ref 70–99)
Potassium: 4.1 mmol/L (ref 3.5–5.1)
Sodium: 145 mmol/L (ref 135–145)

## 2021-02-02 MED ORDER — METOPROLOL TARTRATE 12.5 MG HALF TABLET
12.5000 mg | ORAL_TABLET | Freq: Two times a day (BID) | ORAL | Status: DC
  Administered 2021-02-02 – 2021-02-04 (×5): 12.5 mg via ORAL
  Filled 2021-02-02 (×6): qty 1

## 2021-02-02 MED ORDER — CEFAZOLIN SODIUM-DEXTROSE 2-4 GM/100ML-% IV SOLN
2.0000 g | INTRAVENOUS | Status: DC
  Filled 2021-02-02: qty 100

## 2021-02-02 MED ORDER — LACTATED RINGERS IV SOLN: INTRAVENOUS | Status: DC

## 2021-02-02 MED ORDER — DEXAMETHASONE SODIUM PHOSPHATE 4 MG/ML IJ SOLN
4.0000 mg | Freq: Four times a day (QID) | INTRAMUSCULAR | Status: DC
  Administered 2021-02-02 (×2): 4 mg via INTRAVENOUS
  Filled 2021-02-02 (×3): qty 1

## 2021-02-02 NOTE — Plan of Care (Signed)

## 2021-02-02 NOTE — Consult Note (Signed)
CC: Aphasia, confusion  HPI:     Patient is a 60 y.o. female with schizoaffective disorder living in a group home, CKD who presented to [facility] for difficulty ambulating over the last 2 weeks and poor speech.  She underwent imaging which showed a 4 cm L parietal lobe mass with hemorrhage and surrounding edema.  Her speech improved somewhat with dexamethasone.  She was transferred to [facility] for further care.  On questioning, the patient denies seizures or headaches.    Patient Active Problem List   Diagnosis Date Noted   Overweight (BMI 25.0-29.9) 02/01/2021   Brain mass 01/31/2021   Chronic kidney disease, stage 3b ([place]) 01/31/2021   Normocytic anemia 01/31/2021   Radial nerve palsy, 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   Malnutrition of moderate degree 06/14/2018   AKI (acute kidney injury) ([place]) 06/06/2018   Sepsis due to undetermined organism ([place]) 06/06/2018   Tachycardia 12/03/2017   Acute lower UTI 12/03/2017   GAD (generalized anxiety disorder) 10/19/2017   Anxiety    UTI due to Klebsiella species    Hypernatremia    Acute encephalopathy 05/03/2017   ARF (acute renal failure) ([place]) 05/03/2017   HCAP (healthcare-associated pneumonia) 09/25/2013   Schizoaffective disorder ([place]) 09/25/2013   Hypertension 09/25/2013   Abnormal lithium level in blood 09/25/2013   Past Medical History:  Diagnosis Date   Anemia, unspecified    Chronic kidney disease, stage 3 (moderate)    Generalized anxiety disorder    Hyperosmolality and hypernatremia    Hypertension    Muscle weakness (generalized)    Schizoaffective disorder (HCC)     Past Surgical History:  Procedure Laterality Date   NO PAST SURGERIES      Medications Prior to Admission  Medication Sig Dispense Refill Last Dose   acetaminophen (TYLENOL) 650 MG CR tablet Take 650 mg by mouth daily.   01/31/2021   aspirin EC 81 MG tablet Take 81 mg by mouth daily. Swallow whole.   01/31/2021   atorvastatin (LIPITOR) 20 MG tablet Take 20 mg by mouth daily.    01/30/2021   benztropine (COGENTIN) 0.5 MG tablet Take 1 tablet (0.5 mg total) by mouth 2 (two) times daily. 60 tablet 2 01/31/2021   buPROPion (WELLBUTRIN XL) 150 MG 24 hr tablet Take 150 mg by mouth 2 (two) times daily.   01/31/2021   clonazePAM (KLONOPIN) 0.5 MG tablet Take 1 tablet (0.5 mg total) by mouth 2 (two) times daily. 10 tablet 0 01/31/2021   OLANZapine (ZYPREXA) 20 MG tablet Take 20 mg by mouth at bedtime.   01/30/2021   paliperidone (INVEGA SUSTENNA) 234 MG/1.5ML SUSY injection Inject 234 mg into the muscle every 30 (thirty) days.   01/31/2021   polyethylene glycol (MIRALAX) packet Take 17 g by mouth daily. (Patient taking differently: Take 17 g by mouth 2 (two) times daily.) 28 each 0 01/31/2021   propranolol (INDERAL) 10 MG tablet Take 10 mg by mouth 2 (two) times daily.   01/31/2021 at 8 am   Vitamin D, Ergocalciferol, (DRISDOL) 1.25 MG (50000 UNIT) CAPS capsule Take 50,000 Units by mouth every 7 (seven) days. Wednesday   01/26/2021   risperiDONE (RISPERDAL) 3 MG tablet Take 1 tablet (3 mg total) by mouth 2 (two) times daily. (Patient not taking: No sig reported) 10 tablet 0 Not Taking   No Known Allergies  Social History   Tobacco Use   Smoking status: Never   Smokeless tobacco: Never  Substance Use Topics   Alcohol use: No    Family  History  Problem Relation Age of Onset   Heart disease Mother    Diabetic kidney disease Mother    Heart attack Mother    Heart Problems Father      Review of Systems Review of systems not obtained due to patient factors.  Objective:   Patient Vitals for the past 8 hrs:  BP Temp Temp src Pulse Resp SpO2  02/02/21 0722 (!) 147/92 98.2 F (36.8 C) Oral (!) 101 14 98 %  02/02/21 0352 (!) 106/94 98.2 F (36.8 C) -- 99 18 100 %   I/O last 3 completed shifts: In: -  Out: 875 [Urine:875] Total I/O In: 3 [I.V.:3] Out: -       General : Alert, pleasant, cooperative, no distress, appears stated age   Head:  Normocephalic/atraumatic    Eyes:  PERRL, conjunctiva/corneas clear, EOM's intact. Fundi could not be visualized Neck: Supple Chest:  Respirations unlabored Chest wall: no tenderness or deformity Heart: Regular rate and rhythm Abdomen: Soft, nontender and nondistended Extremities: warm and well-perfused Skin: normal turgor, color and texture Neurologic:  Alert, oriented to first name.  Follows simple commands but some difficulties with complex commands.  Speech scanning and slow.  Eyes open spontaneously. PERRL, EOMI, VFC, no facial droop. V1-3 intact.  No dysarthria, tongue protrusion symmetric.  CNII-XII intact. Normal strength, sensation and reflexes throughout.  Right sided pronator drift.       Data ReviewCBC:  Lab Results  Component Value Date   WBC 11.1 (H) 02/02/2021   RBC 4.55 02/02/2021   BMP:  Lab Results  Component Value Date   GLUCOSE 104 (H) 02/02/2021   CO2 25 02/02/2021   BUN 30 (H) 02/02/2021   CREATININE 1.65 (H) 02/02/2021   CALCIUM 10.9 (H) 02/02/2021   Radiology review:  CT head and MRI reviewed. Complex 5 cm multicystic left posterior inferior parietal lobule mass.  There is associated hemorrhage into a more anterior cyst.  There is areas of satellite enhancement.  Assessment:   Principal Problem:   Brain mass Active Problems:   Schizoaffective disorder (HCC)   Hypertension   Acute encephalopathy   GAD (generalized anxiety disorder)   Chronic kidney disease, stage 3b (HCC)   Normocytic anemia   Overweight (BMI 25.0-29.9)  This is a 60 yo F with a large malignant-appearing left parietal lobe mass.  Given the areas of satellite enhancement and lack of diffusion restriction, glioblastoma would be the primary consideration Plan:  - given the size and bulk of the mass and the associated hemorrhage, I recommend craniotomy for resection which would allow diagnosis as well as therapeutic benefit of cytoreduction and relief of mass effect.   - there is no established POA.  I attempted to  call her caretaker but was unable to leave a message.  Although my conversations with the patient were limited, she was able to demonstrate at least a modicum of understanding of my recommendations and the proposed surgery.  I discussed the general technique of surgery and risks, benefits, alternatives, and expected convalescence.  I discussed risks of bleeding, pain, infection, seizure, stroke, scar, neurologic deficit, coma, and death.   She stated she will think about whether she would like to proceed.  Given the size and aggressive-appearing nature of the tumor, if she wishes not to proceed with resection, it would be reasonable to expect a very limited life expectancy of 1 or 2 months, with progressive neurologic decline.  The prognosis with resection will depend very much on the  pathology, but surgery could provide palliative benefit as well as extend her life expectancy.  As such, I think we should proceed with surgery tomorrow unless we establish a strong preference from the patient to avoid any aggressive treatment, in which case she would be a good hospice candidate.

## 2021-02-02 NOTE — Consult Note (Signed)
Consultation Note Date: 02/02/2021   Patient Name: Tara Hurst  DOB: [birth year]  MRN: [id number]  Age / Sex: 60 y.o., female  PCP: [name], MD Referring Physician: Hosie Poisson, MD  Reason for Consultation: Establishing goals of care and Psychosocial/spiritual support  HPI/Patient Profile: 60 y.o. female   admitted on 01/31/2021 with   female with PMH CKD3b, GAD, anemia, HTN, schizoaffective disorder who resides at a group home ([facility]) and was sent to the ER due to difficulty ambulating over the past 2 weeks.  Reported patient leaning to the right and a fall, also speech has become more difficult to understand.  Tara Hurst lives in an assisted living,  Tara Hurst is the contact at the facility. Tara Hurst, [phone], is a Education officer, museum who worked with Onalee Hua in the past and has maintained a relationship with her over the last 5 years. .    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  Concern is for an underlying malignant brain tumor such as glioblastoma.  She was treated with Decadron 10 mg IV in the ER.  Case was discussed with neurosurgery and she was recommended for transfer to [facility] for further work-up and evaluation.   Currently patient is awake and alert however she is only oriented to person.  She has no insight into this hospitalization, associated diagnosis and decisions.  Treatment options, advanced directives and anticipatory care needs need to be addressed.     Clinical Assessment and Goals of Care:   This NP Wadie Lessen reviewed medical records, received report from team, assessed the patient and then meet at the patient's bedside  to discuss diagnosis, prognosis, GOC, EOL wishes disposition and  options.  He is a was alert and oriented and pleasant.  However she did not know that she was in the hospital, and is unaware of work-up findings and associated decisions  I was able to speak with Tara Hurst by phone.  Tara Hurst worked with the patient 5 years ago when she was a resident at [employer].  Tara Hurst is a Education officer, museum and currently works for [employer].  Tara Hurst has maintained a relationship with the patient and has been a strong advocate for her over the last 5 years. The patient has no living relatives.  There is no documented H POA or guardian.   I discussed with Tara Hurst the Concept of Palliative Care  as specialized medical care for people and their families living with serious illness.  If focuses on providing relief from the symptoms and stress of a serious illness.  The goal is to improve quality of life for both the patient and the family.  Created space and opportunity for Tara Hurst to explore thoughts and feelings regarding current medical situation.   Tara Hurst is willing to continue to act as Ms. Tardie advocate.  She verbalizes an understanding of the newly  diagnosed brain mass.     A  discussion was had today regarding advanced directives.  Concepts specific to code status, artifical feeding and hydration, continued IV antibiotics and rehospitalization was had.  The difference between a aggressive medical intervention path  and a palliative comfort care path for this patient at this time was had.  Values and goals of care important to patient and family were attempted to be elicited.   MOST form introduced.  Tara Hurst is familiar with this form    Education offered regarding patient's current medical situation and recommendation from neurosurgery for craniotomy/resection not only for diagnosis but relief of mass-effect.  Tara Hurst  questions the risks and benefits of of treatments.  Tara Hurst plans to speak with her colleagues and support people hoping to make the best decisions for Velma.  Plan is  to meet tomorrow at 32 for ongoing discussion.    Questions and concerns addressed.  Patient  encouraged to call with questions or concerns.     PMT will continue to support holistically.         There is no documented H POA.  Patient herself does not have capacity to make her own medical decisions at this time. Tara Hurst has been acting in patient's best interest over the last 5 years.       SUMMARY OF RECOMMENDATIONS    Code Status/Advance Care Planning: Full code   Palliative Prophylaxis:  Delirium Protocol and Frequent Pain Assessment  Additional Recommendations (Limitations, Scope, Preferences): Full Scope Treatment  Psycho-social/Spiritual:  Desire for further Chaplaincy support:yes Additional Recommendations: Education on Hospice  Prognosis:  Unable to determine  Discharge Planning: To Be Determined      Primary Diagnoses: Present on Admission:  GAD (generalized anxiety disorder)  Chronic kidney disease, stage 3b (HCC)  Normocytic anemia  Hypertension  Schizoaffective disorder ([place])  Overweight (BMI 25.0-29.9)  Acute encephalopathy   I have reviewed the medical record, interviewed the patient and family, and examined the patient. The following aspects are pertinent.  Past Medical History:  Diagnosis Date   Anemia, unspecified    Chronic kidney disease, stage 3 (moderate)    Generalized anxiety disorder    Hyperosmolality and hypernatremia    Hypertension    Muscle weakness (generalized)    Schizoaffective disorder (HCC)    Social History   Socioeconomic History   Marital status: Single    Spouse name: Not on file   Number of children: 0   Years of education: Not on file   Highest education level: Associate degree: occupational, Hotel manager, or vocational program  Occupational History   Occupation: Disabled  Tobacco Use   Smoking status: Never   Smokeless tobacco: Never  Vaping Use   Vaping Use: Never used  Substance and Sexual  Activity   Alcohol use: No   Drug use: No   Sexual activity: Never  Other Topics Concern   Not on file  Social History Narrative   Lives at home alone   Right handed   Followed by [employer]   Social Determinants of Health   Financial Resource Strain: Not on file  Food Insecurity: Not on file  Transportation Needs: Not on file  Physical Activity: Not on file  Stress: Not on file  Social Connections: Not on file   Family History  Problem Relation Age of Onset   Heart disease Mother    Diabetic kidney disease Mother    Heart attack Mother    Heart Problems Father    Scheduled  Meds:  dexamethasone (DECADRON) injection  4 mg Intravenous Q6H   sodium chloride flush  3 mL Intravenous Q12H   Continuous Infusions:   ceFAZolin (ANCEF) IV     PRN Meds:.hydrALAZINE, labetalol Medications Prior to Admission:  Prior to Admission medications   Medication Sig Start Date End Date Taking? Authorizing Provider  acetaminophen (TYLENOL) 650 MG CR tablet Take 650 mg by mouth daily.   Yes [provider]  aspirin EC 81 MG tablet Take 81 mg by mouth daily. Swallow whole.   Yes [provider]  atorvastatin (LIPITOR) 20 MG tablet Take 20 mg by mouth daily.   Yes [provider]  benztropine (COGENTIN) 0.5 MG tablet Take 1 tablet (0.5 mg total) by mouth 2 (two) times daily. 09/28/13  Yes Buriev, Arie Sabina, MD  buPROPion (WELLBUTRIN XL) 150 MG 24 hr tablet Take 150 mg by mouth 2 (two) times daily.   Yes [provider]  clonazePAM (KLONOPIN) 0.5 MG tablet Take 1 tablet (0.5 mg total) by mouth 2 (two) times daily. 06/13/18  Yes Thurnell Lose, MD  OLANZapine (ZYPREXA) 20 MG tablet Take 20 mg by mouth at bedtime.   Yes [provider]  paliperidone (INVEGA SUSTENNA) 234 MG/1.5ML SUSY injection Inject 234 mg into the muscle every 30 (thirty) days.   Yes [provider]  polyethylene glycol (MIRALAX) packet Take 17 g by mouth daily. Patient taking  differently: Take 17 g by mouth 2 (two) times daily. 06/13/18  Yes Thurnell Lose, MD  propranolol (INDERAL) 10 MG tablet Take 10 mg by mouth 2 (two) times daily.   Yes [provider]  Vitamin D, Ergocalciferol, (DRISDOL) 1.25 MG (50000 UNIT) CAPS capsule Take 50,000 Units by mouth every 7 (seven) days. Wednesday   Yes [provider]  risperiDONE (RISPERDAL) 3 MG tablet Take 1 tablet (3 mg total) by mouth 2 (two) times daily. Patient not taking: No sig reported 12/04/17   Aline August, MD   No Known Allergies Review of Systems  Physical Exam  Vital Signs: BP (!) 147/92 (BP Location: Left Arm)   Pulse (!) 101   Temp 98.2 F (36.8 C) (Oral)   Resp 14   Ht '5\' 5"'$  (1.651 m)   Wt 76 kg   SpO2 98%   BMI 27.88 kg/m  Pain Scale: 0-10   Pain Score: Asleep   SpO2: SpO2: 98 % O2 Device:SpO2: 98 % O2 Flow Rate: .   IO: Intake/output summary:  Intake/Output Summary (Last 24 hours) at 02/02/2021 1102 Last data filed at 02/02/2021 0920 Gross per 24 hour  Intake 243 ml  Output 875 ml  Net -632 ml    LBM: Last BM Date:  (pta) Baseline Weight: Weight: 76 kg Most recent weight: Weight: 76 kg     Palliative Assessment/Data:   Discussed with Dr Karleen Hampshire via secure chat  Time In: 1000 Time Out: 1110 Time Total: 70 minutes Greater than 50%  of this time was spent counseling and coordinating care related to the above assessment and plan.  Signed by: Wadie Lessen, NP   Please contact Palliative Medicine Team phone at [phone] for questions and concerns.  For individual provider: See Shea Evans

## 2021-02-02 NOTE — Evaluation (Signed)
Physical Therapy Evaluation Patient Details Name: Tara Hurst MRN: [id number] DOB: Jul 16, 1960 Today's Date: 02/02/2021   History of Present Illness  61 yo female presents to ED on 8/8 with AMS, speech difficulty, difficulty walking with recent history of fall. 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 Plan for craniotomy and resection on 8/11. PMH includes CKD3b, GAD, anemia, HTN, schizoaffective disorder, anxiety, L radial nerve palsy.   Clinical Impression  Pt presents with debility, impaired balance with R lateral leaning, difficulty performing mobility tasks, decreased activity tolerance. Pt to benefit from acute PT to address deficits. Pt requiring min-mod assist for moving to/from EOB this day, pt tolerated EOB sitting x30 seconds before fatigue. Pt also with significant tachycardia with just this mobility, 130s-140s bpm, RN aware. PT anticipates pt will need ST-SNF post-acutely, neurosurgery following for possible crani tomorrow. PT to progress mobility as tolerated, and will continue to follow acutely.      Follow Up Recommendations SNF    Equipment Recommendations  Other (comment) (tbd)    Recommendations for Other Services       Precautions / Restrictions Precautions Precautions: Fall Restrictions Weight Bearing Restrictions: No      Mobility  Bed Mobility Overal bed mobility: Needs Assistance Bed Mobility: Supine to Sit;Sit to Supine     Supine to sit: Min assist Sit to supine: Mod assist   General bed mobility comments: min-mod assist for trunk and LE management, boost up in bed upon return to supine. Pt tolerated EOB sitting x30 seconds before fatiguing and return to supine.    Transfers                 General transfer comment: nt - pt fatigued from EOB and HRmax during session 140 bpm  Ambulation/Gait                 Stairs            Wheelchair Mobility    Modified Rankin (Stroke Patients Only)       Balance Overall balance assessment: Needs assistance;History of Falls Sitting-balance support: No upper extremity supported;Feet supported Sitting balance-[name]: Poor Sitting balance - Comments: fair to poor, R lateral leaning requiring physical cues to correct Postural control: Right lateral lean     Standing balance comment: nt                             Pertinent Vitals/Pain Pain Assessment: No/denies pain    Home Living Family/patient expects to be discharged to:: Group home (per chart review, pt denies this)                      Prior Function Level of Independence: Needs assistance   Gait / Transfers Assistance Needed: pt states she was using RW PTA, states "yes" she had fallen PTA  ADL's / Homemaking Assistance Needed: Unsure of prior assist, pt unable to state        Hand Dominance   Dominant Hand: Right    Extremity/Trunk Assessment   Upper Extremity Assessment Upper Extremity Assessment: Defer to OT evaluation    Lower Extremity Assessment Lower Extremity Assessment: Generalized weakness (3/5 throughout (hip flexion/ext, knee extension/flexion, hip abd/add), except for DF/PF (~2/5) bilat)    Cervical / Trunk Assessment Cervical / Trunk Assessment: Normal  Communication   Communication: Expressive difficulties  Cognition Arousal/Alertness: Awake/alert Behavior During Therapy: Flat affect Overall Cognitive Status: No family/caregiver present to determine baseline cognitive functioning                                 General Comments: Pt states "yes" she is in the hospital, perseverative on "I need to see the doctor, I need to talk to a therapist, I forgot my medicines"      General Comments General comments (skin integrity, edema, etc.): HR 130s-140s during session, RN aware and pt in yellow MEWS prior to  PT initiating session. RN clearance obtained prior to initiating PT eval.    Exercises     Assessment/Plan    PT Assessment Patient needs continued PT services  PT Problem List Decreased mobility;Decreased strength;Decreased safety awareness;Decreased activity tolerance;Decreased balance;Decreased knowledge of use of DME;Decreased cognition;Decreased knowledge of precautions       PT Treatment Interventions DME instruction;Therapeutic activities;Gait training;Therapeutic exercise;Patient/family education;Balance training;Stair training;Functional mobility training;Neuromuscular re-education    PT Goals (Current goals can be found in the Care Plan section)  Acute Rehab PT Goals Patient Stated Goal: none explicitly stated PT Goal Formulation: With patient Time For Goal Achievement: 02/16/21 Potential to Achieve Goals: Fair    Frequency Min 3X/week   Barriers to discharge        Co-evaluation               AM-PAC PT "6 Clicks" Mobility  Outcome Measure Help needed turning from your back to your side while in a flat bed without using bedrails?: A Little Help needed moving from lying on your back to sitting on the side of a flat bed without using bedrails?: A Little Help needed moving to and from a bed to a chair (including a wheelchair)?: A Lot Help needed standing up from a chair using your arms (e.g., wheelchair or bedside chair)?: A Lot Help needed to walk in hospital room?: A Lot Help needed climbing 3-5 steps with a railing? : Total 6 Click Score: 13    End of Session   Activity Tolerance: Patient limited by fatigue Patient left: in bed;with call bell/phone within reach;with bed alarm set Nurse Communication: Mobility status PT Visit Diagnosis: Other abnormalities of gait and mobility (R26.89);Muscle weakness (generalized) (M62.81)    Time: 1140-1157 PT Time Calculation (min) (ACUTE ONLY): 17 min   Charges:   PT Evaluation $PT Eval Low Complexity: 1 Low           Nikalas Bramel S, PT DPT Acute Rehabilitation Services Pager [phone]  Office [phone]   [place] E Ruffin Pyo 02/02/2021, 1:28 PM

## 2021-02-02 NOTE — Progress Notes (Signed)
PROGRESS NOTE    Tara Hurst  [id number] DOB: 06-20-1961 DOA: 01/31/2021 PCP: [name], MD    Chief Complaint  Patient presents with   abnormal CT    Brief Narrative:   60 year old female with past medical history of hypertension and schizoaffective disorder with stage IIIb chronic kidney disease who resides in a group home and was sent over for several weeks of trouble walking and also noted to have word finding issues.  In the emergency room, CT confirmed large 4 cm lesion in the posterior left parietal lobe with vasogenic edema and some associated hemorrhage causing mass-effect of the posterior horn and atrium of the left lateral ventricle with midline shift at 3.5 mm.  Patient started on IV steroids and admitted to the hospitalist service.  Transferred to [facility] for neurosurgical evaluation. Assessment & Plan:   Principal Problem:   Brain mass Active Problems:   Schizoaffective disorder (HCC)   Hypertension   Acute encephalopathy   GAD (generalized anxiety disorder)   Chronic kidney disease, stage 3b (HCC)   Normocytic anemia   Overweight (BMI 25.0-29.9)   5.0 x 4.9 x 3.4 cm heterogeneous solid and cystic mass lesion in the posterior left parietal lobe Neurosurgery consulted, recommended craniotomy for resection.  Patient was started on IV Decadron. Meanwhile palliative care consulted for goals of care. Patient appears to be confused at this time we will reach out to next of kin to update.    Essential hypertension Blood pressure parameters slightly elevated. As needed labetalol ordered.   Tachycardia Metoprolol on board.    Hypercalcemia Probably secondary to dehydration and poor oral intake, gently hydrate and repeat    Stage IIIa CKD Continue to monitor.   DVT prophylaxis: scd's Code Status: Full code Family Communication: None at bedside Disposition:   Status is: Inpatient  Remains inpatient appropriate because:Ongoing  diagnostic testing needed not appropriate for outpatient work up and IV treatments appropriate due to intensity of illness or inability to take PO  Dispo: The patient is from: Home              Anticipated d/c is to: SNF              Patient currently is not medically stable to d/c.   Difficult to place patient No       Consultants:  Neuro surgery Palliative care.   Procedures:none.   Antimicrobials: none.    Subjective: No new complaints.   Objective: Vitals:   02/02/21 0722 02/02/21 1112 02/02/21 1300 02/02/21 1518  BP: (!) 147/92 (!) 150/98 (!) 158/102 (!) 141/92  Pulse: (!) 101 (!) 138 (!) 130 87  Resp: '14 16 16 18  '$ Temp: 98.2 F (36.8 C) 98.8 F (37.1 C) 98.5 F (36.9 C) 99 F (37.2 C)  TempSrc: Oral Oral Oral Oral  SpO2: 98% 100% 98% 100%  Weight:      Height:        Intake/Output Summary (Last 24 hours) at 02/02/2021 1701 Last data filed at 02/02/2021 0920 Gross per 24 hour  Intake 243 ml  Output 875 ml  Net -632 ml   Filed Weights   01/31/21 1950  Weight: 76 kg    Examination:  General exam: Appears calm and comfortable  Respiratory system: Clear to auscultation. Respiratory effort normal. Cardiovascular system: S1 & S2 heard, RRR. No pedal edema. Gastrointestinal system: Abdomen is nondistended, soft and nontender Normal bowel sounds heard. Central nervous system: Alert but confused,  Extremities: Symmetric 5  x 5 power. Skin: No rashes, lesions or ulcers Psychiatry:  Mood & affect appropriate.     Data Reviewed: I have personally reviewed following labs and imaging studies  CBC: Recent Labs  Lab 01/31/21 1642 02/01/21 0508 02/02/21 0324  WBC 9.8 10.3 11.1*  NEUTROABS  --  8.4* 7.8*  HGB 12.7 12.3 12.2  HCT 39.6 38.6 37.3  MCV 84.6 84.6 82.0  PLT 236 271 [id number]    Basic Metabolic Panel: Recent Labs  Lab 01/31/21 1642 02/01/21 0508 02/02/21 0324  NA 144 146* 145  K 4.1 4.3 4.1  CL 107 108 109  CO2 '27 26 25  '$ GLUCOSE 86 106*  104*  BUN 17 20 30*  CREATININE 1.52* 1.39* 1.65*  CALCIUM 10.8* 10.9* 10.9*  MG  --  2.1 2.3    GFR: Estimated Creatinine Clearance: 37.4 mL/min (A) (by C-G formula based on SCr of 1.65 mg/dL (H)).  Liver Function Tests: Recent Labs  Lab 01/31/21 1642  AST 15  ALT 14  ALKPHOS 120  BILITOT 0.6  PROT 8.3*  ALBUMIN 4.7    CBG: No results for input(s): GLUCAP in the last 168 hours.   Recent Results (from the past 240 hour(s))  Resp Panel by RT-PCR (Flu A&B, Covid) Nasopharyngeal Swab     Status: None   Collection Time: 01/31/21  4:22 PM   Specimen: Nasopharyngeal Swab; Nasopharyngeal(NP) swabs in vial transport medium  Result Value Ref Range Status   SARS Coronavirus 2 by RT PCR NEGATIVE NEGATIVE Final    Comment: (NOTE) SARS-CoV-2 target nucleic acids are NOT DETECTED.  The SARS-CoV-2 RNA is generally detectable in upper respiratory specimens during the acute phase of infection. The lowest concentration of SARS-CoV-2 viral copies this assay can detect is 138 copies/mL. A negative result does not preclude SARS-Cov-2 infection and should not be used as the sole basis for treatment or other patient management decisions. A negative result may occur with  improper specimen collection/handling, submission of specimen other than nasopharyngeal swab, presence of viral mutation(s) within the areas targeted by this assay, and inadequate number of viral copies(<138 copies/mL). A negative result must be combined with clinical observations, patient history, and epidemiological information. The expected result is Negative.  Fact Sheet for Patients:  EntrepreneurPulse.com.au  Fact Sheet for Healthcare Providers:  IncredibleEmployment.be  This test is no t yet approved or cleared by the [place] FDA and  has been authorized for detection and/or diagnosis of SARS-CoV-2 by FDA under an Emergency Use Authorization (EUA). This EUA will remain   in effect (meaning this test can be used) for the duration of the COVID-19 declaration under Section 564(b)(1) of the Act, 21 U.S.C.section 360bbb-3(b)(1), unless the authorization is terminated  or revoked sooner.       Influenza A by PCR NEGATIVE NEGATIVE Final   Influenza B by PCR NEGATIVE NEGATIVE Final    Comment: (NOTE) The Xpert Xpress SARS-CoV-2/FLU/RSV plus assay is intended as an aid in the diagnosis of influenza from Nasopharyngeal swab specimens and should not be used as a sole basis for treatment. Nasal washings and aspirates are unacceptable for Xpert Xpress SARS-CoV-2/FLU/RSV testing.  Fact Sheet for Patients: EntrepreneurPulse.com.au  Fact Sheet for Healthcare Providers: IncredibleEmployment.be  This test is not yet approved or cleared by the [place] FDA and has been authorized for detection and/or diagnosis of SARS-CoV-2 by FDA under an Emergency Use Authorization (EUA). This EUA will remain in effect (meaning this test can be used) for the duration of  the COVID-19 declaration under Section 564(b)(1) of the Act, 21 U.S.C. section 360bbb-3(b)(1), unless the authorization is terminated or revoked.  Performed at [facility], [place] [address] [place], [place] [address]          Radiology Studies: MR BRAIN W WO CONTRAST  Result Date: 01/31/2021 CLINICAL DATA:  Brain mass. EXAM: MRI HEAD WITHOUT AND WITH CONTRAST TECHNIQUE: Multiplanar, multiecho pulse sequences of the brain and surrounding structures were obtained without and with intravenous contrast. CONTRAST:  6.60m GADAVIST GADOBUTROL 1 MMOL/ML IV SOLN COMPARISON:  CT head without contrast 01/31/2021 and 09/13/2019 FINDINGS: Brain: A heterogeneous solid and cystic multiloculated mass lesion is present in the posterior left parietal lobe. Acute blood products are noted anteriorly within the lesion. Peripheral enhancement is present. The lesion  measures 5.0 x 4.9 x 3.4 cm. Additional separate punctate focus of enhancement is noted along the medial margin of the lesion. Extensive T2 signal changes extend anteriorly in the left parietal lobe and inferiorly into the occipital and posterior temporal lobe. Mass effect is present with partial effacement of the posterior horn and atrium of the left lateral ventricle. Midline shift measures 3.5 mm. No distal scratched at no additional more distal lesions are present. Mild sulcal effacement is noted throughout the left hemisphere. No acute infarct or hemorrhage is present. Restricted diffusion is noted at the margin of the lesion. Vascular: Flow is present in the major intracranial arteries. Skull and upper cervical spine: Degenerative changes noted in the upper cervical spine. Slight anterolisthesis is present at C3-4. Fusion is present across the disc space at C3-4. Marrow signal is normal. Midline structures are otherwise within normal limits. Sinuses/Orbits: The paranasal sinuses and mastoid air cells are clear. Globes are deviated to the right. Globes and orbits are otherwise unremarkable. IMPRESSION: 1. 5.0 x 4.9 x 3.4 cm heterogeneous solid and cystic mass lesion in the posterior left parietal lobe with extensive T2 signal changes extending anteriorly into the left parietal lobe and inferiorly into the occipital and posterior temporal lobe. Acute blood products are present in the anterior aspect of the lesion. Findings are most concerning for a high-grade primary CNS neoplasm such as a GBM. Metastatic disease is considered less likely. 2. Mass effect with partial effacement of the posterior horn and atrium of the left lateral ventricle. Midline shift measures 3.5 mm. Electronically Signed   By: CSan MorelleM.D.   On: 01/31/2021 19:33        Scheduled Meds:  dexamethasone (DECADRON) injection  4 mg Intravenous Q6H   metoprolol tartrate  12.5 mg Oral BID   sodium chloride flush  3 mL  Intravenous Q12H   Continuous Infusions:   ceFAZolin (ANCEF) IV       LOS: 2 days        VHosie Poisson MD [facility]   To contact the attending provider between 7A-7P or the covering provider during after hours 7P-7A, please log into the web site [email or web address] and access using universal [place] password for that web site. If you do not have the password, please call the hospital operator.  02/02/2021, 5:01 PM

## 2021-02-03 ENCOUNTER — Encounter (HOSPITAL_COMMUNITY): Payer: Self-pay | Admitting: Certified Registered Nurse Anesthetist

## 2021-02-03 DIAGNOSIS — Z66 Do not resuscitate: Secondary | ICD-10-CM

## 2021-02-03 LAB — BASIC METABOLIC PANEL
Anion gap: 8 (ref 5–15)
BUN: 33 mg/dL — ABNORMAL HIGH (ref 6–20)
CO2: 26 mmol/L (ref 22–32)
Calcium: 10.9 mg/dL — ABNORMAL HIGH (ref 8.9–10.3)
Chloride: 113 mmol/L — ABNORMAL HIGH (ref 98–111)
Creatinine, Ser: 1.61 mg/dL — ABNORMAL HIGH (ref 0.44–1.00)
GFR, Estimated: 37 mL/min — ABNORMAL LOW (ref >60)
Glucose, Bld: 136 mg/dL — ABNORMAL HIGH (ref 70–99)
Potassium: 4.4 mmol/L (ref 3.5–5.1)
Sodium: 147 mmol/L — ABNORMAL HIGH (ref 135–145)

## 2021-02-03 LAB — CBC WITH DIFFERENTIAL/PLATELET
Abs Immature Granulocytes: 0.06 10*3/uL (ref 0.00–0.07)
Basophils Absolute: 0 10*3/uL (ref 0.0–0.1)
Basophils Relative: 0 %
Eosinophils Absolute: 0 10*3/uL (ref 0.0–0.5)
Eosinophils Relative: 0 %
HCT: 36.8 % (ref 36.0–46.0)
Hemoglobin: 12 g/dL (ref 12.0–15.0)
Immature Granulocytes: 1 %
Lymphocytes Relative: 10 %
Lymphs Abs: 1.1 10*3/uL (ref 0.7–4.0)
MCH: 27.2 pg (ref 26.0–34.0)
MCHC: 32.6 g/dL (ref 30.0–36.0)
MCV: 83.4 fL (ref 80.0–100.0)
Monocytes Absolute: 0.5 10*3/uL (ref 0.1–1.0)
Monocytes Relative: 4 %
Neutro Abs: 9.9 10*3/uL — ABNORMAL HIGH (ref 1.7–7.7)
Neutrophils Relative %: 85 %
Platelets: 272 10*3/uL (ref 150–400)
RBC: 4.41 MIL/uL (ref 3.87–5.11)
RDW: 13.5 % (ref 11.5–15.5)
WBC: 11.5 10*3/uL — ABNORMAL HIGH (ref 4.0–10.5)
nRBC: 0 % (ref 0.0–0.2)

## 2021-02-03 LAB — TYPE AND SCREEN
ABO/RH(D): O POS
Antibody Screen: NEGATIVE

## 2021-02-03 LAB — RESP PANEL BY RT-PCR (FLU A&B, COVID) ARPGX2
Influenza A by PCR: NEGATIVE
Influenza B by PCR: NEGATIVE
SARS Coronavirus 2 by RT PCR: NEGATIVE

## 2021-02-03 LAB — MAGNESIUM: Magnesium: 2.2 mg/dL (ref 1.7–2.4)

## 2021-02-03 LAB — ABO/RH: ABO/RH(D): O POS

## 2021-02-03 MED ORDER — DEXAMETHASONE 4 MG PO TABS
4.0000 mg | ORAL_TABLET | Freq: Two times a day (BID) | ORAL | Status: DC
  Administered 2021-02-03 – 2021-02-04 (×4): 4 mg via ORAL
  Filled 2021-02-03 (×4): qty 1

## 2021-02-03 NOTE — Progress Notes (Signed)
OT Cancellation Note  Patient Details Name: Tara Hurst MRN: [id number] DOB: June 04, 1961   Cancelled Treatment:    Reason Eval/Treat Not Completed: Patient at procedure or test/ unavailable.  Patient is off the floor for a procedure.  Continue efforts as appropriate.    Lewis Keats D Shacola Schussler 02/03/2021, 11:08 AM 02/03/2021  Denice Paradise, OTR/L  Acute Rehabilitation Services  Office:  [phone]

## 2021-02-03 NOTE — Progress Notes (Signed)
PPT Cancellation Note  Patient Details Name: Tara Hurst MRN: [id number] DOB: Jun 19, 1961   Cancelled Treatment:    Reason Eval/Treat Not Completed: Patient at procedure or test/unavailable Patient off unit for procedure. PT will re-attempt following procedure.   Willim Turnage A. Gilford Rile PT, DPT Acute Rehabilitation Services Pager [phone] Office [phone]    Linna Hoff 02/03/2021, 11:42 AM

## 2021-02-03 NOTE — Progress Notes (Signed)
Subjective: Patient reports no headaches  Objective: Vital signs in last 24 hours: Temp:  [98.3 F (36.8 C)-99 F (37.2 C)] 98.9 F (37.2 C) (08/11 0834) Pulse Rate:  [79-138] 90 (08/11 0834) Resp:  [16-18] 18 (08/11 0834) BP: (130-166)/(82-117) 166/108 (08/11 0834) SpO2:  [98 %-100 %] 99 % (08/11 0834)  Intake/Output from previous day: 08/10 0701 - 08/11 0700 In: 563 [P.O.:560; I.V.:3] Out: 700 [Urine:700] Intake/Output this shift: No intake/output data recorded.  NAD Awake, alert.  Some word finding difficulty as well as difficulty with complex commands Right pronator drift, right leg 3/5  Lab Results: Recent Labs    02/02/21 0324 02/03/21 0137  WBC 11.1* 11.5*  HGB 12.2 12.0  HCT 37.3 36.8  PLT 287 272   BMET Recent Labs    02/02/21 0324 02/03/21 0137  NA 145 147*  K 4.1 4.4  CL 109 113*  CO2 25 26  GLUCOSE 104* 136*  BUN 30* 33*  CREATININE 1.65* 1.61*  CALCIUM 10.9* 10.9*    Studies/Results: No results found.  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  Will attempt to call her again   Tara Hurst 02/03/2021, 9:40 AM

## 2021-02-03 NOTE — Progress Notes (Signed)
Manufacturing engineer [facility]) Hospital Liaison: RN note     Notified by Transition of Care Manger of patient/family request for [facility] services at [facility] after discharge. Chart and patient information under review by [facility] physician. Hospice eligibility pending currently.     Writer left two messages for [name], also called Izora Ribas, but unable to leave message due to full mailbox. [facility] Liaisons will continue to reach out to verify interest in hospice support and to get pt established with services.  Please send signed and completed DNR form home with patient/family.   Please provide prescriptions for mediations at discharge to ensure comfort until patient can be admitted onto hospice services.   DME needs have been discussed with Kathlee Nations, TOC.  A hospital bed will be needed. [facility] equipment manager has been notified and will contact DME provider to arrange delivery to [employer].      [facility] aware of the above. Please notify [facility] when patient is ready to leave the unit at discharge. (Call [phone] or [phone] after 5pm.)  [facility] information and contact numbers left in two voicemails for [name].  Please do not hesitate to call with questions.   Thank you for the opportunity to participate in this patient's care.  Domenic Moras, BSN, RN       [place] (listed on AMION under [phone]  [phone] (24h on call)

## 2021-02-03 NOTE — Progress Notes (Signed)
Patient ID: Tara Hurst, female   DOB: 20-Sep-1960, 60 y.o.   MRN: [id number]     Progress Note from the Palliative Medicine Team at [facility]   Patient Name: Tara Hurst        Date: 02/03/2021 DOB: 31-Mar-1961  Age: 60 y.o. MRN#: [id number] Attending Physician: Hosie Poisson, MD Primary Care Physician: [name], MD Admit Date: 01/31/2021   Medical records reviewed, discussed with team.     60 y.o. female   admitted on 01/31/2021 with PMH significant for CKD3b, GAD, anemia, HTN, schizoaffective disorder who resides at a group home [facility]) and was sent to the ER due to difficulty ambulating over the past 2 weeks.  Reported patient leaning to the right and a fall, also speech has become more difficult to understand.   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  Concern is for an underlying malignant brain tumor such as glioblastoma.    Currently patient is awake and alert however she is only oriented to person.  She has no insight into this hospitalization, associated diagnosis or advanced directive decisions.     Christa lives in an assisted living,  Izora Ribas is the contact at the facility. [name], [phone], is a Education officer, museum who worked with Onalee Hua in the past and has maintained a relationship with her over the last 5 years.  I spoke to Izora Ribas at the assisted living and she confirms Tara Hurst's relationship with [place].  This NP visited patient at the bedside and met as scheduled with Tara Hurst/patient's main support person and medical decision maker at this time.  Tara Mackie is an individual with an established relationship with the patient who is acting in good faith and can reliably convey the wishes of the patient.  Education offered regarding diagnosis, prognosis, goals  of care, advance care planning decisions, end-of-life wishes, disposition and options.  Neurosurgery Dr. Marcello Moores discussed case.  Recommendation made for craniotomy for resection not only for diagnosis but therapeutic benefit of debulking.  Prognosis discussed, long-term prognosis is likely limited with or without surgery.  Continued conversation with palliative medicine NP.  Tara Mackie has had detailed discussion with Dr. Bradd Burner, physician at [facility] regarding patient's current medical situation.  Ms. Grandville Silos wants to make the very best decisions for [facility].    Understanding Mara's underlying psychiatric and cognitive conditions Tara Mackie feels that it is in the patient's best interest to forego surgery and life-prolonging measures allowing for a natural death.  Education offered on the likely trajectory and expectations at end-of-life 2/2 brain mass.  Prognosis is less than 6 months but discussion had that it could possibly be much shorter than that secondary to an aggressive brain tumor.     MOST form completed  Plan of Care:        -DNR/DNI -no medical intervention for newly diagnosis brain mass -no artifical feeding or hydration now or in the future -Transition patient back to assisted living with hospice services in place -symptom management- continue decadron  -Avoid rehospitalization - Comfort and dignity are the focus of care, allowing for a natural death.    Questions and concerns addressed   Discussed with Dr Karleen Hampshire   Total time spent on the unit was 60 minutes  Greater than 50% of the time was spent in counseling  and coordination of care  Wadie Lessen NP  Palliative Medicine Team Team Phone # [phone] Pager [phone]

## 2021-02-03 NOTE — Progress Notes (Signed)
PROGRESS NOTE    ALAMEDA MOLINO  [id number] DOB: 06/18/1961 DOA: 01/31/2021 PCP: [name], MD    Chief Complaint  Patient presents with   abnormal CT    Brief Narrative:   60 year old female with past medical history of hypertension and schizoaffective disorder with stage IIIb chronic kidney disease who resides in a group home and was sent over for several weeks of trouble walking and also noted to have word finding issues.  In the emergency room, CT confirmed large 4 cm lesion in the posterior left parietal lobe with vasogenic edema and some associated hemorrhage causing mass-effect of the posterior horn and atrium of the left lateral ventricle with midline shift at 3.5 mm.  Patient started on IV steroids and admitted to the hospitalist service.  Transferred to [facility] for neurosurgical evaluation. Assessment & Plan:   Principal Problem:   Brain mass Active Problems:   Schizoaffective disorder (HCC)   Hypertension   Acute encephalopathy   GAD (generalized anxiety disorder)   Chronic kidney disease, stage 3b (HCC)   Normocytic anemia   Overweight (BMI 25.0-29.9)   5.0 x 4.9 x 3.4 cm heterogeneous solid and cystic mass lesion in the posterior left parietal lobe Neurosurgery consulted, recommended craniotomy for resection.  Patient was started on IV Decadron. Meanwhile palliative care consulted for goals of care. After further discussions, plan to transition to hospice, no surgical intervention at this time. Will transition to oral decadron and plan to discharge the patient back to ALF/ group home with hospice. Patient appears to be confused at this time we will reach out to next of kin to update.    Essential hypertension Blood pressure parameters slightly elevated. On metoprolol.  As needed labetalol ordered.   Tachycardia Metoprolol on board.    Hypercalcemia Probably secondary to dehydration and poor oral intake, . ? Metastatic disease.     Stage  IIIa CKD Continue to monitor.   DVT prophylaxis: scd's Code Status: Full code Family Communication: None at bedside Disposition:   Status is: Inpatient  Remains inpatient appropriate because:Ongoing diagnostic testing needed not appropriate for outpatient work up and IV treatments appropriate due to intensity of illness or inability to take PO  Dispo: The patient is from: ALF              Anticipated d/c is to: ALF              Patient currently is medically stable to d/c.   Difficult to place patient No       Consultants:  Neuro surgery Palliative care.   Procedures:none.   Antimicrobials: none.    Subjective: No new complaints.  Objective: Vitals:   02/02/21 2300 02/02/21 2322 02/03/21 0341 02/03/21 0834  BP: (!) 130/100 (!) 139/110 (!) 146/82 (!) 166/108  Pulse:  94 79 90  Resp:   17 18  Temp:  98.9 F (37.2 C) 98.3 F (36.8 C) 98.9 F (37.2 C)  TempSrc:  Oral Oral Oral  SpO2:  99% 99% 99%  Weight:      Height:        Intake/Output Summary (Last 24 hours) at 02/03/2021 1220 Last data filed at 02/03/2021 0900 Gross per 24 hour  Intake 320 ml  Output 700 ml  Net -380 ml    Filed Weights   01/31/21 1950  Weight: 76 kg    Examination:  General exam:  alert and comfortable.  Respiratory system:  clear to auscultation, no wheezing or rhonchi.  Cardiovascular system: S1S2 RRR, no pedal edema.  Gastrointestinal system: Abdomen is soft, non tender bowel sounds wnl.  Central nervous system: Alert , but confused, able to move all extremities.  Extremities: NO pedal edema.  Skin: No rashes seen.  Psychiatry:  Mood  is appropriate.     Data Reviewed: I have personally reviewed following labs and imaging studies  CBC: Recent Labs  Lab 01/31/21 1642 02/01/21 0508 02/02/21 0324 02/03/21 0137  WBC 9.8 10.3 11.1* 11.5*  NEUTROABS  --  8.4* 7.8* 9.9*  HGB 12.7 12.3 12.2 12.0  HCT 39.6 38.6 37.3 36.8  MCV 84.6 84.6 82.0 83.4  PLT 236 271 287 272      Basic Metabolic Panel: Recent Labs  Lab 01/31/21 1642 02/01/21 0508 02/02/21 0324 02/03/21 0137  NA 144 146* 145 147*  K 4.1 4.3 4.1 4.4  CL 107 108 109 113*  CO2 '27 26 25 26  '$ GLUCOSE 86 106* 104* 136*  BUN 17 20 30* 33*  CREATININE 1.52* 1.39* 1.65* 1.61*  CALCIUM 10.8* 10.9* 10.9* 10.9*  MG  --  2.1 2.3 2.2     GFR: Estimated Creatinine Clearance: 38.4 mL/min (A) (by C-G formula based on SCr of 1.61 mg/dL (H)).  Liver Function Tests: Recent Labs  Lab 01/31/21 1642  AST 15  ALT 14  ALKPHOS 120  BILITOT 0.6  PROT 8.3*  ALBUMIN 4.7     CBG: No results for input(s): GLUCAP in the last 168 hours.   Recent Results (from the past 240 hour(s))  Resp Panel by RT-PCR (Flu A&B, Covid) Nasopharyngeal Swab     Status: None   Collection Time: 01/31/21  4:22 PM   Specimen: Nasopharyngeal Swab; Nasopharyngeal(NP) swabs in vial transport medium  Result Value Ref Range Status   SARS Coronavirus 2 by RT PCR NEGATIVE NEGATIVE Final    Comment: (NOTE) SARS-CoV-2 target nucleic acids are NOT DETECTED.  The SARS-CoV-2 RNA is generally detectable in upper respiratory specimens during the acute phase of infection. The lowest concentration of SARS-CoV-2 viral copies this assay can detect is 138 copies/mL. A negative result does not preclude SARS-Cov-2 infection and should not be used as the sole basis for treatment or other patient management decisions. A negative result may occur with  improper specimen collection/handling, submission of specimen other than nasopharyngeal swab, presence of viral mutation(s) within the areas targeted by this assay, and inadequate number of viral copies(<138 copies/mL). A negative result must be combined with clinical observations, patient history, and epidemiological information. The expected result is Negative.  Fact Sheet for Patients:  EntrepreneurPulse.com.au  Fact Sheet for Healthcare Providers:   IncredibleEmployment.be  This test is no t yet approved or cleared by the [place] FDA and  has been authorized for detection and/or diagnosis of SARS-CoV-2 by FDA under an Emergency Use Authorization (EUA). This EUA will remain  in effect (meaning this test can be used) for the duration of the COVID-19 declaration under Section 564(b)(1) of the Act, 21 U.S.C.section 360bbb-3(b)(1), unless the authorization is terminated  or revoked sooner.       Influenza A by PCR NEGATIVE NEGATIVE Final   Influenza B by PCR NEGATIVE NEGATIVE Final    Comment: (NOTE) The Xpert Xpress SARS-CoV-2/FLU/RSV plus assay is intended as an aid in the diagnosis of influenza from Nasopharyngeal swab specimens and should not be used as a sole basis for treatment. Nasal washings and aspirates are unacceptable for Xpert Xpress SARS-CoV-2/FLU/RSV testing.  Fact Sheet for Patients: EntrepreneurPulse.com.au  Fact Sheet for Healthcare Providers: IncredibleEmployment.be  This test is not yet approved or cleared by the [place] FDA and has been authorized for detection and/or diagnosis of SARS-CoV-2 by FDA under an Emergency Use Authorization (EUA). This EUA will remain in effect (meaning this test can be used) for the duration of the COVID-19 declaration under Section 564(b)(1) of the Act, 21 U.S.C. section 360bbb-3(b)(1), unless the authorization is terminated or revoked.  Performed at [facility], [place] [address] [place], [place] [address]           Radiology Studies: No results found.      Scheduled Meds:  dexamethasone  4 mg Oral Q12H   metoprolol tartrate  12.5 mg Oral BID   sodium chloride flush  3 mL Intravenous Q12H   Continuous Infusions:     LOS: 3 days        Hosie Poisson, MD [facility]   To contact the attending provider between 7A-7P or the covering provider during after  hours 7P-7A, please log into the web site [email or web address] and access using universal [place] password for that web site. If you do not have the password, please call the hospital operator.  02/03/2021, 12:20 PM

## 2021-02-04 ENCOUNTER — Encounter (HOSPITAL_COMMUNITY)
Admission: EM | Disposition: A | Discharge status: Skilled Nursing Facility | Payer: Self-pay | Source: Home / Self Care | Attending: Internal Medicine

## 2021-02-04 SURGERY — CRANIOTOMY TUMOR EXCISION
Anesthesia: General | Laterality: Left

## 2021-02-04 MED ORDER — DEXAMETHASONE 4 MG PO TABS
4.0000 mg | ORAL_TABLET | Freq: Two times a day (BID) | ORAL | 0 refills | Status: DC
Start: 2021-02-04 — End: 2024-03-18

## 2021-02-04 MED ORDER — METOPROLOL TARTRATE 25 MG PO TABS: 12.5000 mg | ORAL_TABLET | Freq: Two times a day (BID) | ORAL | 0 refills | Status: DC

## 2021-02-04 NOTE — Progress Notes (Addendum)
Manufacturing engineer [facility])  Patient was approved for hospice services.   DME is set to be delivered to her facility today by 3 pm.  Thank you, Venia Carbon RN, BSN, [facility] Liaison

## 2021-02-04 NOTE — TOC Transition Note (Signed)
Transition of Care [facility]) - CM/SW Discharge Note   Patient Details  Name: [name] MRN: [id number] Date of Birth: 07-18-1960  Transition of Care [facility] CM/SW Contact:  Geralynn Ochs, LCSW Phone Number: 02/04/2021, 2:24 PM   Clinical Narrative:   Nurse to call report to [phone].    Final next level of care: Home w Hospice Care Barriers to Discharge: Barriers Resolved   Patient Goals and CMS Choice Patient states their goals for this hospitalization and ongoing recovery are:: patient unable to participate in goal setting, only oriented to self      Discharge Placement              Patient chooses bed at:  [facility]) Patient to be transferred to facility by: [facility] Name of family member notified: Attempted to contact Linus Orn, unable to reach Patient and family notified of of transfer: 02/04/21  Discharge Plan and Services                                     Social Determinants of Health (SDOH) Interventions     Readmission Risk Interventions No flowsheet data found.

## 2021-02-04 NOTE — Progress Notes (Signed)
RN attempted to give report to receiving facility with no success. RN will attempt again.

## 2021-02-04 NOTE — NC FL2 (Signed)
[place] LEVEL OF CARE SCREENING TOOL     IDENTIFICATION  Patient Name: Tara Hurst Birthdate: Jan 17, 1961 Sex: female Admission Date (Current Location): 01/31/2021  [facility] and [place] Number:  Herbalist and Address:  [facility] [place], [place] [address], [place], [place] [address]      Provider Number: [id number]  Attending Physician Name and Address:  Hosie Poisson, MD  Relative Name and Phone Number:       Current Level of Care: Hospital Recommended Level of Care: [place] Prior Approval Number:    Date Approved/Denied:   PASRR Number:    Discharge Plan: Other (Comment) (ALF)    Current Diagnoses: Patient Active Problem List   Diagnosis Date Noted   Overweight (BMI 25.0-29.9) 02/01/2021   Brain mass 01/31/2021   Chronic kidney disease, stage 3b ([place]) 01/31/2021   Normocytic anemia 01/31/2021   Radial nerve palsy, 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   Malnutrition of moderate degree 06/14/2018   AKI (acute kidney injury) ([place]) 06/06/2018   Sepsis due to undetermined organism ([place]) 06/06/2018   Tachycardia 12/03/2017   Acute lower UTI 12/03/2017   GAD (generalized anxiety disorder) 10/19/2017   Anxiety    UTI due to Klebsiella species    Hypernatremia    Acute encephalopathy 05/03/2017   ARF (acute renal failure) ([place]) 05/03/2017   HCAP (healthcare-associated pneumonia) 09/25/2013   Schizoaffective disorder ([place]) 09/25/2013   Hypertension 09/25/2013   Abnormal lithium level in blood 09/25/2013    Orientation RESPIRATION BLADDER Height & Weight     Self  Normal Incontinent Weight: 167 lb 8.8 oz (76 kg) Height:  '5\' 5"'$  (165.1 cm)  BEHAVIORAL SYMPTOMS/MOOD NEUROLOGICAL BOWEL NUTRITION STATUS      Continent Diet (heart healthy)  AMBULATORY STATUS COMMUNICATION OF NEEDS Skin   Extensive Assist Verbally Normal                       Personal Care Assistance Level of Assistance  Bathing, Feeding, Dressing Bathing  Assistance: Maximum assistance Feeding assistance: Limited assistance Dressing Assistance: Maximum assistance     Functional Limitations Info  Speech     Speech Info: Impaired (dysarthria)    SPECIAL CARE FACTORS FREQUENCY                       Contractures Contractures Info: Not present    Additional Factors Info  Code Status, Allergies Code Status Info: DNR Allergies Info: NKA           Current Medications (02/04/2021):  This is the current hospital active medication list Current Facility-Administered Medications  Medication Dose Route Frequency Provider Last Rate Last Admin   dexamethasone (DECADRON) tablet 4 mg  4 mg Oral Q12H Hosie Poisson, MD   4 mg at 02/03/21 2205   hydrALAZINE (APRESOLINE) injection 10 mg  10 mg Intravenous Q4H PRN Dwyane Dee, MD   10 mg at 02/04/21 0856   labetalol (NORMODYNE) injection 10 mg  10 mg Intravenous Q4H PRN Dwyane Dee, MD   10 mg at 02/04/21 0752   metoprolol tartrate (LOPRESSOR) tablet 12.5 mg  12.5 mg Oral BID Hosie Poisson, MD   12.5 mg at 02/03/21 2205   sodium chloride flush (NS) 0.9 % injection 3 mL  3 mL Intravenous Eddie Candle, MD   3 mL at 02/03/21 2206     Discharge Medications: TAKE these medications     clonazePAM 0.5 MG tablet Commonly known  as: KLONOPIN Take 1 tablet (0.5 mg total) by mouth 2 (two) times daily.    dexamethasone 4 MG tablet Commonly known as: DECADRON Take 1 tablet (4 mg total) by mouth every 12 (twelve) hours.    metoprolol tartrate 25 MG tablet Commonly known as: LOPRESSOR Take 0.5 tablets (12.5 mg total) by mouth 2 (two) times daily.    OLANZapine 20 MG tablet Commonly known as: ZYPREXA Take 20 mg by mouth at bedtime.    polyethylene glycol 17 g packet Commonly known as: MiraLax Take 17 g by mouth daily. What changed: when to take this    Vitamin D (Ergocalciferol) 1.25 MG (50000 UNIT) Caps capsule Commonly known as: DRISDOL Take 50,000 Units by mouth every 7  (seven) days. Wednesday    Relevant Imaging Results:  Relevant Lab Results:   Additional Information SS#: [id number]  Geralynn Ochs, LCSW

## 2021-02-04 NOTE — Discharge Summary (Signed)
Physician Discharge Summary  Tara Hurst [id number] DOB: 1960/07/01 DOA: 01/31/2021  PCP: [name], MD  Admit date: 01/31/2021 Discharge date: 02/04/2021  Admitted From: ALF Disposition:  ALF with hospice.   Recommendations for Outpatient Follow-up:  Follow up with hospice MD as recommended.   Discharge Condition:Hospice.  CODE STATUS:DNR Diet recommendation: Heart Healthy   Brief/Interim Summary: 60 year old female with past medical history of hypertension and schizoaffective disorder with stage IIIb chronic kidney disease who resides in a group home and was sent over for several weeks of trouble walking and also noted to have word finding issues.  In the emergency room, CT confirmed large 4 cm lesion in the posterior left parietal lobe with vasogenic edema and some associated hemorrhage causing mass-effect of the posterior horn and atrium of the left lateral ventricle with midline shift at 3.5 mm.  Patient started on IV steroids and admitted to the hospitalist service.  Transferred to [facility] for neurosurgical evaluation.  Discharge Diagnoses:  Principal Problem:   Brain mass Active Problems:   Schizoaffective disorder (HCC)   Hypertension   Acute encephalopathy   GAD (generalized anxiety disorder)   Chronic kidney disease, stage 3b (HCC)   Normocytic anemia   Overweight (BMI 25.0-29.9)  5.0 x 4.9 x 3.4 cm heterogeneous solid and cystic mass lesion in the posterior left parietal lobe Neurosurgery consulted, recommended craniotomy for resection.  Patient was started on IV Decadron. Meanwhile palliative care consulted for goals of care. After further discussions, plan to transition to hospice, no surgical intervention at this time. Will transition to oral decadron and plan to discharge the patient back to ALF/ group home with hospice when hospital bed is delivered.         Essential hypertension Blood pressure parameters  elevated. On metoprolol.        Tachycardia Metoprolol on board.       Hypercalcemia Probably secondary to dehydration and poor oral intake, . ? Metastatic disease.        Stage IIIa CKD Continue to monitor.    Discharge Instructions  Discharge Instructions     Diet - low sodium heart healthy   Complete by: As directed    Increase activity slowly   Complete by: As directed       Allergies as of 02/04/2021   No Known Allergies      Medication List     STOP taking these medications    acetaminophen 650 MG CR tablet Commonly known as: TYLENOL   aspirin EC 81 MG tablet   atorvastatin 20 MG tablet Commonly known as: LIPITOR   benztropine 0.5 MG tablet Commonly known as: COGENTIN   buPROPion 150 MG 24 hr tablet Commonly known as: WELLBUTRIN XL   Invega Sustenna 234 MG/1.5ML Susy injection Generic drug: paliperidone   propranolol 10 MG tablet Commonly known as: INDERAL   risperiDONE 3 MG tablet Commonly known as: RISPERDAL       TAKE these medications    clonazePAM 0.5 MG tablet Commonly known as: KLONOPIN Take 1 tablet (0.5 mg total) by mouth 2 (two) times daily.   dexamethasone 4 MG tablet Commonly known as: DECADRON Take 1 tablet (4 mg total) by mouth every 12 (twelve) hours.   metoprolol tartrate 25 MG tablet Commonly known as: LOPRESSOR Take 0.5 tablets (12.5 mg total) by mouth 2 (two) times daily.   OLANZapine 20 MG tablet Commonly known as: ZYPREXA Take 20 mg by mouth at bedtime.   polyethylene glycol 17 g packet  Commonly known as: MiraLax Take 17 g by mouth daily. What changed: when to take this   Vitamin D (Ergocalciferol) 1.25 MG (50000 UNIT) Caps capsule Commonly known as: DRISDOL Take 50,000 Units by mouth every 7 (seven) days. Wednesday        Follow-up Information     [name], MD. Schedule an appointment as soon as possible for a visit in 1 week(s).   Specialty: Pulmonary Disease Contact information: [place] [address] [phone]                No Known Allergies  Consultations: Palliative,  Neuro surgery.    Procedures/Studies: CT HEAD WO CONTRAST  Result Date: 01/31/2021 CLINICAL DATA:  Headache when lying down.  Right hemiparesis. EXAM: CT HEAD WITHOUT CONTRAST TECHNIQUE: Contiguous axial images were obtained from the base of the skull through the vertex without intravenous contrast. COMPARISON:  09/13/2019 FINDINGS: Brain: No abnormality seen affecting the brainstem or cerebellum. Right cerebral hemisphere is normal. Within the left parietal lobe, there is a cystic mass lesion measuring at least 4 cm in diameter. There is considerable regional vasogenic edema. Hemorrhage associated with the inferior margin the lesion shows an intraparenchymal hematoma measuring 2.5 x 1.5 cm in diameter. There is mass-effect upon the atrium and occipital horn of the left lateral ventricle. No hydrocephalus. No extra-axial fluid collection. Vascular: There is atherosclerotic calcification of the major vessels at the base of the brain. Skull: Normal Sinuses/Orbits: Clear/normal Other: None IMPRESSION: 4 cm cystic necrotic mass in the left parietal lobe with regional vasogenic edema and hemorrhage along the inferior margin of the mass, with the hematoma measuring 2.5 x 1.5 cm in size. 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 Metastatic disease is also possible. No second lesion is identified. Call report in progress. Electronically Signed   By: Nelson Chimes M.D.   On: 01/31/2021 14:04   MR BRAIN W WO CONTRAST  Result Date: 01/31/2021 CLINICAL DATA:  Brain mass. EXAM: MRI HEAD WITHOUT AND WITH CONTRAST TECHNIQUE: Multiplanar, multiecho pulse sequences of the brain and surrounding structures were obtained without and with intravenous contrast. CONTRAST:  6.69m GADAVIST GADOBUTROL 1 MMOL/ML IV SOLN COMPARISON:  CT head without contrast 01/31/2021 and 09/13/2019 FINDINGS:  Brain: A heterogeneous solid and cystic multiloculated mass lesion is present in the posterior left parietal lobe. Acute blood products are noted anteriorly within the lesion. Peripheral enhancement is present. The lesion measures 5.0 x 4.9 x 3.4 cm. Additional separate punctate focus of enhancement is noted along the medial margin of the lesion. Extensive T2 signal changes extend anteriorly in the left parietal lobe and inferiorly into the occipital and posterior temporal lobe. Mass effect is present with partial effacement of the posterior horn and atrium of the left lateral ventricle. Midline shift measures 3.5 mm. No distal scratched at no additional more distal lesions are present. Mild sulcal effacement is noted throughout the left hemisphere. No acute infarct or hemorrhage is present. Restricted diffusion is noted at the margin of the lesion. Vascular: Flow is present in the major intracranial arteries. Skull and upper cervical spine: Degenerative changes noted in the upper cervical spine. Slight anterolisthesis is present at C3-4. Fusion is present across the disc space at C3-4. Marrow signal is normal. Midline structures are otherwise within normal limits. Sinuses/Orbits: The paranasal sinuses and mastoid air cells are clear. Globes are deviated to the right. Globes and orbits are otherwise unremarkable. IMPRESSION: 1. 5.0 x 4.9 x 3.4 cm  heterogeneous solid and cystic mass lesion in the posterior left parietal lobe with extensive T2 signal changes extending anteriorly into the left parietal lobe and inferiorly into the occipital and posterior temporal lobe. Acute blood products are present in the anterior aspect of the lesion. Findings are most concerning for a high-grade primary CNS neoplasm such as a GBM. Metastatic disease is considered less likely. 2. Mass effect with partial effacement of the posterior horn and atrium of the left lateral ventricle. Midline shift measures 3.5 mm. Electronically Signed    By: San Morelle M.D.   On: 01/31/2021 19:33   DG Chest Port 1 View  Result Date: 01/31/2021 CLINICAL DATA:  Weakness EXAM: PORTABLE CHEST 1 VIEW COMPARISON:  June 08, 2018 FINDINGS: The heart size and mediastinal contours are within normal limits. Both lungs are clear. The visualized skeletal structures are unremarkable. IMPRESSION: No active disease. Electronically Signed   By: Dahlia Bailiff MD   On: 01/31/2021 16:33   DG HIP UNILAT WITH PELVIS 2-3 VIEWS LEFT  Result Date: 01/31/2021 CLINICAL DATA:  left hip pain and thigh pain; right hip pain and thigh pain EXAM: DG HIP (WITH OR WITHOUT PELVIS) 2-3V LEFT; DG HIP (WITH OR WITHOUT PELVIS) 2-3V RIGHT COMPARISON:  None FINDINGS: There is no acute fracture or dislocation. There is mild bilateral hip degenerative change. Mild findings of osteitis pubis. 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 Electronically Signed   By: Maurine Simmering   On: 01/31/2021 14:02   DG HIP UNILAT WITH PELVIS 2-3 VIEWS RIGHT  Result Date: 01/31/2021 CLINICAL DATA:  left hip pain and thigh pain; right hip pain and thigh pain EXAM: DG HIP (WITH OR WITHOUT PELVIS) 2-3V LEFT; DG HIP (WITH OR WITHOUT PELVIS) 2-3V RIGHT COMPARISON:  None FINDINGS: There is no acute fracture or dislocation. There is mild bilateral hip degenerative change. Mild findings of osteitis pubis. 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 Electronically Signed   By: Maurine Simmering   On: 01/31/2021 14:02      Subjective:  Comfortable.  Discharge Exam: Vitals:   02/04/21 0737 02/04/21 0850  BP: (!) 178/109 (!) 170/100  Pulse: (!) 106   Resp: 18   Temp: 99 F (37.2 C)   SpO2: 100%    Vitals:   02/03/21 1957 02/03/21 2354 02/04/21 0737 02/04/21 0850  BP: (!) 149/80 (!) 163/92 (!) 178/109 (!) 170/100  Pulse: (!) 102 (!) 106 (!) 106   Resp: '18 18 18   '$ Temp: 98 F (36.7 C) 98.1 F (36.7 C) 99 F (37.2 C)   TempSrc: Oral Oral Oral   SpO2: 100% 100% 100%    Weight:      Height:        General: Pt is alert, awake, not in acute distress Cardiovascular: RRR, S1/S2 +, no rubs, no gallops Respiratory: CTA bilaterally, no wheezing, no rhonchi Abdominal: Soft, NT, ND, bowel sounds + Extremities: no edema, no cyanosis    The results of significant diagnostics from this hospitalization (including imaging, microbiology, ancillary and laboratory) are listed below for reference.     Microbiology: Recent Results (from the past 240 hour(s))  Resp Panel by RT-PCR (Flu A&B, Covid) Nasopharyngeal Swab     Status: None   Collection Time: 01/31/21  4:22 PM   Specimen: Nasopharyngeal Swab; Nasopharyngeal(NP) swabs in vial transport medium  Result Value Ref Range Status   SARS Coronavirus 2 by RT PCR NEGATIVE NEGATIVE Final    Comment: (NOTE) SARS-CoV-2 target  nucleic acids are NOT DETECTED.  The SARS-CoV-2 RNA is generally detectable in upper respiratory specimens during the acute phase of infection. The lowest concentration of SARS-CoV-2 viral copies this assay can detect is 138 copies/mL. A negative result does not preclude SARS-Cov-2 infection and should not be used as the sole basis for treatment or other patient management decisions. A negative result may occur with  improper specimen collection/handling, submission of specimen other than nasopharyngeal swab, presence of viral mutation(s) within the areas targeted by this assay, and inadequate number of viral copies(<138 copies/mL). A negative result must be combined with clinical observations, patient history, and epidemiological information. The expected result is Negative.  Fact Sheet for Patients:  EntrepreneurPulse.com.au  Fact Sheet for Healthcare Providers:  IncredibleEmployment.be  This test is no t yet approved or cleared by the [place] FDA and  has been authorized for detection and/or diagnosis of SARS-CoV-2 by FDA under an Emergency  Use Authorization (EUA). This EUA will remain  in effect (meaning this test can be used) for the duration of the COVID-19 declaration under Section 564(b)(1) of the Act, 21 U.S.C.section 360bbb-3(b)(1), unless the authorization is terminated  or revoked sooner.       Influenza A by PCR NEGATIVE NEGATIVE Final   Influenza B by PCR NEGATIVE NEGATIVE Final    Comment: (NOTE) The Xpert Xpress SARS-CoV-2/FLU/RSV plus assay is intended as an aid in the diagnosis of influenza from Nasopharyngeal swab specimens and should not be used as a sole basis for treatment. Nasal washings and aspirates are unacceptable for Xpert Xpress SARS-CoV-2/FLU/RSV testing.  Fact Sheet for Patients: EntrepreneurPulse.com.au  Fact Sheet for Healthcare Providers: IncredibleEmployment.be  This test is not yet approved or cleared by the [place] FDA and has been authorized for detection and/or diagnosis of SARS-CoV-2 by FDA under an Emergency Use Authorization (EUA). This EUA will remain in effect (meaning this test can be used) for the duration of the COVID-19 declaration under Section 564(b)(1) of the Act, 21 U.S.C. section 360bbb-3(b)(1), unless the authorization is terminated or revoked.  Performed at [facility], [place] [address], [place], [place] [address]   Resp Panel by RT-PCR (Flu A&B, Covid) Nasopharyngeal Swab     Status: None   Collection Time: 02/03/21  1:54 PM   Specimen: Nasopharyngeal Swab; Nasopharyngeal(NP) swabs in vial transport medium  Result Value Ref Range Status   SARS Coronavirus 2 by RT PCR NEGATIVE NEGATIVE Final    Comment: (NOTE) SARS-CoV-2 target nucleic acids are NOT DETECTED.  The SARS-CoV-2 RNA is generally detectable in upper respiratory specimens during the acute phase of infection. The lowest concentration of SARS-CoV-2 viral copies this assay can detect is 138 copies/mL. A negative result does not preclude  SARS-Cov-2 infection and should not be used as the sole basis for treatment or other patient management decisions. A negative result may occur with  improper specimen collection/handling, submission of specimen other than nasopharyngeal swab, presence of viral mutation(s) within the areas targeted by this assay, and inadequate number of viral copies(<138 copies/mL). A negative result must be combined with clinical observations, patient history, and epidemiological information. The expected result is Negative.  Fact Sheet for Patients:  EntrepreneurPulse.com.au  Fact Sheet for Healthcare Providers:  IncredibleEmployment.be  This test is no t yet approved or cleared by the [place] FDA and  has been authorized for detection and/or diagnosis of SARS-CoV-2 by FDA under an Emergency Use Authorization (EUA). This EUA will remain  in effect (meaning this test  can be used) for the duration of the COVID-19 declaration under Section 564(b)(1) of the Act, 21 U.S.C.section 360bbb-3(b)(1), unless the authorization is terminated  or revoked sooner.       Influenza A by PCR NEGATIVE NEGATIVE Final   Influenza B by PCR NEGATIVE NEGATIVE Final    Comment: (NOTE) The Xpert Xpress SARS-CoV-2/FLU/RSV plus assay is intended as an aid in the diagnosis of influenza from Nasopharyngeal swab specimens and should not be used as a sole basis for treatment. Nasal washings and aspirates are unacceptable for Xpert Xpress SARS-CoV-2/FLU/RSV testing.  Fact Sheet for Patients: EntrepreneurPulse.com.au  Fact Sheet for Healthcare Providers: IncredibleEmployment.be  This test is not yet approved or cleared by the [place] FDA and has been authorized for detection and/or diagnosis of SARS-CoV-2 by FDA under an Emergency Use Authorization (EUA). This EUA will remain in effect (meaning this test can be used) for the duration of  the COVID-19 declaration under Section 564(b)(1) of the Act, 21 U.S.C. section 360bbb-3(b)(1), unless the authorization is terminated or revoked.  Performed at [facility], [place] [address] [place], [place] [address]      Labs: BNP (last 3 results) No results for input(s): BNP in the last 8760 hours. Basic Metabolic Panel: Recent Labs  Lab 01/31/21 1642 02/01/21 0508 02/02/21 0324 02/03/21 0137  NA 144 146* 145 147*  K 4.1 4.3 4.1 4.4  CL 107 108 109 113*  CO2 '27 26 25 26  '$ GLUCOSE 86 106* 104* 136*  BUN 17 20 30* 33*  CREATININE 1.52* 1.39* 1.65* 1.61*  CALCIUM 10.8* 10.9* 10.9* 10.9*  MG  --  2.1 2.3 2.2   Liver Function Tests: Recent Labs  Lab 01/31/21 1642  AST 15  ALT 14  ALKPHOS 120  BILITOT 0.6  PROT 8.3*  ALBUMIN 4.7   No results for input(s): LIPASE, AMYLASE in the last 168 hours. No results for input(s): AMMONIA in the last 168 hours. CBC: Recent Labs  Lab 01/31/21 1642 02/01/21 0508 02/02/21 0324 02/03/21 0137  WBC 9.8 10.3 11.1* 11.5*  NEUTROABS  --  8.4* 7.8* 9.9*  HGB 12.7 12.3 12.2 12.0  HCT 39.6 38.6 37.3 36.8  MCV 84.6 84.6 82.0 83.4  PLT 236 271 287 272   Cardiac Enzymes: No results for input(s): CKTOTAL, CKMB, CKMBINDEX, TROPONINI in the last 168 hours. BNP: Invalid input(s): POCBNP CBG: No results for input(s): GLUCAP in the last 168 hours. D-Dimer No results for input(s): DDIMER in the last 72 hours. Hgb A1c No results for input(s): HGBA1C in the last 72 hours. Lipid Profile No results for input(s): CHOL, HDL, LDLCALC, TRIG, CHOLHDL, LDLDIRECT in the last 72 hours. Thyroid function studies No results for input(s): TSH, T4TOTAL, T3FREE, THYROIDAB in the last 72 hours.  Invalid input(s): FREET3 Anemia work up No results for input(s): VITAMINB12, FOLATE, FERRITIN, TIBC, IRON, RETICCTPCT in the last 72 hours. Urinalysis    Component Value Date/Time   COLORURINE STRAW (A) 02/18/2019 1334   APPEARANCEUR CLEAR  02/18/2019 1334   LABSPEC 1.006 02/18/2019 1334   PHURINE 7.0 02/18/2019 1334   GLUCOSEU NEGATIVE 02/18/2019 1334   HGBUR NEGATIVE 02/18/2019 1334   BILIRUBINUR NEGATIVE 02/18/2019 1334   KETONESUR NEGATIVE 02/18/2019 1334   PROTEINUR NEGATIVE 02/18/2019 1334   UROBILINOGEN 0.2 09/25/2013 1322   NITRITE NEGATIVE 02/18/2019 1334   LEUKOCYTESUR NEGATIVE 02/18/2019 1334   Sepsis Labs Invalid input(s): PROCALCITONIN,  WBC,  LACTICIDVEN Microbiology Recent Results (from the past 240 hour(s))  Resp Panel  by RT-PCR (Flu A&B, Covid) Nasopharyngeal Swab     Status: None   Collection Time: 01/31/21  4:22 PM   Specimen: Nasopharyngeal Swab; Nasopharyngeal(NP) swabs in vial transport medium  Result Value Ref Range Status   SARS Coronavirus 2 by RT PCR NEGATIVE NEGATIVE Final    Comment: (NOTE) SARS-CoV-2 target nucleic acids are NOT DETECTED.  The SARS-CoV-2 RNA is generally detectable in upper respiratory specimens during the acute phase of infection. The lowest concentration of SARS-CoV-2 viral copies this assay can detect is 138 copies/mL. A negative result does not preclude SARS-Cov-2 infection and should not be used as the sole basis for treatment or other patient management decisions. A negative result may occur with  improper specimen collection/handling, submission of specimen other than nasopharyngeal swab, presence of viral mutation(s) within the areas targeted by this assay, and inadequate number of viral copies(<138 copies/mL). A negative result must be combined with clinical observations, patient history, and epidemiological information. The expected result is Negative.  Fact Sheet for Patients:  EntrepreneurPulse.com.au  Fact Sheet for Healthcare Providers:  IncredibleEmployment.be  This test is no t yet approved or cleared by the [place] FDA and  has been authorized for detection and/or diagnosis of SARS-CoV-2 by FDA under an  Emergency Use Authorization (EUA). This EUA will remain  in effect (meaning this test can be used) for the duration of the COVID-19 declaration under Section 564(b)(1) of the Act, 21 U.S.C.section 360bbb-3(b)(1), unless the authorization is terminated  or revoked sooner.       Influenza A by PCR NEGATIVE NEGATIVE Final   Influenza B by PCR NEGATIVE NEGATIVE Final    Comment: (NOTE) The Xpert Xpress SARS-CoV-2/FLU/RSV plus assay is intended as an aid in the diagnosis of influenza from Nasopharyngeal swab specimens and should not be used as a sole basis for treatment. Nasal washings and aspirates are unacceptable for Xpert Xpress SARS-CoV-2/FLU/RSV testing.  Fact Sheet for Patients: EntrepreneurPulse.com.au  Fact Sheet for Healthcare Providers: IncredibleEmployment.be  This test is not yet approved or cleared by the [place] FDA and has been authorized for detection and/or diagnosis of SARS-CoV-2 by FDA under an Emergency Use Authorization (EUA). This EUA will remain in effect (meaning this test can be used) for the duration of the COVID-19 declaration under Section 564(b)(1) of the Act, 21 U.S.C. section 360bbb-3(b)(1), unless the authorization is terminated or revoked.  Performed at [facility], [place] [address], [place], [place] [address]   Resp Panel by RT-PCR (Flu A&B, Covid) Nasopharyngeal Swab     Status: None   Collection Time: 02/03/21  1:54 PM   Specimen: Nasopharyngeal Swab; Nasopharyngeal(NP) swabs in vial transport medium  Result Value Ref Range Status   SARS Coronavirus 2 by RT PCR NEGATIVE NEGATIVE Final    Comment: (NOTE) SARS-CoV-2 target nucleic acids are NOT DETECTED.  The SARS-CoV-2 RNA is generally detectable in upper respiratory specimens during the acute phase of infection. The lowest concentration of SARS-CoV-2 viral copies this assay can detect is 138 copies/mL. A negative result does not  preclude SARS-Cov-2 infection and should not be used as the sole basis for treatment or other patient management decisions. A negative result may occur with  improper specimen collection/handling, submission of specimen other than nasopharyngeal swab, presence of viral mutation(s) within the areas targeted by this assay, and inadequate number of viral copies(<138 copies/mL). A negative result must be combined with clinical observations, patient history, and epidemiological information. The expected result is Negative.  Fact Sheet  for Patients:  EntrepreneurPulse.com.au  Fact Sheet for Healthcare Providers:  IncredibleEmployment.be  This test is no t yet approved or cleared by the [place] FDA and  has been authorized for detection and/or diagnosis of SARS-CoV-2 by FDA under an Emergency Use Authorization (EUA). This EUA will remain  in effect (meaning this test can be used) for the duration of the COVID-19 declaration under Section 564(b)(1) of the Act, 21 U.S.C.section 360bbb-3(b)(1), unless the authorization is terminated  or revoked sooner.       Influenza A by PCR NEGATIVE NEGATIVE Final   Influenza B by PCR NEGATIVE NEGATIVE Final    Comment: (NOTE) The Xpert Xpress SARS-CoV-2/FLU/RSV plus assay is intended as an aid in the diagnosis of influenza from Nasopharyngeal swab specimens and should not be used as a sole basis for treatment. Nasal washings and aspirates are unacceptable for Xpert Xpress SARS-CoV-2/FLU/RSV testing.  Fact Sheet for Patients: EntrepreneurPulse.com.au  Fact Sheet for Healthcare Providers: IncredibleEmployment.be  This test is not yet approved or cleared by the [place] FDA and has been authorized for detection and/or diagnosis of SARS-CoV-2 by FDA under an Emergency Use Authorization (EUA). This EUA will remain in effect (meaning this test can be used) for the  duration of the COVID-19 declaration under Section 564(b)(1) of the Act, 21 U.S.C. section 360bbb-3(b)(1), unless the authorization is terminated or revoked.  Performed at [facility], [place] [address], [place], [place] [address]      Time coordinating discharge: 38 minutes.   SIGNED:   Hosie Poisson, MD  [facility] 02/04/2021, 10:33 AM

## 2021-02-04 NOTE — Progress Notes (Signed)
Neurosurgery  Per discussion with palliative care, patient will be transitioning to hospice care.  No neurosurgical f/u required.  Can taper dexamethasone off.

## 2021-02-05 NOTE — Progress Notes (Signed)
Report given to RN receiving pt at [facility].

## 2021-04-26 DEATH — deceased
